# Patient Record
Sex: Male | Born: 1970 | Race: Black or African American | Hispanic: No | Marital: Married | Smoking: Current every day smoker
Health system: Southern US, Community
[De-identification: ages and names within clinical notes are randomized; demographics above are authoritative.]

## PROBLEM LIST (undated history)

## (undated) DIAGNOSIS — I10 Essential (primary) hypertension: Secondary | ICD-10-CM

## (undated) DIAGNOSIS — F1920 Other psychoactive substance dependence, uncomplicated: Secondary | ICD-10-CM

## (undated) DIAGNOSIS — F32A Depression, unspecified: Secondary | ICD-10-CM

## (undated) DIAGNOSIS — F329 Major depressive disorder, single episode, unspecified: Secondary | ICD-10-CM

## (undated) DIAGNOSIS — Z5900 Homelessness unspecified: Secondary | ICD-10-CM

## (undated) DIAGNOSIS — E079 Disorder of thyroid, unspecified: Secondary | ICD-10-CM

## (undated) DIAGNOSIS — M549 Dorsalgia, unspecified: Secondary | ICD-10-CM

## (undated) DIAGNOSIS — R45851 Suicidal ideations: Secondary | ICD-10-CM

## (undated) HISTORY — DX: Major depressive disorder, single episode, unspecified: F32.9

## (undated) HISTORY — DX: Depression, unspecified: F32.A

## (undated) HISTORY — PX: NO PAST SURGERIES: SHX2092

## (undated) HISTORY — DX: Essential (primary) hypertension: I10

---

## 2005-02-19 ENCOUNTER — Emergency Department (HOSPITAL_COMMUNITY): Admission: EM | Admit: 2005-02-19 | Discharge: 2005-02-19 | Payer: Self-pay | Admitting: Emergency Medicine

## 2005-03-18 ENCOUNTER — Ambulatory Visit: Payer: Self-pay | Admitting: Internal Medicine

## 2005-04-27 ENCOUNTER — Ambulatory Visit: Payer: Self-pay | Admitting: *Deleted

## 2005-04-27 ENCOUNTER — Ambulatory Visit: Payer: Self-pay | Admitting: Internal Medicine

## 2005-08-05 ENCOUNTER — Ambulatory Visit: Payer: Self-pay | Admitting: Family Medicine

## 2005-09-14 ENCOUNTER — Ambulatory Visit: Payer: Self-pay | Admitting: Internal Medicine

## 2005-12-06 ENCOUNTER — Ambulatory Visit: Payer: Self-pay | Admitting: Internal Medicine

## 2007-11-14 ENCOUNTER — Ambulatory Visit: Payer: Self-pay | Admitting: Internal Medicine

## 2008-02-13 ENCOUNTER — Ambulatory Visit: Payer: Self-pay | Admitting: Internal Medicine

## 2008-02-13 LAB — CONVERTED CEMR LAB: TSH: 13.719 microintl units/mL — ABNORMAL HIGH (ref 0.350–5.50)

## 2009-04-29 ENCOUNTER — Emergency Department (HOSPITAL_COMMUNITY): Admission: EM | Admit: 2009-04-29 | Discharge: 2009-04-29 | Payer: Self-pay | Admitting: Emergency Medicine

## 2009-09-11 ENCOUNTER — Ambulatory Visit: Payer: Self-pay | Admitting: Psychiatry

## 2009-09-11 ENCOUNTER — Emergency Department (HOSPITAL_COMMUNITY): Admission: EM | Admit: 2009-09-11 | Discharge: 2009-09-11 | Payer: Self-pay | Admitting: Emergency Medicine

## 2009-09-11 ENCOUNTER — Inpatient Hospital Stay (HOSPITAL_COMMUNITY): Admission: AD | Admit: 2009-09-11 | Discharge: 2009-09-14 | Payer: Self-pay | Admitting: Psychiatry

## 2010-02-24 ENCOUNTER — Ambulatory Visit: Payer: Self-pay | Admitting: Psychiatry

## 2010-02-24 ENCOUNTER — Emergency Department (HOSPITAL_COMMUNITY): Admission: EM | Admit: 2010-02-24 | Discharge: 2010-02-25 | Payer: Self-pay | Admitting: Emergency Medicine

## 2010-02-25 ENCOUNTER — Inpatient Hospital Stay (HOSPITAL_COMMUNITY): Admission: EM | Admit: 2010-02-25 | Discharge: 2010-03-01 | Payer: Self-pay | Admitting: Psychiatry

## 2010-06-25 IMAGING — CT CT HEAD W/O CM
1 series · 16 of 30 positions shown, 20 images · non-contrast
Comparison: None

CLINICAL DATA: Fell and hit head.

CT HEAD WITHOUT CONTRAST
TECHNIQUE: Contiguous axial images were obtained from the base of
the skull through the vertex without contrast.

[Series 2: head trauma 4.8 h37s · axial · 0.52mm/px · z∈[+42,+202]mm · 16 of 36 slices shown, 20 images]
[im 2/36  brain]
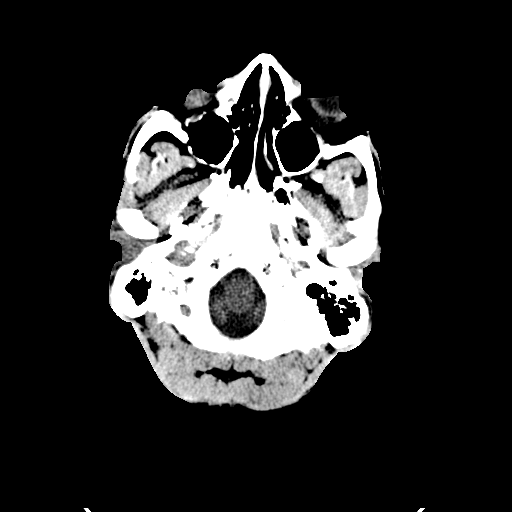
[im 2/36  bone]
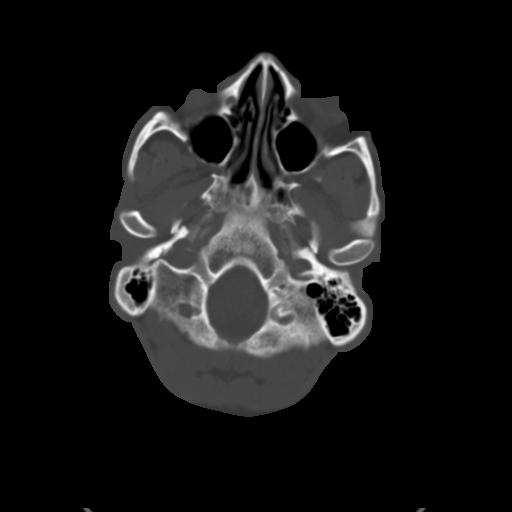
[im 4/36  brain]
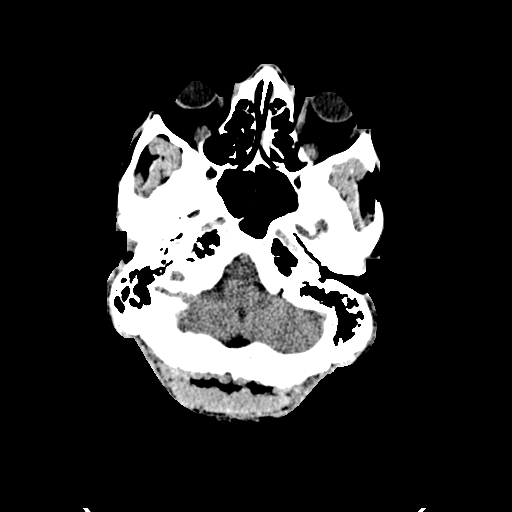
[im 7/36  brain]
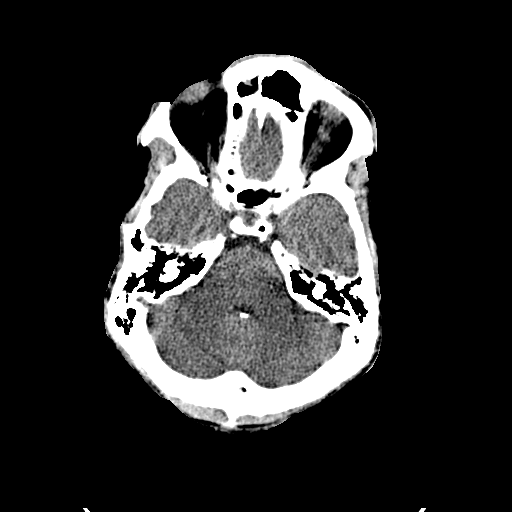
[im 9/36  brain]
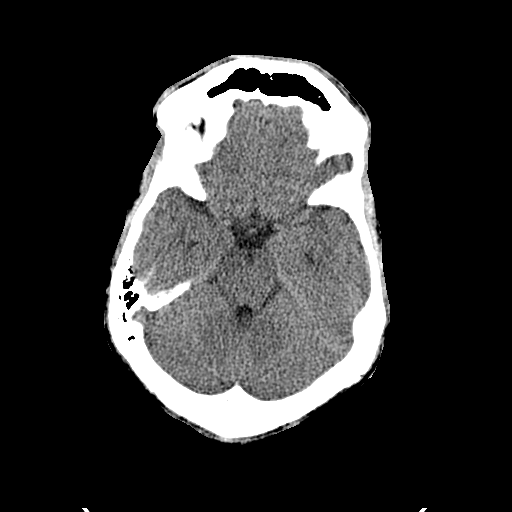
[im 10/36  brain]
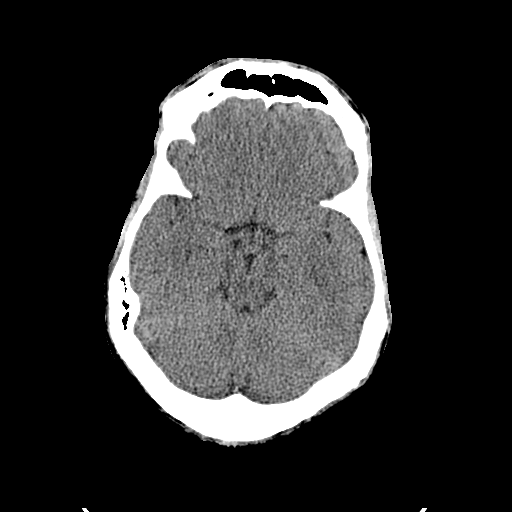
[im 10/36  bone]
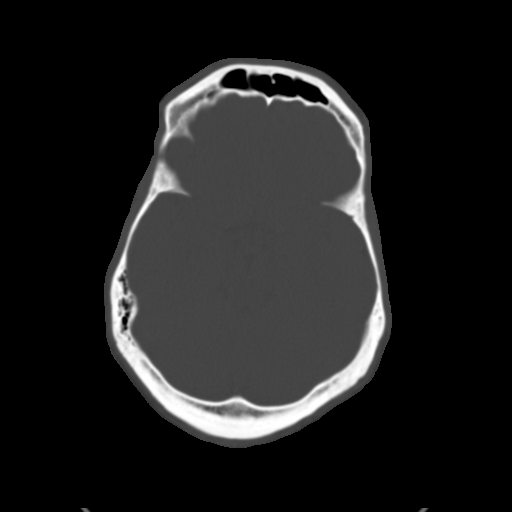
[im 13/36  brain]
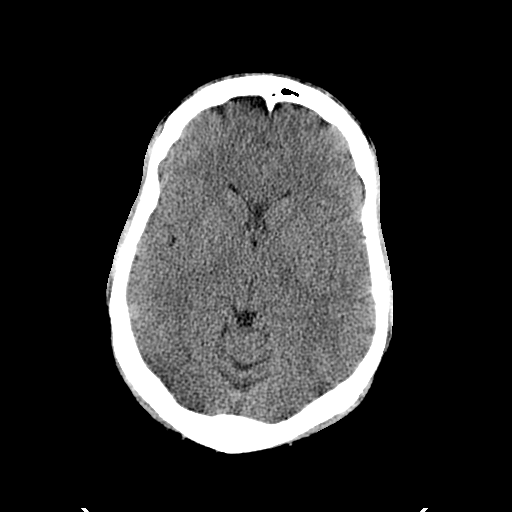
[im 15/36  brain]
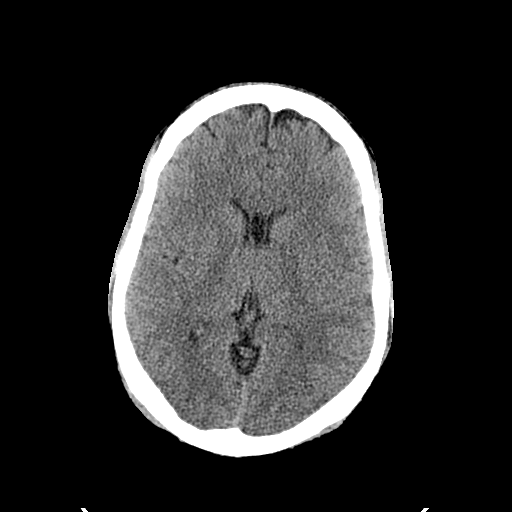
[im 17/36  brain]
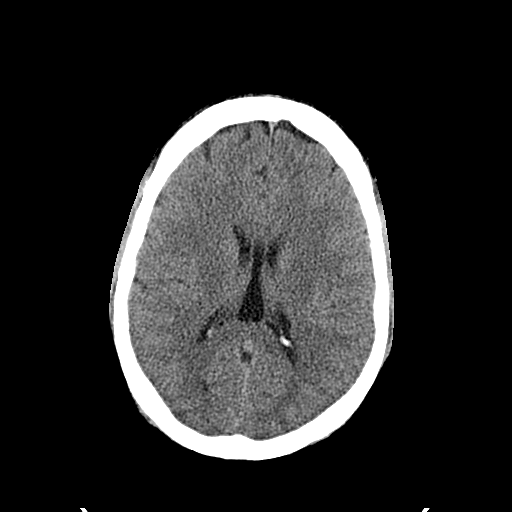
[im 19/36  brain]
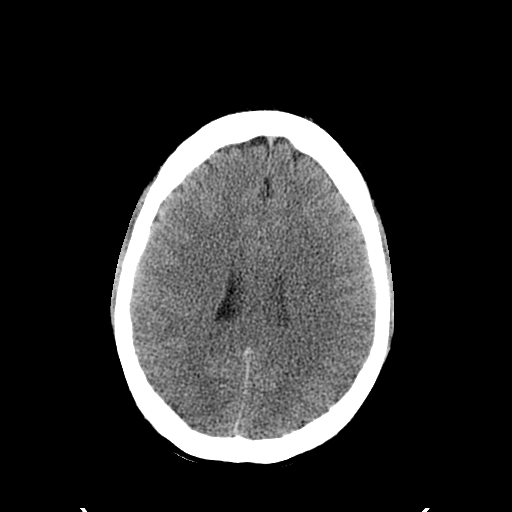
[im 19/36  bone]
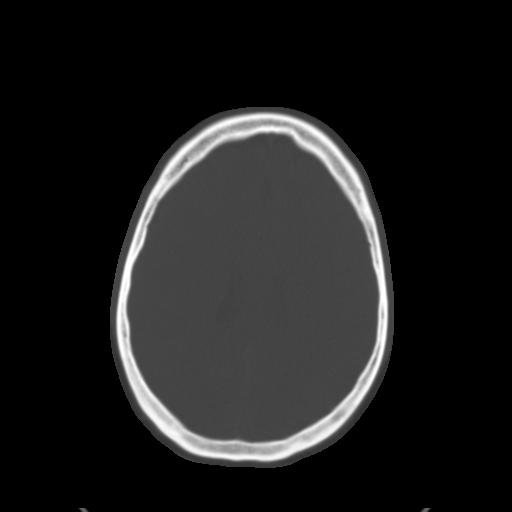
[im 21/36  brain]
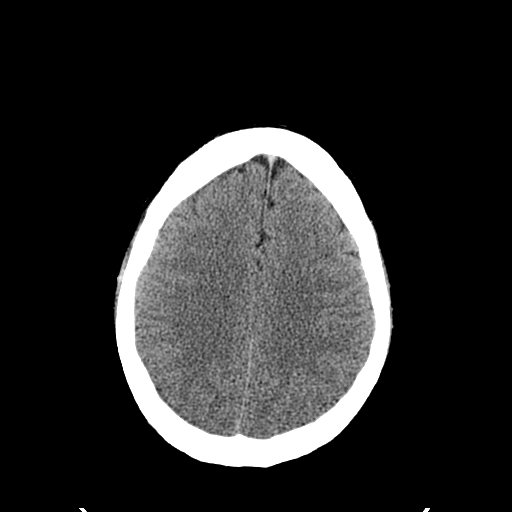
[im 23/36  brain]
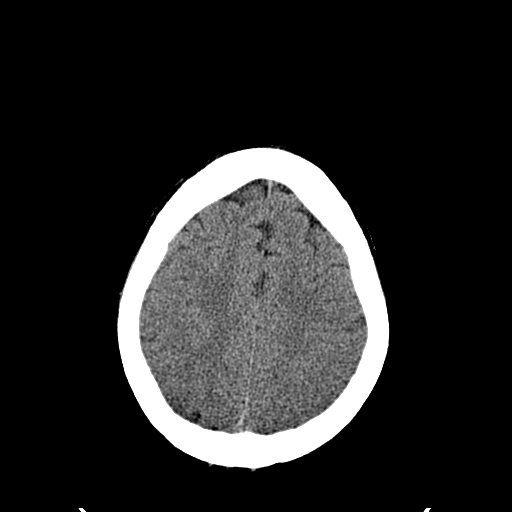
[im 26/36  brain]
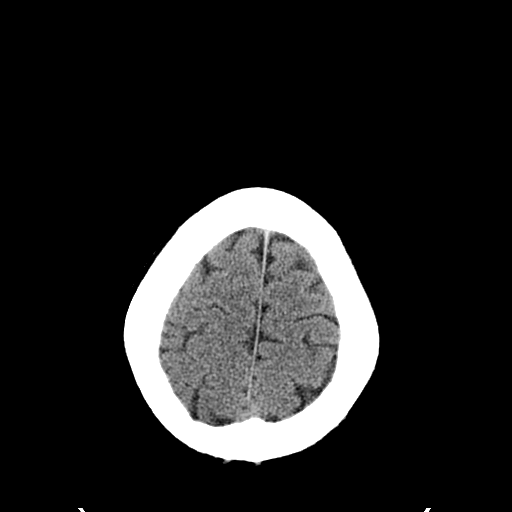
[im 27/36  brain]
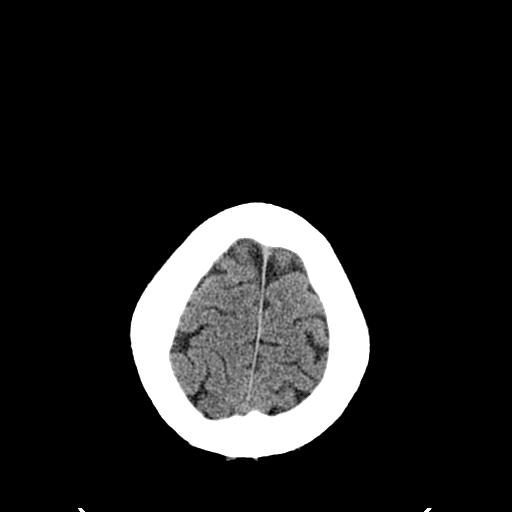
[im 27/36  bone]
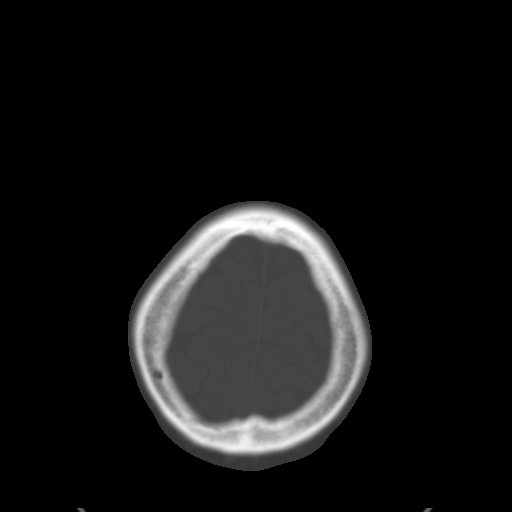
[im 29/36  brain]
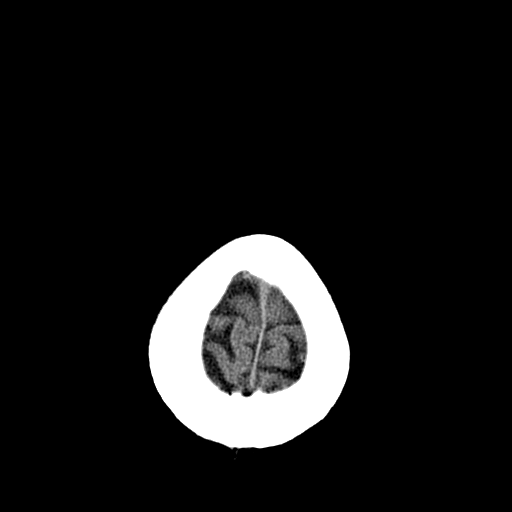
[im 32/36  brain]
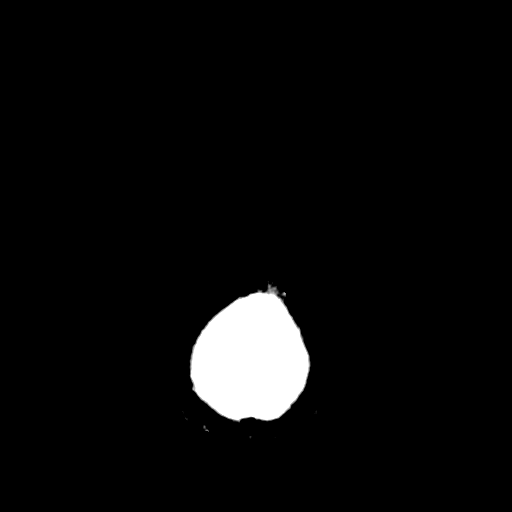
[im 34/36  brain]
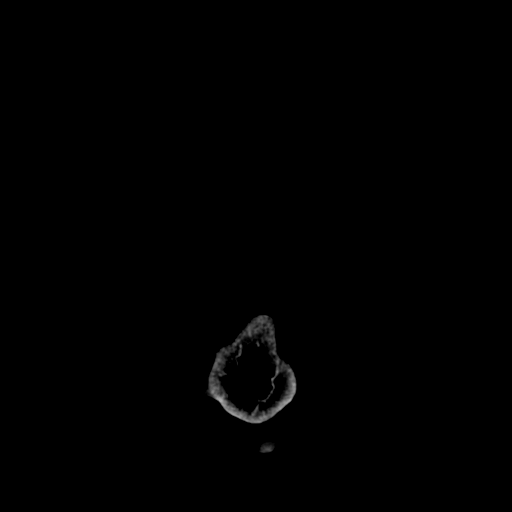

[16 of 30 positions shown; findings below may reference images not displayed]

FINDINGS: There is soft tissue swelling over the right orbit.  No
evidence of associated skull fracture.  The right orbital contents
appear normal.  The intraconal contents appears normal.

No evidence of acute intracranial hemorrhage.  No focal mass
lesion, midline shift or mass effect.  No hydrocephalus.  No
evidence of skull fracture.
IMPRESSION: 1.  No acute intracranial trauma.
2.  Small hematoma above the right orbit.

## 2010-08-12 ENCOUNTER — Encounter (INDEPENDENT_AMBULATORY_CARE_PROVIDER_SITE_OTHER): Payer: Self-pay | Admitting: Family Medicine

## 2010-08-12 LAB — CONVERTED CEMR LAB
Amphetamine Screen, Ur: NEGATIVE
Benzodiazepines.: NEGATIVE
Free T4: 0.21 ng/dL — ABNORMAL LOW (ref 0.80–1.80)
Phencyclidine (PCP): NEGATIVE
TSH: 49.117 microintl units/mL — ABNORMAL HIGH (ref 0.350–4.500)

## 2010-09-23 ENCOUNTER — Encounter (INDEPENDENT_AMBULATORY_CARE_PROVIDER_SITE_OTHER): Payer: Self-pay | Admitting: *Deleted

## 2010-09-23 LAB — CONVERTED CEMR LAB: TSH: 3.069 microintl units/mL (ref 0.350–4.500)

## 2011-01-25 LAB — URINALYSIS, ROUTINE W REFLEX MICROSCOPIC
Glucose, UA: NEGATIVE mg/dL
Leukocytes, UA: NEGATIVE
Nitrite: NEGATIVE

## 2011-01-25 LAB — BASIC METABOLIC PANEL
BUN: 14 mg/dL (ref 6–23)
CO2: 27 mEq/L (ref 19–32)
GFR calc non Af Amer: 52 mL/min — ABNORMAL LOW (ref >60)

## 2011-01-25 LAB — RAPID URINE DRUG SCREEN, HOSP PERFORMED
Amphetamines: NOT DETECTED
Barbiturates: NOT DETECTED
Opiates: NOT DETECTED

## 2011-01-25 LAB — CBC
HCT: 39.3 % (ref 39.0–52.0)
Hemoglobin: 13.3 g/dL (ref 13.0–17.0)
MCHC: 33.9 g/dL (ref 30.0–36.0)
MCV: 104.9 fL — ABNORMAL HIGH (ref 78.0–100.0)
Platelets: 214 10*3/uL (ref 150–400)
WBC: 13.9 10*3/uL — ABNORMAL HIGH (ref 4.0–10.5)

## 2011-01-25 LAB — DIFFERENTIAL
Basophils Absolute: 0 10*3/uL (ref 0.0–0.1)
Basophils Relative: 0 % (ref 0–1)
Eosinophils Absolute: 0 10*3/uL (ref 0.0–0.7)
Lymphocytes Relative: 12 % (ref 12–46)
Monocytes Absolute: 0.3 10*3/uL (ref 0.1–1.0)

## 2011-01-25 LAB — TSH: TSH: 25.34 u[IU]/mL — ABNORMAL HIGH (ref 0.350–4.500)

## 2011-01-25 LAB — URINE MICROSCOPIC-ADD ON

## 2011-01-25 LAB — VITAMIN B12: Vitamin B-12: 361 pg/mL (ref 211–911)

## 2011-01-25 LAB — VALPROIC ACID LEVEL: Valproic Acid Lvl: 81.6 ug/mL (ref 50.0–100.0)

## 2011-01-25 LAB — ETHANOL: Alcohol, Ethyl (B): <5 mg/dL (ref 0–10)

## 2011-01-25 LAB — GLUCOSE, CAPILLARY: Glucose-Capillary: 104 mg/dL — ABNORMAL HIGH (ref 70–99)

## 2011-02-09 LAB — DIFFERENTIAL
Eosinophils Absolute: 0.1 10*3/uL (ref 0.0–0.7)
Lymphocytes Relative: 24 % (ref 12–46)
Lymphs Abs: 2.3 10*3/uL (ref 0.7–4.0)
Monocytes Relative: 4 % (ref 3–12)
Neutro Abs: 6.7 10*3/uL (ref 1.7–7.7)

## 2011-02-09 LAB — CBC
HCT: 37.8 % — ABNORMAL LOW (ref 39.0–52.0)
MCHC: 34.5 g/dL (ref 30.0–36.0)
MCV: 104.5 fL — ABNORMAL HIGH (ref 78.0–100.0)
RBC: 3.62 MIL/uL — ABNORMAL LOW (ref 4.22–5.81)
RDW: 13.6 % (ref 11.5–15.5)
WBC: 9.4 10*3/uL (ref 4.0–10.5)

## 2011-02-09 LAB — RAPID URINE DRUG SCREEN, HOSP PERFORMED: Benzodiazepines: NOT DETECTED

## 2011-02-09 LAB — T3, FREE: T3, Free: 2.2 pg/mL — ABNORMAL LOW (ref 2.3–4.2)

## 2011-02-09 LAB — BASIC METABOLIC PANEL
BUN: 14 mg/dL (ref 6–23)
Calcium: 9.5 mg/dL (ref 8.4–10.5)

## 2011-02-09 LAB — TSH: TSH: 12.915 u[IU]/mL — ABNORMAL HIGH (ref 0.350–4.500)

## 2011-02-14 LAB — DIFFERENTIAL
Eosinophils Absolute: 0.2 10*3/uL (ref 0.0–0.7)
Lymphs Abs: 1.5 10*3/uL (ref 0.7–4.0)
Monocytes Relative: 5 % (ref 3–12)
Neutrophils Relative %: 73 % (ref 43–77)

## 2011-02-14 LAB — POCT CARDIAC MARKERS
CKMB, poc: 3 ng/mL (ref 1.0–8.0)
Troponin i, poc: <0.05 ng/mL (ref 0.00–0.09)

## 2011-02-14 LAB — CBC
Hemoglobin: 13.1 g/dL (ref 13.0–17.0)
MCHC: 34.5 g/dL (ref 30.0–36.0)
MCV: 103.3 fL — ABNORMAL HIGH (ref 78.0–100.0)
RBC: 3.67 MIL/uL — ABNORMAL LOW (ref 4.22–5.81)

## 2011-02-14 LAB — POCT I-STAT, CHEM 8
Creatinine, Ser: 2 mg/dL — ABNORMAL HIGH (ref 0.4–1.5)
Glucose, Bld: 76 mg/dL (ref 70–99)
Hemoglobin: 13.3 g/dL (ref 13.0–17.0)
Potassium: 4.2 mEq/L (ref 3.5–5.1)

## 2011-08-15 ENCOUNTER — Emergency Department (HOSPITAL_COMMUNITY)
Admission: EM | Admit: 2011-08-15 | Discharge: 2011-08-15 | Disposition: A | Discharge status: Home / Self Care | Payer: Self-pay | Attending: Emergency Medicine | Admitting: Emergency Medicine

## 2011-08-15 DIAGNOSIS — L851 Acquired keratosis [keratoderma] palmaris et plantaris: Secondary | ICD-10-CM | POA: Insufficient documentation

## 2011-08-15 DIAGNOSIS — N289 Disorder of kidney and ureter, unspecified: Secondary | ICD-10-CM | POA: Insufficient documentation

## 2011-08-15 DIAGNOSIS — I10 Essential (primary) hypertension: Secondary | ICD-10-CM | POA: Insufficient documentation

## 2011-08-15 DIAGNOSIS — E039 Hypothyroidism, unspecified: Secondary | ICD-10-CM | POA: Insufficient documentation

## 2011-08-15 LAB — RAPID STREP SCREEN (MED CTR MEBANE ONLY): Streptococcus, Group A Screen (Direct): NEGATIVE

## 2011-08-15 LAB — DIFFERENTIAL
Basophils Absolute: 0 10*3/uL (ref 0.0–0.1)
Lymphocytes Relative: 55 % — ABNORMAL HIGH (ref 12–46)
Neutro Abs: 1.6 10*3/uL — ABNORMAL LOW (ref 1.7–7.7)
Neutrophils Relative %: 33 % — ABNORMAL LOW (ref 43–77)

## 2011-08-15 LAB — CBC
HCT: 39.4 % (ref 39.0–52.0)
Hemoglobin: 13.4 g/dL (ref 13.0–17.0)
WBC: 5 10*3/uL (ref 4.0–10.5)

## 2011-08-15 LAB — URINALYSIS, ROUTINE W REFLEX MICROSCOPIC
Glucose, UA: NEGATIVE mg/dL
Hgb urine dipstick: NEGATIVE
Leukocytes, UA: NEGATIVE
Specific Gravity, Urine: 1.026 (ref 1.005–1.030)
Urobilinogen, UA: 1 mg/dL (ref 0.0–1.0)

## 2011-08-15 LAB — POCT I-STAT, CHEM 8
Creatinine, Ser: 1.5 mg/dL — ABNORMAL HIGH (ref 0.50–1.35)
HCT: 42 % (ref 39.0–52.0)
Hemoglobin: 14.3 g/dL (ref 13.0–17.0)
Potassium: 3.8 mEq/L (ref 3.5–5.1)
Sodium: 140 mEq/L (ref 135–145)

## 2011-08-15 LAB — RAPID URINE DRUG SCREEN, HOSP PERFORMED
Amphetamines: NOT DETECTED
Cocaine: NOT DETECTED
Opiates: NOT DETECTED
Tetrahydrocannabinol: POSITIVE — AB

## 2011-08-16 LAB — URINE CULTURE
Culture  Setup Time: 201210081053
Culture: NO GROWTH

## 2013-03-08 ENCOUNTER — Emergency Department (HOSPITAL_COMMUNITY): Payer: Self-pay

## 2013-03-08 ENCOUNTER — Encounter (HOSPITAL_COMMUNITY): Payer: Self-pay | Admitting: *Deleted

## 2013-03-08 ENCOUNTER — Other Ambulatory Visit: Payer: Self-pay

## 2013-03-08 ENCOUNTER — Emergency Department (HOSPITAL_COMMUNITY)
Admission: EM | Admit: 2013-03-08 | Discharge: 2013-03-08 | Disposition: A | Discharge status: Home / Self Care | Payer: Self-pay | Attending: Emergency Medicine | Admitting: Emergency Medicine

## 2013-03-08 DIAGNOSIS — R0602 Shortness of breath: Secondary | ICD-10-CM | POA: Insufficient documentation

## 2013-03-08 DIAGNOSIS — E079 Disorder of thyroid, unspecified: Secondary | ICD-10-CM | POA: Insufficient documentation

## 2013-03-08 DIAGNOSIS — R079 Chest pain, unspecified: Secondary | ICD-10-CM | POA: Insufficient documentation

## 2013-03-08 DIAGNOSIS — Z79899 Other long term (current) drug therapy: Secondary | ICD-10-CM | POA: Insufficient documentation

## 2013-03-08 DIAGNOSIS — F17200 Nicotine dependence, unspecified, uncomplicated: Secondary | ICD-10-CM | POA: Insufficient documentation

## 2013-03-08 HISTORY — DX: Dorsalgia, unspecified: M54.9

## 2013-03-08 HISTORY — DX: Disorder of thyroid, unspecified: E07.9

## 2013-03-08 LAB — BASIC METABOLIC PANEL
BUN: 18 mg/dL (ref 6–23)
Chloride: 104 mEq/L (ref 96–112)
GFR calc Af Amer: >90 mL/min (ref >90)
GFR calc non Af Amer: 86 mL/min — ABNORMAL LOW (ref >90)
Potassium: 4.1 mEq/L (ref 3.5–5.1)
Sodium: 140 mEq/L (ref 135–145)

## 2013-03-08 LAB — POCT I-STAT TROPONIN I
Troponin i, poc: 0 ng/mL (ref 0.00–0.08)
Troponin i, poc: 0 ng/mL (ref 0.00–0.08)

## 2013-03-08 LAB — CBC
HCT: 37.5 % — ABNORMAL LOW (ref 39.0–52.0)
MCHC: 35.2 g/dL (ref 30.0–36.0)
RDW: 11.7 % (ref 11.5–15.5)
WBC: 3.9 10*3/uL — ABNORMAL LOW (ref 4.0–10.5)

## 2013-03-08 LAB — PRO B NATRIURETIC PEPTIDE: Pro B Natriuretic peptide (BNP): 7.7 pg/mL (ref 0–125)

## 2013-03-08 MED ORDER — PANTOPRAZOLE SODIUM 40 MG PO TBEC
40.0000 mg | DELAYED_RELEASE_TABLET | Freq: Once | ORAL | Status: AC
  Administered 2013-03-08: 40 mg via ORAL
  Filled 2013-03-08: qty 1

## 2013-03-08 MED ORDER — OMEPRAZOLE 20 MG PO CPDR: 40.0000 mg | DELAYED_RELEASE_CAPSULE | Freq: Every day | ORAL | Status: DC

## 2013-03-08 NOTE — ED Notes (Signed)
Pt dc to home. Pt sts understanding to dc instructions. Pt ambulatory to exit without difficulty.  Pt denies need for w/c.  

## 2013-03-08 NOTE — ED Provider Notes (Signed)
History     CSN: [id number]  Arrival date & time 03/08/13  1254   First MD Initiated Contact with Patient 03/08/13 1454      Chief Complaint  Patient presents with  . Chest Pain    (Consider location/radiation/quality/duration/timing/severity/associated sxs/prior treatment) HPI Comments: Patient presents with chest pain. He has had one episode last night and one episode today. He describes it as a soreness in his throat it radiates into the center of his chest. He states that he's recently started eating late at night and the pain tends to wake him up when he is sleeping. He states he gets better when he sits up. He states that he feels slightly short of breath when it happens and he gets a little clammy feeling but no actual diaphoresis.he states that the episodes have been going on for about a month. He states that each episode lasts only about 15-20 seconds. He states is relieved when he sits up. He feels like he might be related to his change in eating habits. He denies any shortness of breath or chest pain on exertion. He denies any past history of heart problems. He denies a family history of early heart disease. He denies any hypertension hyperlipidemia or diabetes. He does smoke cigarettes.  Patient is a 42 y.o. male presenting with chest pain.  Chest Pain Associated symptoms: shortness of breath   Associated symptoms: no abdominal pain, no back pain, no cough, no diaphoresis, no dizziness, no fatigue, no fever, no headache, no nausea, no numbness, not vomiting and no weakness     Past Medical History  Diagnosis Date  . Thyroid disease   . Back pain     History reviewed. No pertinent past surgical history.  History reviewed. No pertinent family history.  History  Substance Use Topics  . Smoking status: Current Every Day Smoker    Types: Cigarettes  . Smokeless tobacco: Not on file  . Alcohol Use: No      Review of Systems  Constitutional: Negative for fever, chills,  diaphoresis and fatigue.  HENT: Negative for congestion, rhinorrhea and sneezing.   Eyes: Negative.   Respiratory: Positive for shortness of breath. Negative for cough and chest tightness.   Cardiovascular: Positive for chest pain. Negative for leg swelling.  Gastrointestinal: Negative for nausea, vomiting, abdominal pain, diarrhea and blood in stool.  Genitourinary: Negative for frequency, hematuria, flank pain and difficulty urinating.  Musculoskeletal: Negative for back pain and arthralgias.  Skin: Negative for rash.  Neurological: Negative for dizziness, speech difficulty, weakness, numbness and headaches.    Allergies  Review of patient's allergies indicates no known allergies.  Home Medications   Current Outpatient Rx  Name  Route  Sig  Dispense  Refill  . GABAPENTIN PO   Oral   Take 1 capsule by mouth 3 (three) times daily as needed (for back pain).          Marland Kitchen levothyroxine (SYNTHROID, LEVOTHROID) 200 MCG tablet   Oral   Take 200 mcg by mouth daily before breakfast.         . omeprazole (PRILOSEC) 20 MG capsule   Oral   Take 2 capsules (40 mg total) by mouth daily.   30 capsule   0     BP 126/78  Pulse 57  Temp(Src) 97.4 F (36.3 C) (Oral)  Resp 15  SpO2 100%  Physical Exam  Constitutional: He is oriented to person, place, and time. He appears well-developed and well-nourished.  HENT:  Head: Normocephalic and atraumatic.  Eyes: Pupils are equal, round, and reactive to light.  Neck: Normal range of motion. Neck supple.  Cardiovascular: Normal rate, regular rhythm and normal heart sounds.   Pulmonary/Chest: Effort normal and breath sounds normal. No respiratory distress. He has no wheezes. He has no rales. He exhibits no tenderness.  Abdominal: Soft. Bowel sounds are normal. There is no tenderness. There is no rebound and no guarding.  Musculoskeletal: Normal range of motion. He exhibits no edema and no tenderness.  Lymphadenopathy:    He has no cervical  adenopathy.  Neurological: He is alert and oriented to person, place, and time.  Skin: Skin is warm and dry. No rash noted.  Psychiatric: He has a normal mood and affect.    ED Course  Procedures (including critical care time)  Results for orders placed during the hospital encounter of 03/08/13  CBC      Result Value Range   WBC 3.9 (*) 4.0 - 10.5 K/uL   RBC 3.78 (*) 4.22 - 5.81 MIL/uL   Hemoglobin 13.2  13.0 - 17.0 g/dL   HCT 16.1 (*) 09.6 - 04.5 %   MCV 99.2  78.0 - 100.0 fL   MCH 34.9 (*) 26.0 - 34.0 pg   MCHC 35.2  30.0 - 36.0 g/dL   RDW 40.9  81.1 - 91.4 %   Platelets 210  150 - 400 K/uL  BASIC METABOLIC PANEL      Result Value Range   Sodium 140  135 - 145 mEq/L   Potassium 4.1  3.5 - 5.1 mEq/L   Chloride 104  96 - 112 mEq/L   CO2 25  19 - 32 mEq/L   Glucose, Bld 101 (*) 70 - 99 mg/dL   BUN 18  6 - 23 mg/dL   Creatinine, Ser 7.82  0.50 - 1.35 mg/dL   Calcium 9.8  8.4 - 95.6 mg/dL   GFR calc non Af Amer 86 (*) >90 mL/min   GFR calc Af Amer >90  >90 mL/min  PRO B NATRIURETIC PEPTIDE      Result Value Range   Pro B Natriuretic peptide (BNP) 7.7  0 - 125 pg/mL  POCT I-STAT TROPONIN I      Result Value Range   Troponin i, poc 0.00  0.00 - 0.08 ng/mL   Comment 3           POCT I-STAT TROPONIN I      Result Value Range   Troponin i, poc 0.00  0.00 - 0.08 ng/mL   Comment 3            Dg Chest 2 View  03/08/2013  *RADIOLOGY REPORT*  Clinical Data: Chest pain.  CHEST - 2 VIEW  Comparison: None.  Findings: Cardiomediastinal silhouette appears normal.  No acute pulmonary disease is noted.  Bony thorax is intact.  IMPRESSION: No acute cardiopulmonary abnormality seen.   Original Report Authenticated By: Lupita Raider.,  M.D.      Date: 03/08/2013  Rate: 75  Rhythm: normal sinus rhythm  QRS Axis: normal  Intervals: normal  ST/T Wave abnormalities: normal  Conduction Disutrbances:none  Narrative Interpretation:   Old EKG Reviewed: none available    1. Chest pain        MDM  Patient symptoms sound suggestive of gastroesophageal reflux disease. His discomfort is worse on lying down and better sitting up. His symptoms only last 15-20 seconds and he has no other suggestions of acute coronary syndrome. He's  had 2 negative troponins. His EKG did not show ischemic changes. I will go ahead and start him on an H2 blocker and I did advise him to have close followup with his primary care physician. He does have a primary care physician. I advised to followup on Monday or return here if his symptoms worsen over the weekend.        Rolan Bucco, MD 03/08/13 [phone]

## 2013-03-08 NOTE — ED Notes (Signed)
Pt reports episodes of cp over the past month, became more frequent yesterday. Having mid chest pressure, sob and diaphoresis. ekg being done at triage. No acute distress noted at this time.

## 2013-03-08 NOTE — ED Notes (Signed)
Lab at bedside

## 2014-02-10 ENCOUNTER — Ambulatory Visit: Payer: Self-pay | Admitting: Family Medicine

## 2014-02-11 ENCOUNTER — Telehealth: Payer: Self-pay

## 2014-02-11 ENCOUNTER — Ambulatory Visit (INDEPENDENT_AMBULATORY_CARE_PROVIDER_SITE_OTHER): Payer: PRIVATE HEALTH INSURANCE | Admitting: Family Medicine

## 2014-02-11 VITALS — BP 140/96 | HR 59 | Temp 97.6°F | Resp 18 | Ht 76.0 in | Wt 220.0 lb

## 2014-02-11 DIAGNOSIS — E039 Hypothyroidism, unspecified: Secondary | ICD-10-CM

## 2014-02-11 DIAGNOSIS — Z0000 Encounter for general adult medical examination without abnormal findings: Secondary | ICD-10-CM

## 2014-02-11 DIAGNOSIS — M5431 Sciatica, right side: Secondary | ICD-10-CM

## 2014-02-11 DIAGNOSIS — R21 Rash and other nonspecific skin eruption: Secondary | ICD-10-CM

## 2014-02-11 LAB — POCT CBC
Granulocyte percent: 46 %G (ref 37–80)
HCT, POC: 45.5 % (ref 43.5–53.7)
Hemoglobin: 14.4 g/dL (ref 14.1–18.1)
Lymph, poc: 1.9 (ref 0.6–3.4)
MCH, POC: 33.7 pg — AB (ref 27–31.2)
MCHC: 31.6 g/dL — AB (ref 31.8–35.4)
MCV: 106.6 fL — AB (ref 80–97)
MID (cbc): 0.4 (ref 0–0.9)
MPV: 9.5 fL (ref 0–99.8)
POC Granulocyte: 2 (ref 2–6.9)
POC LYMPH PERCENT: 45.3 %L (ref 10–50)
POC MID %: 8.7 %M (ref 0–12)
Platelet Count, POC: 223 10*3/uL (ref 142–424)
RBC: 4.27 M/uL — AB (ref 4.69–6.13)
RDW, POC: 11.9 %
WBC: 4.3 10*3/uL — AB (ref 4.6–10.2)

## 2014-02-11 LAB — COMPREHENSIVE METABOLIC PANEL
ALT: 22 U/L (ref 0–53)
AST: 30 U/L (ref 0–37)
Albumin: 5 g/dL (ref 3.5–5.2)
Alkaline Phosphatase: 74 U/L (ref 39–117)
BUN: 15 mg/dL (ref 6–23)
CO2: 28 mEq/L (ref 19–32)
Calcium: 10.2 mg/dL (ref 8.4–10.5)
Chloride: 101 mEq/L (ref 96–112)
Creat: 1.05 mg/dL (ref 0.50–1.35)
Glucose, Bld: 86 mg/dL (ref 70–99)
Potassium: 4 mEq/L (ref 3.5–5.3)
Sodium: 137 mEq/L (ref 135–145)
Total Bilirubin: 0.7 mg/dL (ref 0.2–1.2)
Total Protein: 7.5 g/dL (ref 6.0–8.3)

## 2014-02-11 LAB — POCT URINALYSIS DIPSTICK
Bilirubin, UA: NEGATIVE
Blood, UA: NEGATIVE
Glucose, UA: NEGATIVE
Ketones, UA: NEGATIVE
Leukocytes, UA: NEGATIVE
Nitrite, UA: NEGATIVE
Protein, UA: NEGATIVE
Spec Grav, UA: 1.015
Urobilinogen, UA: 0.2
pH, UA: 6

## 2014-02-11 LAB — LIPID PANEL
Cholesterol: 165 mg/dL (ref 0–200)
HDL: 76 mg/dL (ref >39)
LDL Cholesterol: 73 mg/dL (ref 0–99)
Total CHOL/HDL Ratio: 2.2 Ratio
Triglycerides: 80 mg/dL (ref <150)
VLDL: 16 mg/dL (ref 0–40)

## 2014-02-11 MED ORDER — PREDNISONE 20 MG PO TABS: ORAL_TABLET | ORAL | Status: DC

## 2014-02-11 NOTE — Patient Instructions (Addendum)
Sciatica Sciatica is pain, weakness, numbness, or tingling along the path of the sciatic nerve. The nerve starts in the lower back and runs down the back of each leg. The nerve controls the muscles in the lower leg and in the back of the knee, while also providing sensation to the back of the thigh, lower leg, and the sole of your foot. Sciatica is a symptom of another medical condition. For instance, nerve damage or certain conditions, such as a herniated disk or bone spur on the spine, pinch or put pressure on the sciatic nerve. This causes the pain, weakness, or other sensations normally associated with sciatica. Generally, sciatica only affects one side of the body. CAUSES   Herniated or slipped disc.  Degenerative disk disease.  A pain disorder involving the narrow muscle in the buttocks (piriformis syndrome).  Pelvic injury or fracture.  Pregnancy.  Tumor (rare). SYMPTOMS  Symptoms can vary from mild to very severe. The symptoms usually travel from the low back to the buttocks and down the back of the leg. Symptoms can include:  Mild tingling or dull aches in the lower back, leg, or hip.  Numbness in the back of the calf or sole of the foot.  Burning sensations in the lower back, leg, or hip.  Sharp pains in the lower back, leg, or hip.  Leg weakness.  Severe back pain inhibiting movement. These symptoms may get worse with coughing, sneezing, laughing, or prolonged sitting or standing. Also, being overweight may worsen symptoms. DIAGNOSIS  Your caregiver will perform a physical exam to look for common symptoms of sciatica. He or she may ask you to do certain movements or activities that would trigger sciatic nerve pain. Other tests may be performed to find the cause of the sciatica. These may include:  Blood tests.  X-rays.  Imaging tests, such as an MRI or CT scan. TREATMENT  Treatment is directed at the cause of the sciatic pain. Sometimes, treatment is not necessary  and the pain and discomfort goes away on its own. If treatment is needed, your caregiver may suggest:  Over-the-counter medicines to relieve pain.  Prescription medicines, such as anti-inflammatory medicine, muscle relaxants, or narcotics.  Applying heat or ice to the painful area.  Steroid injections to lessen pain, irritation, and inflammation around the nerve.  Reducing activity during periods of pain.  Exercising and stretching to strengthen your abdomen and improve flexibility of your spine. Your caregiver may suggest losing weight if the extra weight makes the back pain worse.  Physical therapy.  Surgery to eliminate what is pressing or pinching the nerve, such as a bone spur or part of a herniated disk. HOME CARE INSTRUCTIONS   Only take over-the-counter or prescription medicines for pain or discomfort as directed by your caregiver.  Apply ice to the affected area for 20 minutes, 3 4 times a day for the first 48 72 hours. Then try heat in the same way.  Exercise, stretch, or perform your usual activities if these do not aggravate your pain.  Attend physical therapy sessions as directed by your caregiver.  Keep all follow-up appointments as directed by your caregiver.  Do not wear high heels or shoes that do not provide proper support.  Check your mattress to see if it is too soft. A firm mattress may lessen your pain and discomfort. SEEK IMMEDIATE MEDICAL CARE IF:   You lose control of your bowel or bladder (incontinence).  You have increasing weakness in the lower back,   pelvis, buttocks, or legs.  You have redness or swelling of your back.  You have a burning sensation when you urinate.  You have pain that gets worse when you lie down or awakens you at night.  Your pain is worse than you have experienced in the past.  Your pain is lasting longer than 4 weeks.  You are suddenly losing weight without reason. MAKE SURE YOU:  Understand these  instructions.  Will watch your condition.  Will get help right away if you are not doing well or get worse. Document Released: 10/18/2001 Document Revised: 04/24/2012 Document Reviewed: 03/04/2012 [facility] Patient Information 2014 [place]. Health Maintenance, Males A healthy lifestyle and preventative care can promote health and wellness.  Maintain regular health, dental, and eye exams.  Eat a healthy diet. Foods like vegetables, fruits, whole grains, low-fat dairy products, and lean protein foods contain the nutrients you need and are low in calories. Decrease your intake of foods high in solid fats, added sugars, and salt. Get information about a proper diet from your health care provider, if necessary.  Regular physical exercise is one of the most important things you can do for your health. Most adults should get at least 150 minutes of moderate-intensity exercise (any activity that increases your heart rate and causes you to sweat) each week. In addition, most adults need muscle-strengthening exercises on 2 or more days a week.   Maintain a healthy weight. The body mass index (BMI) is a screening tool to identify possible weight problems. It provides an estimate of body fat based on height and weight. Your health care provider can find your BMI and can help you achieve or maintain a healthy weight. For males 20 years and older:  A BMI below 18.5 is considered underweight.  A BMI of 18.5 to 24.9 is normal.  A BMI of 25 to 29.9 is considered overweight.  A BMI of 30 and above is considered obese.  Maintain normal blood lipids and cholesterol by exercising and minimizing your intake of saturated fat. Eat a balanced diet with plenty of fruits and vegetables. Blood tests for lipids and cholesterol should begin at age 15 and be repeated every 5 years. If your lipid or cholesterol levels are high, you are over 50, or you are at high risk for heart disease, you may need your  cholesterol levels checked more frequently.Ongoing high lipid and cholesterol levels should be treated with medicines, if diet and exercise are not working.  If you smoke, find out from your health care provider how to quit. If you do not use tobacco, do not start.  Lung cancer screening is recommended for adults aged 42 80 years who are at high risk for developing lung cancer because of a history of smoking. A yearly low-dose CT scan of the lungs is recommended for people who have at least a 30-pack-year history of smoking and are a current smoker or have quit within the past 15 years. A pack year of smoking is smoking an average of 1 pack of cigarettes a day for 1 year (for example, a 30-pack-year history of smoking could mean smoking 1 pack a day for 30 years or 2 packs a day for 15 years). Yearly screening should continue until the smoker has stopped smoking for at least 15 years. Yearly screening should be stopped for people who develop a health problem that would prevent them from having lung cancer treatment.  If you choose to drink alcohol, do not have  more than 2 drinks per day. One drink is considered to be 12 oz (360 mL) of beer, 5 oz (150 mL) of wine, or 1.5 oz (45 mL) of liquor.  Avoid use of street drugs. Do not share needles with anyone. Ask for help if you need support or instructions about stopping the use of drugs.  High blood pressure causes heart disease and increases the risk of stroke. Blood pressure should be checked at least every 1 2 years. Ongoing high blood pressure should be treated with medicines if weight loss and exercise are not effective.  If you are 27 44 years old, ask your health care provider if you should take aspirin to prevent heart disease.  Diabetes screening involves taking a blood sample to check your fasting blood sugar level. This should be done once every 3 years after age 54, if you are at a normal weight and without risk factors for diabetes. Testing  should be considered at a younger age or be carried out more frequently if you are overweight and have at least 1 risk factor for diabetes.  Colorectal cancer can be detected and often prevented. Most routine colorectal cancer screening begins at the age of 68 and continues through age 72. However, your health care provider may recommend screening at an earlier age if you have risk factors for colon cancer. On a yearly basis, your health care provider may provide home test kits to check for hidden blood in the stool. A small camera at the end of a tube may be used to directly examine the colon (sigmoidoscopy or colonoscopy) to detect the earliest forms of colorectal cancer. Talk to your health care provider about this at age 55, when routine screening begins. A direct exam of the colon should be repeated every 5 10 years through age 22, unless early forms of pre-cancerous polyps or small growths are found.  People who are at an increased risk for hepatitis B should be screened for this virus. You are considered at high risk for hepatitis B if:  You were born in a country where hepatitis B occurs often. Talk with your health care provider about which countries are considered high-risk.  Your parents were born in a high-risk country and you have not received a shot to protect against hepatitis B (hepatitis B vaccine).  You have HIV or AIDS.  You use needles to inject street drugs.  You live with, or have sex with, someone who has hepatitis B.  You are a man who has sex with other men (MSM).  You get hemodialysis treatment.  You take certain medicines for conditions like cancer, organ transplantation, and autoimmune conditions.  Hepatitis C blood testing is recommended for all people born from [birth year] through 1966 and any individual with known risk factors for hepatitis C.  Healthy men should no longer receive prostate-specific antigen (PSA) blood tests as part of routine cancer screening. Talk to  your health care provider about prostate cancer screening.  Testicular cancer screening is not recommended for adolescents or adult males who have no symptoms. Screening includes self-exam, a health care provider exam, and other screening tests. Consult with your health care provider about any symptoms you have or any concerns you have about testicular cancer.  Practice safe sex. Use condoms and avoid high-risk sexual practices to reduce the spread of sexually transmitted infections (STIs).  Use sunscreen. Apply sunscreen liberally and repeatedly throughout the day. You should seek shade when your shadow is shorter than you.  Protect yourself by wearing long sleeves, pants, a wide-brimmed hat, and sunglasses year round, whenever you are outdoors.  Tell your health care provider of new moles or changes in moles, especially if there is a change in shape or color. Also tell your provider if a mole is larger than the size of a pencil eraser.  A one-time screening for abdominal aortic aneurysm (AAA) and surgical repair of large AAAs by ultrasound is recommended for men aged 43 75 years who are current or former smokers.  Stay current with your vaccines (immunizations). Document Released: 04/21/2008 Document Revised: 08/14/2013 Document Reviewed: 03/21/2011 [facility] Patient Information 2014 [place], [place].

## 2014-02-11 NOTE — Progress Notes (Signed)
Patient ID: Johnathan Baker MRN: [id number], DOB: Dec 09, 1970 43 y.o. Date of Encounter: 02/11/2014, 1:37 PM  Primary Physician: No primary provider on file.  Chief Complaint: Physical (CPE)  HPI: 43 y.o. y/o male with history noted below here for several problems   Married, one son, works at [employer].  1. H/o hyperthyroidism with thyroid surgery and subsequent thyroid replacement 2. Right sciatica since 2010 3. Several hyperpigmented 1/2 cm lesions, one on each thigh and one on medial left ankle, one of which dates back to childhood and one which developed on inside of left ankle. 4. Eyes don't feel right.  Review of Systems: Consitutional: No fever, chills, fatigue, night sweats, lymphadenopathy, or weight changes. Eyes: No visual changes, eye redness, or discharge.  Do not feel right ENT/Mouth: Ears: No otalgia, tinnitus, hearing loss, discharge. Nose: No congestion, rhinorrhea, sinus pain, or epistaxis. Throat: No sore throat, post nasal drip, or teeth pain. Cardiovascular: No CP, palpitations, diaphoresis, DOE, edema, orthopnea, PND. Respiratory: No cough, hemoptysis, SOB, or wheezing. Gastrointestinal: No anorexia, dysphagia, reflux, pain, nausea, vomiting, hematemesis, diarrhea, constipation, BRBPR, or melena. Genitourinary: No dysuria, frequency, urgency, hematuria, incontinence, nocturia, decreased urinary stream, discharge, impotence, or testicular pain/masses. Musculoskeletal: No decreased ROM, myalgias, stiffness, joint swelling, or weakness. Skin: No rash, erythema, lesion changes, pain, warmth, jaundice, or pruritis. Neurological: No headache, dizziness, syncope, seizures, tremors, memory loss, coordination problems, or paresthesias.  Thinks he may have lost strength in right thigh Psychological: No anxiety, depression, hallucinations, SI/HI. Endocrine: No fatigue, polydipsia, polyphagia, polyuria, or known diabetes. All other systems were reviewed and are  otherwise negative.  Past Medical History  Diagnosis Date  . Thyroid disease   . Back pain   . Depression   . Hypertension      History reviewed. No pertinent past surgical history.  Home Meds:  Prior to Admission medications   Medication Sig Start Date End Date Taking? Authorizing Provider  GABAPENTIN PO Take 1 capsule by mouth 3 (three) times daily as needed (for back pain).    Yes Historical Provider, MD  levothyroxine (SYNTHROID, LEVOTHROID) 200 MCG tablet Take 200 mcg by mouth daily before breakfast.   Yes Historical Provider, MD  omeprazole (PRILOSEC) 20 MG capsule Take 2 capsules (40 mg total) by mouth daily. 03/08/13   Malvin Johns, MD    Allergies: No Known Allergies  History   Social History  . Marital Status: Single    Spouse Name: N/A    Number of Children: N/A  . Years of Education: N/A   Occupational History  . Not on file.   Social History Main Topics  . Smoking status: Current Every Day Smoker    Types: Cigarettes  . Smokeless tobacco: Not on file  . Alcohol Use: No  . Drug Use: No  . Sexual Activity: Not on file   Other Topics Concern  . Not on file   Social History Narrative  . No narrative on file    History reviewed. No pertinent family history.  Physical Exam: Blood pressure 140/96, pulse 59, temperature 97.6 F (36.4 C), temperature source Oral, resp. rate 18, height 6\' 4"  (1.93 m), weight 220 lb (99.791 kg), SpO2 99.00%.  General: Well developed, well nourished, in no acute distress. HEENT: Normocephalic, atraumatic. Conjunctiva pink, sclera non-icteric. Pupils 2 mm constricting to 1 mm, round, regular, and equally reactive to light and accomodation. EOMI but there is slight proptosis. Internal auditory canal clear. TMs with good cone of light and without pathology. Nasal  mucosa pink. Nares are without discharge. No sinus tenderness. Oral mucosa pink. Dentition poor. Pharynx without exudate.   Neck: Supple. Trachea midline. No thyromegaly.  Full ROM. No lymphadenopathy. Lungs: Clear to auscultation bilaterally without wheezes, rales, or rhonchi. Breathing is of normal effort and unlabored. Cardiovascular: RRR with S1 S2. No murmurs, rubs, or gallops appreciated. Distal pulses 2+ symmetrically. No carotid or abdominal bruits Abdomen: Soft, non-tender, non-distended with normoactive bowel sounds. No hepatosplenomegaly or masses. No rebound/guarding. No CVA tenderness. Without hernias.   Genitourinary:  circumcised male. No penile lesions. Testes descended bilaterally, and smooth without tenderness or masses.  Musculoskeletal: Full range of motion and 5/5 strength throughout. Without swelling, atrophy, tenderness, crepitus, or warmth. Extremities without clubbing, cyanosis, or edema. Calves supple. Skin: Warm and moist without erythema, ecchymosis, wounds, or rash. Neuro: A+Ox3. CN II-XII grossly intact. Moves all extremities spontaneously. Full sensation throughout. Normal gait. DTR 2+ throughout upper and lower extremities. Finger to nose intact. Psych:  Responds to questions appropriately with a normal affect.   Studies: CBC, CMET, Lipid, PSA, TSH all pending. UA:   Assessment/Plan:  43 y.o. y/o  male here for CPE -Sciatica of right side without back pain - Plan: Ambulatory referral to Neurosurgery  Unspecified hypothyroidism - Plan: TSH, T4, Free  Rash - Plan: Ambulatory referral to Dermatology  Annual physical exam - Plan: Comprehensive metabolic panel, Lipid panel, POCT urinalysis dipstick, PSA  Patient phone:  [phone] Wife phone:  [phone]  Signed, Robyn Haber, MD 02/11/2014 1:37 PM

## 2014-02-12 LAB — T4, FREE: Free T4: 1.35 ng/dL (ref 0.80–1.80)

## 2014-02-12 LAB — PSA: PSA: 2.41 ng/mL (ref <=4.00)

## 2014-02-12 LAB — TSH: TSH: 1.716 u[IU]/mL (ref 0.350–4.500)

## 2014-02-20 ENCOUNTER — Telehealth: Payer: Self-pay

## 2014-02-20 DIAGNOSIS — E039 Hypothyroidism, unspecified: Secondary | ICD-10-CM

## 2014-02-20 MED ORDER — LEVOTHYROXINE SODIUM 200 MCG PO TABS: 200.0000 ug | ORAL_TABLET | Freq: Every day | ORAL | Status: DC

## 2014-02-20 NOTE — Telephone Encounter (Signed)
Patient called stated his medication Levothyroxine 200 MCG was not called into Pharmacy [facility] on [address].

## 2014-02-20 NOTE — Telephone Encounter (Signed)
Patient seen on 4/7 TSH drawn, no mention of med refill.  Please advise.

## 2014-02-20 NOTE — Telephone Encounter (Signed)
Sent in

## 2014-02-21 NOTE — Telephone Encounter (Signed)
Spoke to patient.  Advised him his medication was sent to the pharmacy.

## 2014-03-06 ENCOUNTER — Encounter: Payer: Self-pay | Admitting: Family Medicine

## 2014-05-04 IMAGING — CR DG CHEST 2V
2 series · 2 of 2 positions shown · non-contrast
Comparison: None.

CLINICAL DATA: Chest pain.

CHEST - 2 VIEW

[w chest pa]
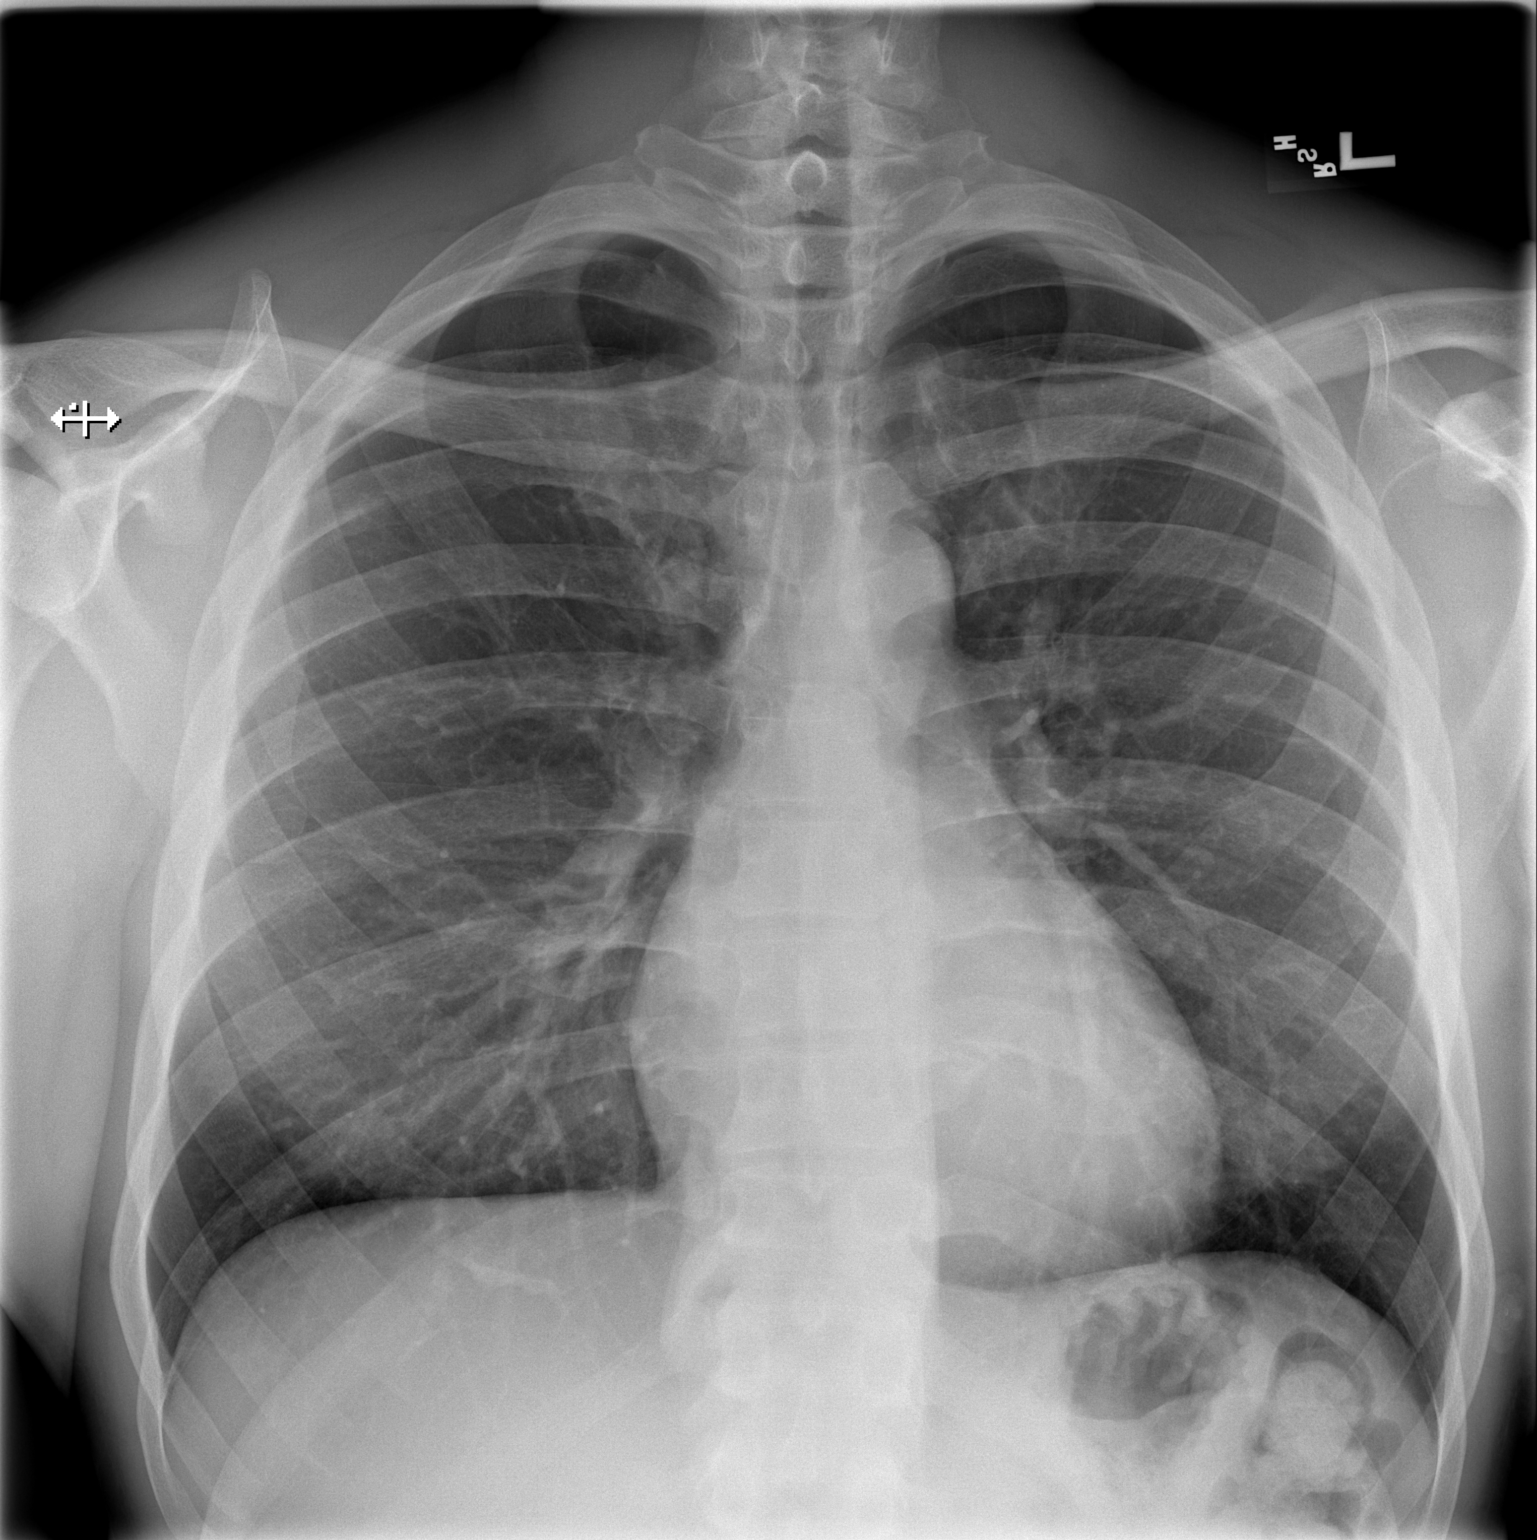

[w chest lat]
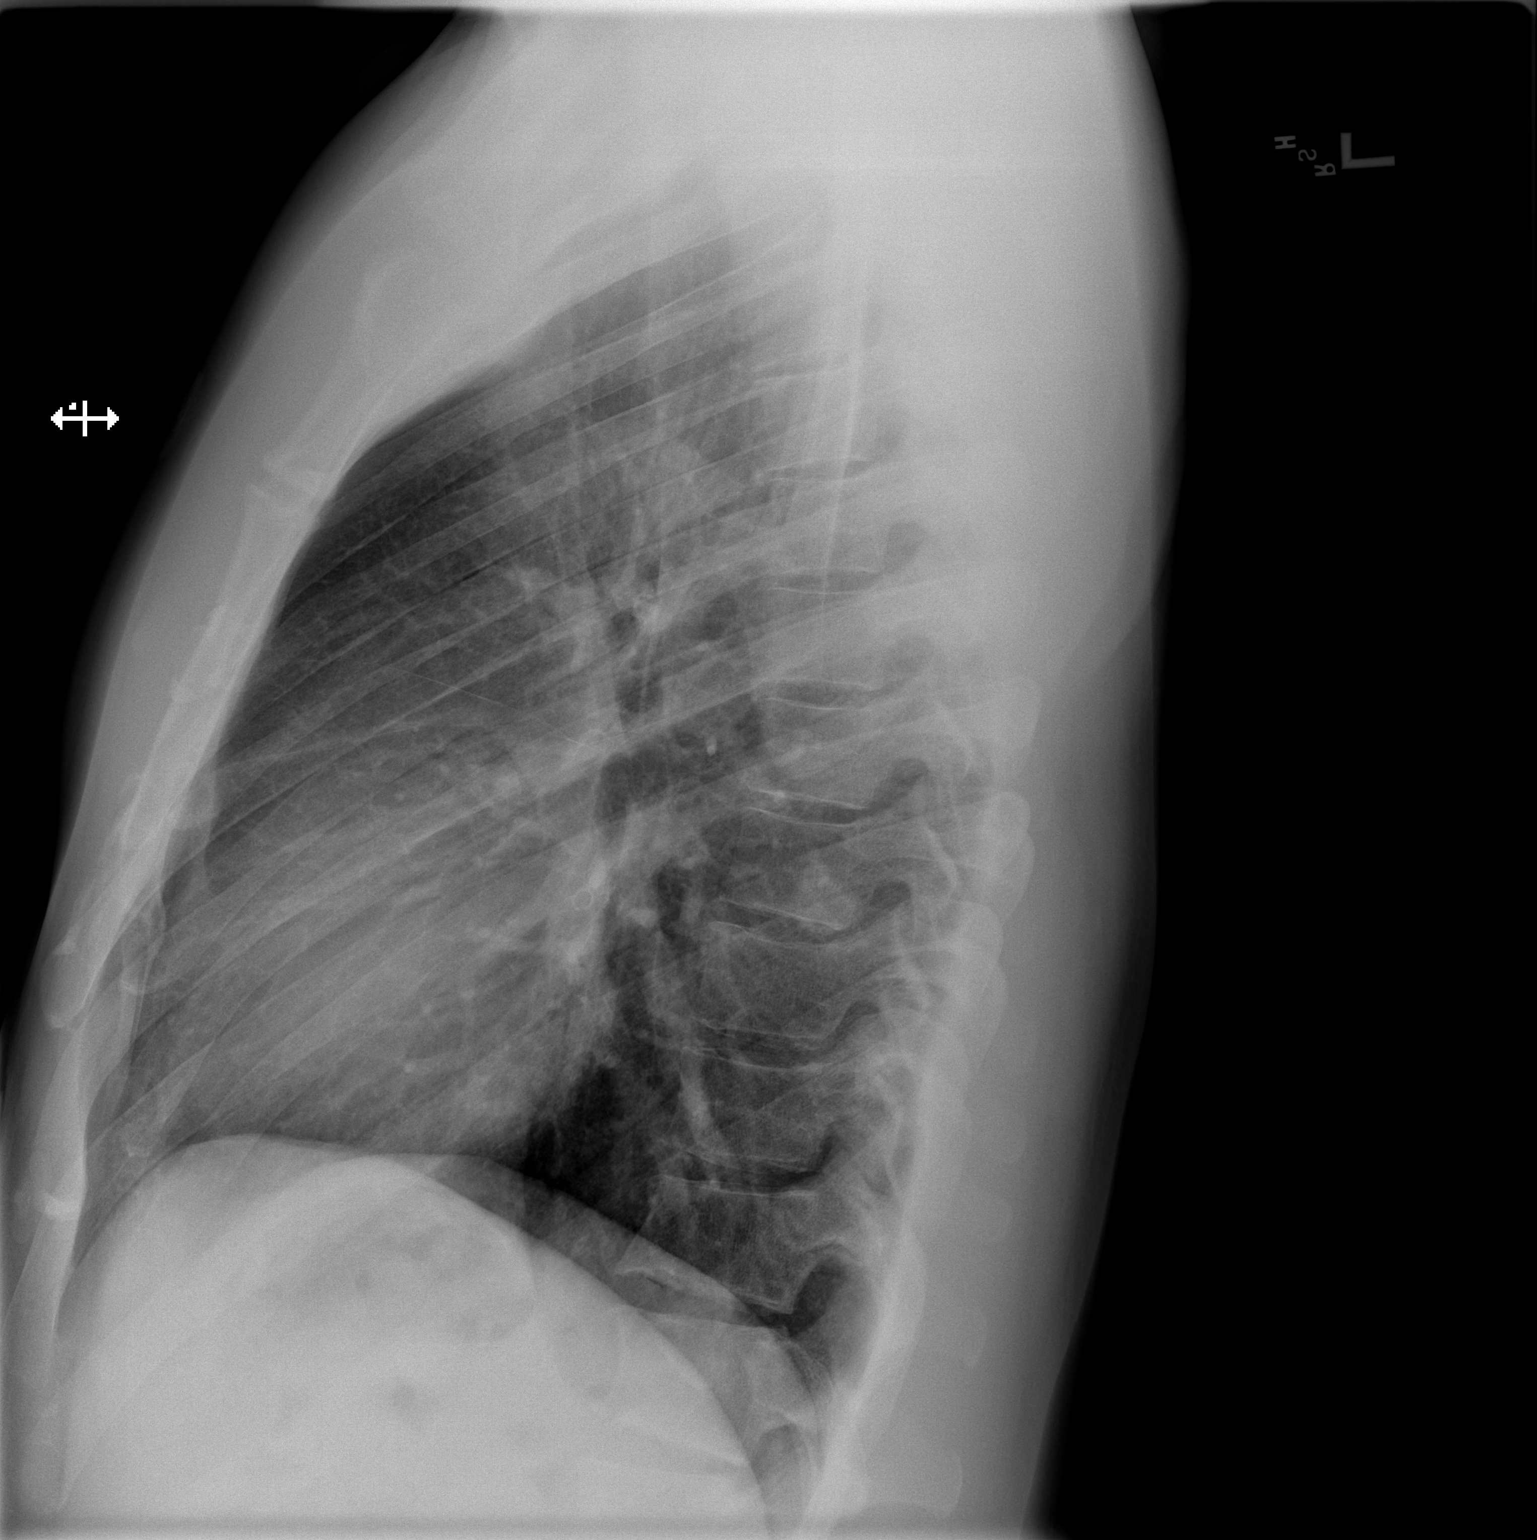

[2 of 2 positions shown; findings below may reference images not displayed]

FINDINGS: Cardiomediastinal silhouette appears normal.  No acute
pulmonary disease is noted.  Bony thorax is intact.
IMPRESSION: No acute cardiopulmonary abnormality seen.

## 2015-10-03 ENCOUNTER — Inpatient Hospital Stay (HOSPITAL_COMMUNITY)
Admission: AD | Admit: 2015-10-03 | Discharge: 2015-10-07 | DRG: 897 | Disposition: A | Discharge status: Home / Self Care | Payer: Federal, State, Local not specified - Other | Source: Intra-hospital | Attending: Psychiatry | Admitting: Psychiatry

## 2015-10-03 ENCOUNTER — Encounter (HOSPITAL_COMMUNITY): Payer: Self-pay | Admitting: Emergency Medicine

## 2015-10-03 ENCOUNTER — Encounter (HOSPITAL_COMMUNITY): Payer: Self-pay

## 2015-10-03 ENCOUNTER — Emergency Department (HOSPITAL_COMMUNITY)
Admission: EM | Admit: 2015-10-03 | Discharge: 2015-10-03 | Disposition: A | Discharge status: Other Acute Inpatient Hospital | Payer: Self-pay | Attending: Emergency Medicine | Admitting: Emergency Medicine

## 2015-10-03 DIAGNOSIS — F17210 Nicotine dependence, cigarettes, uncomplicated: Secondary | ICD-10-CM | POA: Insufficient documentation

## 2015-10-03 DIAGNOSIS — E039 Hypothyroidism, unspecified: Secondary | ICD-10-CM | POA: Diagnosis present

## 2015-10-03 DIAGNOSIS — F919 Conduct disorder, unspecified: Secondary | ICD-10-CM | POA: Insufficient documentation

## 2015-10-03 DIAGNOSIS — Z79899 Other long term (current) drug therapy: Secondary | ICD-10-CM | POA: Insufficient documentation

## 2015-10-03 DIAGNOSIS — F1994 Other psychoactive substance use, unspecified with psychoactive substance-induced mood disorder: Secondary | ICD-10-CM | POA: Diagnosis present

## 2015-10-03 DIAGNOSIS — F1424 Cocaine dependence with cocaine-induced mood disorder: Principal | ICD-10-CM | POA: Diagnosis present

## 2015-10-03 DIAGNOSIS — I10 Essential (primary) hypertension: Secondary | ICD-10-CM | POA: Insufficient documentation

## 2015-10-03 DIAGNOSIS — F1020 Alcohol dependence, uncomplicated: Secondary | ICD-10-CM | POA: Diagnosis present

## 2015-10-03 DIAGNOSIS — E02 Subclinical iodine-deficiency hypothyroidism: Secondary | ICD-10-CM

## 2015-10-03 DIAGNOSIS — F10230 Alcohol dependence with withdrawal, uncomplicated: Secondary | ICD-10-CM | POA: Insufficient documentation

## 2015-10-03 DIAGNOSIS — F339 Major depressive disorder, recurrent, unspecified: Secondary | ICD-10-CM | POA: Insufficient documentation

## 2015-10-03 DIAGNOSIS — R45851 Suicidal ideations: Secondary | ICD-10-CM | POA: Diagnosis present

## 2015-10-03 DIAGNOSIS — R451 Restlessness and agitation: Secondary | ICD-10-CM | POA: Insufficient documentation

## 2015-10-03 DIAGNOSIS — F1410 Cocaine abuse, uncomplicated: Secondary | ICD-10-CM | POA: Insufficient documentation

## 2015-10-03 DIAGNOSIS — F1210 Cannabis abuse, uncomplicated: Secondary | ICD-10-CM | POA: Insufficient documentation

## 2015-10-03 DIAGNOSIS — E079 Disorder of thyroid, unspecified: Secondary | ICD-10-CM | POA: Insufficient documentation

## 2015-10-03 DIAGNOSIS — G4700 Insomnia, unspecified: Secondary | ICD-10-CM | POA: Diagnosis present

## 2015-10-03 DIAGNOSIS — F4310 Post-traumatic stress disorder, unspecified: Secondary | ICD-10-CM | POA: Insufficient documentation

## 2015-10-03 LAB — CBC
HCT: 41.6 % (ref 39.0–52.0)
HEMOGLOBIN: 14.1 g/dL (ref 13.0–17.0)
MCH: 34.8 pg — AB (ref 26.0–34.0)
MCHC: 33.9 g/dL (ref 30.0–36.0)
MCV: 102.7 fL — ABNORMAL HIGH (ref 78.0–100.0)
PLATELETS: 260 10*3/uL (ref 150–400)
RBC: 4.05 MIL/uL — ABNORMAL LOW (ref 4.22–5.81)
RDW: 12.3 % (ref 11.5–15.5)
WBC: 9.1 10*3/uL (ref 4.0–10.5)

## 2015-10-03 LAB — RAPID URINE DRUG SCREEN, HOSP PERFORMED
Amphetamines: NOT DETECTED
Barbiturates: NOT DETECTED
Benzodiazepines: NOT DETECTED
COCAINE: POSITIVE — AB
OPIATES: NOT DETECTED
Tetrahydrocannabinol: POSITIVE — AB

## 2015-10-03 LAB — ACETAMINOPHEN LEVEL

## 2015-10-03 LAB — COMPREHENSIVE METABOLIC PANEL
ALK PHOS: 60 U/L (ref 38–126)
ALT: 35 U/L (ref 17–63)
ANION GAP: 10 (ref 5–15)
AST: 59 U/L — ABNORMAL HIGH (ref 15–41)
Albumin: 4.1 g/dL (ref 3.5–5.0)
BUN: 27 mg/dL — ABNORMAL HIGH (ref 6–20)
CALCIUM: 9.6 mg/dL (ref 8.9–10.3)
CO2: 25 mmol/L (ref 22–32)
CREATININE: 1.86 mg/dL — AB (ref 0.61–1.24)
Chloride: 104 mmol/L (ref 101–111)
GFR, EST AFRICAN AMERICAN: 49 mL/min — AB (ref >60)
GFR, EST NON AFRICAN AMERICAN: 42 mL/min — AB (ref >60)
Glucose, Bld: 113 mg/dL — ABNORMAL HIGH (ref 65–99)
Potassium: 3.8 mmol/L (ref 3.5–5.1)
SODIUM: 139 mmol/L (ref 135–145)
Total Bilirubin: 0.9 mg/dL (ref 0.3–1.2)
Total Protein: 6.6 g/dL (ref 6.5–8.1)

## 2015-10-03 LAB — ETHANOL

## 2015-10-03 LAB — SALICYLATE LEVEL

## 2015-10-03 LAB — TSH: TSH: 32.42 u[IU]/mL — AB (ref 0.350–4.500)

## 2015-10-03 LAB — T4, FREE: Free T4: <0.25 ng/dL — ABNORMAL LOW (ref 0.61–1.12)

## 2015-10-03 MED ORDER — LOPERAMIDE HCL 2 MG PO CAPS: 2.0000 mg | ORAL_CAPSULE | ORAL | Status: AC | PRN

## 2015-10-03 MED ORDER — LEVOTHYROXINE SODIUM 200 MCG PO TABS
200.0000 ug | ORAL_TABLET | Freq: Every day | ORAL | Status: DC
  Administered 2015-10-04 – 2015-10-07 (×4): 200 ug via ORAL
  Filled 2015-10-03 (×4): qty 1
  Filled 2015-10-03: qty 14
  Filled 2015-10-03: qty 1
  Filled 2015-10-03: qty 14
  Filled 2015-10-03: qty 2

## 2015-10-03 MED ORDER — LORAZEPAM 1 MG PO TABS
1.0000 mg | ORAL_TABLET | Freq: Three times a day (TID) | ORAL | Status: AC
  Administered 2015-10-05 (×3): 1 mg via ORAL
  Filled 2015-10-03 (×3): qty 1

## 2015-10-03 MED ORDER — TRAZODONE HCL 50 MG PO TABS
50.0000 mg | ORAL_TABLET | Freq: Every evening | ORAL | Status: DC | PRN
  Administered 2015-10-03 – 2015-10-06 (×3): 50 mg via ORAL
  Filled 2015-10-03 (×2): qty 1
  Filled 2015-10-03: qty 14
  Filled 2015-10-03: qty 1

## 2015-10-03 MED ORDER — HYDROXYZINE HCL 25 MG PO TABS: 25.0000 mg | ORAL_TABLET | Freq: Four times a day (QID) | ORAL | Status: DC | PRN

## 2015-10-03 MED ORDER — LORAZEPAM 1 MG PO TABS
1.0000 mg | ORAL_TABLET | Freq: Four times a day (QID) | ORAL | Status: AC | PRN
  Filled 2015-10-03: qty 1

## 2015-10-03 MED ORDER — LEVOTHYROXINE SODIUM 200 MCG PO TABS
200.0000 ug | ORAL_TABLET | Freq: Every day | ORAL | Status: DC
  Administered 2015-10-03: 200 ug via ORAL
  Filled 2015-10-03 (×2): qty 1

## 2015-10-03 MED ORDER — ONDANSETRON 4 MG PO TBDP: 4.0000 mg | ORAL_TABLET | Freq: Four times a day (QID) | ORAL | Status: AC | PRN

## 2015-10-03 MED ORDER — ONDANSETRON HCL 4 MG PO TABS: 4.0000 mg | ORAL_TABLET | Freq: Three times a day (TID) | ORAL | Status: DC | PRN

## 2015-10-03 MED ORDER — NICOTINE 21 MG/24HR TD PT24: 21.0000 mg | MEDICATED_PATCH | Freq: Every day | TRANSDERMAL | Status: DC

## 2015-10-03 MED ORDER — MAGNESIUM HYDROXIDE 400 MG/5ML PO SUSP: 30.0000 mL | Freq: Every day | ORAL | Status: DC | PRN

## 2015-10-03 MED ORDER — SODIUM CHLORIDE 0.9 % IV BOLUS (SEPSIS): 1000.0000 mL | Freq: Once | INTRAVENOUS | Status: DC

## 2015-10-03 MED ORDER — LORAZEPAM 1 MG PO TABS
1.0000 mg | ORAL_TABLET | Freq: Two times a day (BID) | ORAL | Status: AC
  Administered 2015-10-06 (×2): 1 mg via ORAL
  Filled 2015-10-03 (×2): qty 1

## 2015-10-03 MED ORDER — LORAZEPAM 1 MG PO TABS
1.0000 mg | ORAL_TABLET | Freq: Four times a day (QID) | ORAL | Status: AC
  Administered 2015-10-03 – 2015-10-04 (×6): 1 mg via ORAL
  Filled 2015-10-03 (×5): qty 1

## 2015-10-03 MED ORDER — HYDROXYZINE HCL 25 MG PO TABS
25.0000 mg | ORAL_TABLET | Freq: Four times a day (QID) | ORAL | Status: DC | PRN
  Administered 2015-10-03 – 2015-10-07 (×2): 25 mg via ORAL
  Filled 2015-10-03 (×2): qty 1
  Filled 2015-10-03: qty 10

## 2015-10-03 MED ORDER — LORAZEPAM 1 MG PO TABS
1.0000 mg | ORAL_TABLET | Freq: Every day | ORAL | Status: AC
  Administered 2015-10-07: 1 mg via ORAL
  Filled 2015-10-03: qty 1

## 2015-10-03 MED ORDER — ACETAMINOPHEN 325 MG PO TABS
650.0000 mg | ORAL_TABLET | Freq: Four times a day (QID) | ORAL | Status: DC | PRN
Start: 2015-10-03 — End: 2015-10-07
  Administered 2015-10-04 – 2015-10-07 (×3): 650 mg via ORAL
  Filled 2015-10-03 (×3): qty 2

## 2015-10-03 MED ORDER — ALUM & MAG HYDROXIDE-SIMETH 200-200-20 MG/5ML PO SUSP: 30.0000 mL | ORAL | Status: DC | PRN

## 2015-10-03 MED ORDER — ACETAMINOPHEN 325 MG PO TABS: 650.0000 mg | ORAL_TABLET | ORAL | Status: DC | PRN

## 2015-10-03 NOTE — ED Notes (Signed)
Bed: [facility] Expected date:  Expected time:  Means of arrival:  Comments: Triage 6
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
## 2015-10-03 NOTE — ED Notes (Signed)
Lab has SST tubes for running TSH/T4. Requested lab labels be sent to lab for processing.

## 2015-10-03 NOTE — ED Notes (Addendum)
Patient state he is here for bad thought. Thoughts of suicide without a plan and homicide against "someone close to me (him)". Patient states he has had these thoughts for years. Patient states he is out of his thyroid medication, states he thinks he had these thoughts when he runs out of this medication. Patient take levothyroxine and synthroid for thyroid, has been off these for about 1 month. Patient does not have PCP.

## 2015-10-03 NOTE — Progress Notes (Signed)
This is a 44 yrs old male who came to [facility] from [facility]. Pt was cooperative and compliant during admission. Pt stated that he came here because he wants to ' get may morals and values back and feel like a human being a gain.' Pt denied pain, but alert  And oriented x4. Pt was introduced to the unit and shown his room. Pt has been calm, went to lunch, no complains observed or reported. Pt's safety ensured with 15 minute and environmental checks. Pt currently denies SI/HI and A/V hallucinations. Pt verbally agrees to seek staff if SI/HI or A/VH occurs and to consult with staff before acting on these thoughts.
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
## 2015-10-03 NOTE — Progress Notes (Signed)
D.  Pt pleasant on approach, in bed.  Pt remained in bed for shift, had not been sleeping and was hopeful to get some tonight.  Pt denies SI/HI/hallucinations at this time.  Minimal interaction.  A.  Support and encouragement offered  R.  Pt remains safe on the unit, will continue to monitor.

## 2015-10-03 NOTE — ED Notes (Signed)
Pt. To [facility] from ED ambulatory without difficulty, to room 38 . Report from [facility]. Pt. Is alert and oriented, warm and dry in no distress. Pt. Denies SI, HI, and AVH. Pt. Calm and cooperative. Pt. Made aware of security cameras and Q15 minute rounds. Pt. Encouraged to let Nursing staff know of any concerns or needs.

## 2015-10-03 NOTE — ED Notes (Signed)
Pt given water, tolerated well

## 2015-10-03 NOTE — ED Notes (Signed)
TTS at bedside. 

## 2015-10-03 NOTE — Progress Notes (Signed)
Pt did not attend AA group.  Pt was informed when group was starting but stayed in bed instead.

## 2015-10-03 NOTE — ED Notes (Signed)
Pt given food try for breakfast.

## 2015-10-03 NOTE — BHH Suicide Risk Assessment (Signed)
[facility] Admission Suicide Risk Assessment   Nursing information obtained from:    Demographic factors:    Current Mental Status:    Loss Factors:    Historical Factors:    Risk Reduction Factors:    Total Time spent with patient: 45 minutes Principal Problem: Substance induced mood disorder ([place]) Diagnosis:   Patient Active Problem List   Diagnosis Date Noted  . Substance induced mood disorder ([place]) [F19.94] 10/03/2015  . Cocaine dependence with cocaine-induced mood disorder ([place]) [F14.24] 10/03/2015  . Alcohol dependence ([place]) [F10.20] 10/03/2015     Continued Clinical Symptoms:  Alcohol Use Disorder Identification Test Final Score (AUDIT): 8 The "Alcohol Use Disorders Identification Test", Guidelines for Use in Primary Care, Second Edition.  World Pharmacologist [facility]). Score between 0-7:  no or low risk or alcohol related problems. Score between 8-15:  moderate risk of alcohol related problems. Score between 16-19:  high risk of alcohol related problems. Score 20 or above:  warrants further diagnostic evaluation for alcohol dependence and treatment.   CLINICAL FACTORS:   Depression:   Comorbid alcohol abuse/dependence Alcohol/Substance Abuse/Dependencies  Psychiatric Specialty Exam: Physical Exam  ROS  Blood pressure 105/65, pulse 77, temperature 98.2 F (36.8 C), temperature source Oral, resp. rate 16, height 6\' 5"  (1.956 m), weight 97.07 kg (214 lb), SpO2 100 %.Body mass index is 25.37 kg/(m^2).   COGNITIVE FEATURES THAT CONTRIBUTE TO RISK:  Closed-mindedness, Polarized thinking and Thought constriction (tunnel vision)    SUICIDE RISK:   Moderate:  Frequent suicidal ideation with limited intensity, and duration, some specificity in terms of plans, no associated intent, good self-control, limited dysphoria/symptomatology, some risk factors present, and identifiable protective factors, including available and accessible social support.  PLAN OF CARE: See Admission H  and P  Medical Decision Making:  Review of Psycho-Social Stressors (1), Review or order clinical lab tests (1), Review of Medication Regimen & Side Effects (2) and Review of New Medication or Change in Dosage (2)  I certify that inpatient services furnished can reasonably be expected to improve the patient's condition.   Ellouise Mcwhirter A 10/03/2015, 6:52 PM

## 2015-10-03 NOTE — Tx Team (Signed)
Initial Interdisciplinary Treatment Plan   PATIENT STRESSORS: Financial difficulties Marital or family conflict Occupational concerns Substance abuse   PATIENT STRENGTHS: Ability for insight Armed forces logistics/support/administrative officer Motivation for treatment/growth   PROBLEM LIST: Problem List/Patient Goals Date to be addressed Date deferred Reason deferred Estimated date of resolution  "Get my morals and values back in order." 10/03/15     "Get my mind off the drugs." 10/03/15     Substance abuse 10/03/15     Anxiety 10/03/15                                    DISCHARGE CRITERIA:  Ability to meet basic life and health needs Adequate post-discharge living arrangements Improved stabilization in mood, thinking, and/or behavior  PRELIMINARY DISCHARGE PLAN: Attend aftercare/continuing care group Attend PHP/IOP Attend 12-step recovery group  PATIENT/FAMIILY INVOLVEMENT: This treatment plan has been presented to and reviewed with the patient, Osie Abonce, and/or family member.  The patient and family have been given the opportunity to ask questions and make suggestions.  Wolfgang Phoenix 10/03/2015, 12:57 PM

## 2015-10-03 NOTE — ED Notes (Signed)
Bed: WA08 Expected date:  Expected time:  Means of arrival:  Comments: 

## 2015-10-03 NOTE — ED Notes (Signed)
PA at bedside.

## 2015-10-03 NOTE — ED Provider Notes (Signed)
CSN: [id number]     Arrival date & time 10/03/15  0133 History   First MD Initiated Contact with Patient 10/03/15 0155     Chief Complaint  Patient presents with  . Suicidal    "no plan"  . Homicidal    "someone close to me"     (Consider location/radiation/quality/duration/timing/severity/associated sxs/prior Treatment) HPI Comments: 44 year old male with a history of thyroid disease, back pain, depression, and hypertension presents to the emergency department for further psychiatric evaluation. Patient states that he feels as though he is being provoked. He reports becoming easily curable and agitated. He states that this is causing him to think that thoughts. He has also had thoughts of suicide recently. He denies any suicidal ideations currently. Patient has been off his psychiatric medications for the past 2 months following a syncopal event. He reports that he believes his syncope was due to his medications. Patient has also been having difficulty getting his thyroid medication over the past month. He reports increased work and life stressors. He has been using alcohol and cocaine regularly, most recently yesterday. Patient also endorses marijuana use.  The history is provided by the patient. No language interpreter was used.    Past Medical History  Diagnosis Date  . Thyroid disease   . Back pain   . Depression   . Hypertension    History reviewed. No pertinent past surgical history. History reviewed. No pertinent family history. Social History  Substance Use Topics  . Smoking status: Current Every Day Smoker -- 0.50 packs/day    Types: Cigarettes  . Smokeless tobacco: None  . Alcohol Use: No     Comment: occ    Review of Systems  Psychiatric/Behavioral: Positive for suicidal ideas, behavioral problems and agitation.  All other systems reviewed and are negative.   Allergies  Review of patient's allergies indicates no known allergies.  Home Medications   Prior to  Admission medications   Medication Sig Start Date End Date Taking? Authorizing Provider  levothyroxine (SYNTHROID, LEVOTHROID) 200 MCG tablet Take 1 tablet (200 mcg total) by mouth daily before breakfast. 02/20/14  Yes Ryan M Dunn, PA-C   BP 117/74 mmHg  Pulse 86  Temp(Src) 98.6 F (37 C) (Oral)  Resp 18  Ht 6\' 5"  (1.956 m)  Wt 97.07 kg  BMI 25.37 kg/m2  SpO2 97%   Physical Exam  Constitutional: He is oriented to person, place, and time. He appears well-developed and well-nourished. No distress.  Nontoxic/nonseptic appearing. Pleasant.  HENT:  Head: Normocephalic and atraumatic.  Eyes: Conjunctivae and EOM are normal. No scleral icterus.  Neck: Normal range of motion.  Pulmonary/Chest: Effort normal. No respiratory distress.  Musculoskeletal: Normal range of motion.  Neurological: He is alert and oriented to person, place, and time. He exhibits normal muscle tone. Coordination normal.  Skin: Skin is warm and dry. No rash noted. He is not diaphoretic. No erythema. No pallor.  Psychiatric: He has a normal mood and affect. His behavior is normal. He expresses no homicidal and no suicidal ideation.  Nursing note and vitals reviewed.   ED Course  Procedures (including critical care time) Labs Review Labs Reviewed  COMPREHENSIVE METABOLIC PANEL - Abnormal; Notable for the following:    Glucose, Bld 113 (*)    BUN 27 (*)    Creatinine, Ser 1.86 (*)    AST 59 (*)    GFR calc non Af Amer 42 (*)    GFR calc Af Amer 49 (*)    All  other components within normal limits  ACETAMINOPHEN LEVEL - Abnormal; Notable for the following:    Acetaminophen (Tylenol), Serum <10 (*)    All other components within normal limits  CBC - Abnormal; Notable for the following:    RBC 4.05 (*)    MCV 102.7 (*)    MCH 34.8 (*)    All other components within normal limits  URINE RAPID DRUG SCREEN, HOSP PERFORMED - Abnormal; Notable for the following:    Cocaine POSITIVE (*)    Tetrahydrocannabinol  POSITIVE (*)    All other components within normal limits  TSH - Abnormal; Notable for the following:    TSH 32.420 (*)    All other components within normal limits  ETHANOL  SALICYLATE LEVEL  T4, FREE    Imaging Review No results found.   I have personally reviewed and evaluated these images and lab results as part of my medical decision-making.   EKG Interpretation None      MDM   Final diagnoses:  Recurrent major depressive disorder, remission status unspecified (HCC)  PTSD (post-traumatic stress disorder)    44 year old male presents to the emergency department for psychiatric evaluation. He is pending psychiatry evaluation in the morning. Labs reviewed which show findings consistent with hypothyroidism. Patient has been off of his thyroid medication for 1 month. Will restart this in the emergency department. He also has an elevated kidney function. This was seen, also, 4 years ago and resolved. Suspect that this is due to his substance use. Patient will require recheck of his kidney function by a primary care provider on an outpatient basis. Will hydrate with 1L IVF. Patient medically cleared.   Filed Vitals:   10/03/15 0140  BP: 117/74  Pulse: 86  Temp: 98.6 F (37 C)  TempSrc: Oral  Resp: 18  Height: 6\' 5"  (1.956 m)  Weight: 97.07 kg  SpO2: 97%     Antonietta Breach, PA-C 10/03/15 [id number]   [phone] - Patient accepted at [facility]. Accepting MD, Dr. Sabra Heck. EMTALA completed for transfer.  Antonietta Breach, PA-C 10/03/15 [place], MD 10/04/15 [phone]

## 2015-10-04 NOTE — Progress Notes (Signed)
D.  Pt pleasant on approach, no complaints voiced.  No s/s of withdrawal noted or observed.  Pt has remained in room in bed for duration of shift.  Denies SI/HI/hallucinations at this time.  Minimal interaction.  A.  Support and encouragement offered  R.  Pt remains safe on unit, will continue to monitor.

## 2015-10-04 NOTE — BHH Suicide Risk Assessment (Signed)
[facility] Adult Inpatient Family/Significant Other Suicide Prevention Education  Suicide Prevention Education:   Patient Refusal for Family/Significant Other Suicide Prevention Education: The patient has refused to provide written consent for family/significant other to be provided Family/Significant Other Suicide Prevention Education during admission and/or prior to discharge.  Physician notified.  CSW provided suicide prevention information with patient.    The suicide prevention education provided includes the following:  Suicide risk factors  Suicide prevention and interventions  National Suicide Hotline telephone number  [facility] assessment telephone number  [facility] Emergency Assistance [place] and/or Residential Mobile Crisis Unit telephone number   Regan Lemming, [place] 10/04/2015 2:17 PM
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
## 2015-10-04 NOTE — Progress Notes (Signed)
Patient did not attend the evening speaker AA meeting. Pt was notified that group was beginning but remained in bed.   

## 2015-10-04 NOTE — Progress Notes (Signed)
[facility] MD Progress Note  10/04/2015 10:55 AM Johnathan Baker  MRN:  [id number]   Subjective:  Johnathan Baker is awake, alert and oriented X4 , found resting in bedroom group.  Denies suicidal or homicidal ideation. Denies auditory or visual hallucination and does not appear to be responding to internal stimuli. Patient reports interacting well with staff and others. Patient reports he is medication compliant without mediation side effects. Report learning new coping skills. States he has started reading his bible and working on group packets for stress management/ interacting with others effectively.  States his depression 8/10. Patient states "I wasn't feeling  Myself on yesterday and l better today". Reports good appetite other wise and resting well. Support, encouragement and reassurance was provided.   Principal Problem: Substance induced mood disorder ([place]) Diagnosis:   Patient Active Problem List   Diagnosis Date Noted  . Substance induced mood disorder ([place]) [F19.94] 10/03/2015  . Cocaine dependence with cocaine-induced mood disorder ([place]) [F14.24] 10/03/2015  . Alcohol dependence ([place]) [F10.20] 10/03/2015  . Hypothyroidism [E03.9] 10/03/2015   Total Time spent with patient: 45 minutes  Past Psychiatric History: PTSD, Polysubstance abuse  Past Medical History:  Past Medical History  Diagnosis Date  . Thyroid disease   . Back pain   . Depression   . Hypertension    History reviewed. No pertinent past surgical history. Family History: History reviewed. No pertinent family history. Family Psychiatric  History: Unknown Social History:  History  Alcohol Use No    Comment: occ     History  Drug Use  . 0.50 per week  . Special: Cocaine    Social History   Social History  . Marital Status: Single    Spouse Name: N/A  . Number of Children: N/A  . Years of Education: N/A   Social History Main Topics  . Smoking status: Current Every Day Smoker -- 0.50 packs/day    Types:  Cigarettes  . Smokeless tobacco: None  . Alcohol Use: No     Comment: occ  . Drug Use: 0.50 per week    Special: Cocaine  . Sexual Activity: Not Asked   Other Topics Concern  . None   Social History Narrative   Additional Social History:    Pain Medications: See PTA medication list Prescriptions: Gabapenten was one of the meds.  Unsure of what one of them. Over the Counter: None History of alcohol / drug use?: Yes Longest period of sobriety (when/how long): unsure Negative Consequences of Use: Personal relationships Withdrawal Symptoms: Irritability Name of Substance 1: Crack 1 - Age of First Use: 44 years of age 107 - Amount (size/oz): Varies 1 - Frequency: 2-3 times in a month 1 - Duration: On-going 1 - Last Use / Amount: 11/24                  Sleep: Good  Appetite:  Fair  Current Medications: Current Facility-Administered Medications  Medication Dose Route Frequency Provider Last Rate Last Dose  . acetaminophen (TYLENOL) tablet 650 mg  650 mg Oral Q6H PRN Niel Hummer, NP      . alum & mag hydroxide-simeth (MAALOX/MYLANTA) 200-200-20 MG/5ML suspension 30 mL  30 mL Oral Q4H PRN Niel Hummer, NP      . hydrOXYzine (ATARAX/VISTARIL) tablet 25 mg  25 mg Oral Q6H PRN Niel Hummer, NP   25 mg at 10/03/15 2122  . levothyroxine (SYNTHROID, LEVOTHROID) tablet 200 mcg  200 mcg Oral QAC breakfast Nicholaus Bloom,  MD   200 mcg at 10/04/15 0612  . loperamide (IMODIUM) capsule 2-4 mg  2-4 mg Oral PRN Nicholaus Bloom, MD      . LORazepam (ATIVAN) tablet 1 mg  1 mg Oral Q6H PRN Nicholaus Bloom, MD      . LORazepam (ATIVAN) tablet 1 mg  1 mg Oral QID Nicholaus Bloom, MD   1 mg at 10/04/15 4540   Followed by  . [START ON 10/05/2015] LORazepam (ATIVAN) tablet 1 mg  1 mg Oral TID Nicholaus Bloom, MD       Followed by  . [START ON 10/06/2015] LORazepam (ATIVAN) tablet 1 mg  1 mg Oral BID Nicholaus Bloom, MD       Followed by  . [START ON 10/07/2015] LORazepam (ATIVAN) tablet 1 mg  1 mg  Oral Daily Nicholaus Bloom, MD      . magnesium hydroxide (MILK OF MAGNESIA) suspension 30 mL  30 mL Oral Daily PRN Niel Hummer, NP      . ondansetron (ZOFRAN-ODT) disintegrating tablet 4 mg  4 mg Oral Q6H PRN Nicholaus Bloom, MD      . traZODone (DESYREL) tablet 50 mg  50 mg Oral QHS PRN Niel Hummer, NP   50 mg at 10/03/15 2122    Lab Results:  Results for orders placed or performed during the hospital encounter of 10/03/15 (from the past 48 hour(s))  Comprehensive metabolic panel     Status: Abnormal   Collection Time: 10/03/15  2:18 AM  Result Value Ref Range   Sodium 139 135 - 145 mmol/L   Potassium 3.8 3.5 - 5.1 mmol/L   Chloride 104 101 - 111 mmol/L   CO2 25 22 - 32 mmol/L   Glucose, Bld 113 (H) 65 - 99 mg/dL   BUN 27 (H) 6 - 20 mg/dL   Creatinine, Ser 1.86 (H) 0.61 - 1.24 mg/dL   Calcium 9.6 8.9 - 10.3 mg/dL   Total Protein 6.6 6.5 - 8.1 g/dL   Albumin 4.1 3.5 - 5.0 g/dL   AST 59 (H) 15 - 41 U/L   ALT 35 17 - 63 U/L   Alkaline Phosphatase 60 38 - 126 U/L   Total Bilirubin 0.9 0.3 - 1.2 mg/dL   GFR calc non Af Amer 42 (L) >60 mL/min   GFR calc Af Amer 49 (L) >60 mL/min    Comment: (NOTE) The eGFR has been calculated using the CKD EPI equation. This calculation has not been validated in all clinical situations. eGFR's persistently <60 mL/min signify possible Chronic Kidney Disease.    Anion gap 10 5 - 15  Ethanol (ETOH)     Status: None   Collection Time: 10/03/15  2:18 AM  Result Value Ref Range   Alcohol, Ethyl (B) <5 <5 mg/dL    Comment:        LOWEST DETECTABLE LIMIT FOR SERUM ALCOHOL IS 5 mg/dL FOR MEDICAL PURPOSES ONLY   Salicylate level     Status: None   Collection Time: 10/03/15  2:18 AM  Result Value Ref Range   Salicylate Lvl <9.8 2.8 - 30.0 mg/dL  Acetaminophen level     Status: Abnormal   Collection Time: 10/03/15  2:18 AM  Result Value Ref Range   Acetaminophen (Tylenol), Serum <10 (L) 10 - 30 ug/mL    Comment:        THERAPEUTIC CONCENTRATIONS  VARY SIGNIFICANTLY. A RANGE OF 10-30 ug/mL MAY BE AN EFFECTIVE CONCENTRATION  FOR MANY PATIENTS. HOWEVER, SOME ARE BEST TREATED AT CONCENTRATIONS OUTSIDE THIS RANGE. ACETAMINOPHEN CONCENTRATIONS >150 ug/mL AT 4 HOURS AFTER INGESTION AND >50 ug/mL AT 12 HOURS AFTER INGESTION ARE OFTEN ASSOCIATED WITH TOXIC REACTIONS.   CBC     Status: Abnormal   Collection Time: 10/03/15  2:18 AM  Result Value Ref Range   WBC 9.1 4.0 - 10.5 K/uL   RBC 4.05 (L) 4.22 - 5.81 MIL/uL   Hemoglobin 14.1 13.0 - 17.0 g/dL   HCT 41.6 39.0 - 52.0 %   MCV 102.7 (H) 78.0 - 100.0 fL   MCH 34.8 (H) 26.0 - 34.0 pg   MCHC 33.9 30.0 - 36.0 g/dL   RDW 12.3 11.5 - 15.5 %   Platelets 260 150 - 400 K/uL  TSH     Status: Abnormal   Collection Time: 10/03/15  2:18 AM  Result Value Ref Range   TSH 32.420 (H) 0.350 - 4.500 uIU/mL  T4, free     Status: Abnormal   Collection Time: 10/03/15  2:18 AM  Result Value Ref Range   Free T4 <0.25 (L) 0.61 - 1.12 ng/dL    Comment: Performed at [facility]  Urine rapid drug screen (hosp performed) (Not at [facility])     Status: Abnormal   Collection Time: 10/03/15  3:20 AM  Result Value Ref Range   Opiates NONE DETECTED NONE DETECTED   Cocaine POSITIVE (A) NONE DETECTED   Benzodiazepines NONE DETECTED NONE DETECTED   Amphetamines NONE DETECTED NONE DETECTED   Tetrahydrocannabinol POSITIVE (A) NONE DETECTED   Barbiturates NONE DETECTED NONE DETECTED    Comment:        DRUG SCREEN FOR MEDICAL PURPOSES ONLY.  IF CONFIRMATION IS NEEDED FOR ANY PURPOSE, NOTIFY LAB WITHIN 5 DAYS.        LOWEST DETECTABLE LIMITS FOR URINE DRUG SCREEN Drug Class       Cutoff (ng/mL) Amphetamine      1000 Barbiturate      200 Benzodiazepine   161 Tricyclics       096 Opiates          300 Cocaine          300 THC              50     Physical Findings: AIMS: Facial and Oral Movements Muscles of Facial Expression: None, normal Lips and Perioral Area: None, normal Jaw: None,  normal Tongue: None, normal,Extremity Movements Upper (arms, wrists, hands, fingers): None, normal Lower (legs, knees, ankles, toes): None, normal, Trunk Movements Neck, shoulders, hips: None, normal, Overall Severity Severity of abnormal movements (highest score from questions above): None, normal Incapacitation due to abnormal movements: None, normal Patient's awareness of abnormal movements (rate only patient's report): No Awareness, Dental Status Current problems with teeth and/or dentures?: Yes Does patient usually wear dentures?: No  CIWA:  CIWA-Ar Total: 0 COWS:  COWS Total Score: 0  Musculoskeletal: Strength & Muscle Tone: within normal limits Gait & Station: normal Patient leans: N/A  Psychiatric Specialty Exam: Review of Systems  Eyes: Negative.   Respiratory: Negative.   Cardiovascular: Negative.   Gastrointestinal: Negative.   Genitourinary: Negative.   Musculoskeletal: Negative.   Skin: Negative.   Neurological: Negative.   Endo/Heme/Allergies: Negative.   Psychiatric/Behavioral: Positive for depression and substance abuse. The patient is nervous/anxious.   All other systems reviewed and are negative.   Blood pressure 135/91, pulse 66, temperature 98 F (36.7 C), temperature source Oral, resp. rate 18,  height 6' 5"  (1.956 m), weight 97.07 kg (214 lb), SpO2 100 %.Body mass index is 25.37 kg/(m^2).  General Appearance: Casual  Eye Contact::  Fair  Speech:  Clear and Coherent and Normal Rate  Volume:  Normal  Mood:  Depressed  Affect:  Depressed and Flat  Thought Process:  Linear and Logical  Orientation:  Full (Time, Place, and Person)  Thought Content:  Hallucinations: None  Suicidal Thoughts:  No  Homicidal Thoughts:  No  Memory:  Immediate;   Fair Recent;   Fair Remote;   Fair  Judgement:  Fair  Insight:  Fair  Psychomotor Activity:  Restlessness  Concentration:  Fair  Recall:  [employer] of Knowledge:Fair  Language: Good  Akathisia:  No  Handed:   Right  AIMS (if indicated):     Assets:  Desire for Improvement Financial Resources/Insurance  ADL's:  Intact  Cognition: WNL  Sleep:  Number of Hours: 6.5    Treatment Plan Summary:  Daily contact with patient to assess and evaluate symptoms and progress in treatment and Medication management  Supportive approach/coping skills Alcohol dependence; Ativan detox protocol/work a relapse prevention plan Cocaine dependence; monitor mood fluctuations from coming off the cocaine Mood instability; reassess for the use of psychotropics Hypothyroidism; resume his synthroid Explore need for a residential treatment program  I agree with current treatment plan on 10/04/2015, Patient seen face-to-face for psychiatric evaluation follow-up, chart reviewed and case discussed with the MD [place].Reviewed the information documented and agree with the treatment plan.  Derrill Center FNP [employer] 10/04/2015, 10:55 AM I agree with assessment and plan Wood[place] Chen. Sabra Heck, M.D.

## 2015-10-04 NOTE — Plan of Care (Signed)
Problem: Alteration in mood & ability to function due to Goal: LTG-Pt reports reduction in suicidal thoughts (Patient reports reduction in suicidal thoughts and is able to verbalize a safety plan for whenever patient is feeling suicidal)  Outcome: Progressing Pt denied SI/HI.

## 2015-10-04 NOTE — Progress Notes (Signed)
Nutrition Brief Note  Patient identified on the Malnutrition Screening Tool (MST) Report  Patient with very little weight loss since 2015.   Wt Readings from Last 15 Encounters:  10/03/15 214 lb (97.07 kg)  10/03/15 214 lb (97.07 kg)  02/11/14 220 lb (99.791 kg)    Body mass index is 25.37 kg/(m^2). Patient meets criteria for overweight based on current BMI.   Diet Order: Diet Heart Room service appropriate?: Yes; Fluid consistency:: Thin Pt is also offered choice of unit snacks mid-morning and mid-afternoon.  Pt is eating as desired.   Labs and medications reviewed.   No nutrition interventions warranted at this time. If nutrition issues arise, please consult RD.   Clayton Bibles, MS, RD, LDN Pager: [phone] After Hours Pager: [phone]

## 2015-10-04 NOTE — BHH Group Notes (Signed)
[place] Group Notes:  (Clinical Social Work)  10/04/2015  10:00-11:00AM  Summary of Progress/Problems:   The main focus of today's process group was to   1)  discuss the importance of adding supports  2)  define health supports versus unhealthy supports  3)  identify the patient's current unhealthy supports and plan how to handle them  4)  Identify the patient's current healthy supports and plan what to add.  An emphasis was placed on using counselor, doctor, therapy groups, 12-step groups, and problem-specific support groups to expand supports.    The patient expressed full comprehension of the concepts presented, and agreed that there is a need to add more supports.  The patient stated he does not have supports other than himself right now, and stated he is his biggest support.  He stated he wants to look into long-term treatment from here.  He talked about his success at sobriety for several years until this past July when he relapsed due to (1) the person he was hanging around whom he has since cut out of his life, and (2) moving back into a neighborhood where he knew how/where to get drugs.    Type of Therapy:  Process Group with Motivational Interviewing  Participation Level:  Active  Participation Quality:  Attentive, Sharing and Supportive  Affect:  Appropriate  Cognitive:  Alert, Appropriate and Oriented  Insight:  Developing/Improving  Engagement in Therapy:  Engaged  Modes of Intervention:   Education, Support and Processing, Activity  Selmer Dominion, LCSW 10/04/2015

## 2015-10-04 NOTE — Progress Notes (Signed)
DAR. Pt present with jovial mood and bright affect. Pt has been very pleasant and cooperative with his care. Pt took his medication as scheduled. No complain has been observed or reported. Pt reported having had a good night, good appetite and normal mood. Pt's safety ensured with 15 minute and environmental checks. Pt currently denies SI/HI and A/V hallucinations. Pt verbally agrees to seek staff if SI/HI or A/VH occurs and to consult with staff before acting on these thoughts. Will continue POC.

## 2015-10-05 LAB — URINALYSIS, ROUTINE W REFLEX MICROSCOPIC
Bilirubin Urine: NEGATIVE
GLUCOSE, UA: NEGATIVE mg/dL
Hgb urine dipstick: NEGATIVE
KETONES UR: NEGATIVE mg/dL
LEUKOCYTES UA: NEGATIVE
NITRITE: NEGATIVE
PROTEIN: NEGATIVE mg/dL
Specific Gravity, Urine: 1.015 (ref 1.005–1.030)
pH: 6 (ref 5.0–8.0)

## 2015-10-05 NOTE — Plan of Care (Signed)
Problem: Alteration in mood & ability to function due to Goal: STG-Patient will report withdrawal symptoms Outcome: Progressing Johnathan Baker reports mild withdrawal symptoms at this time. Ativan protocol is continued.

## 2015-10-05 NOTE — Clinical Social Work Note (Signed)
[facility] and [facility] Referrals faxed this morning per pt request.  Maxie Better, MSW, LCSW Clinical Social Worker 10/05/2015 8:34 AM

## 2015-10-05 NOTE — Progress Notes (Signed)
Pt attended spiritual care group on grief and loss facilitated by chaplain Mylene Bow and counseling intern Kathryn Beam. Group opened with brief discussion and psycho-social ed around grief and loss in relationships and in relation to self - identifying life patterns, circumstances, changes that cause losses. Established group norm of speaking from own life experience. Group goal of establishing open and affirming space for members to share loss and experience with grief, normalize grief experience and provide psycho social education and grief support.    

## 2015-10-05 NOTE — BHH Group Notes (Signed)
[facility] LCSW Aftercare Discharge Planning Group Note   10/05/2015 10:35 AM  Participation Quality:  Invited. Did not attend.   [place], Cianna Kasparian LCSW

## 2015-10-05 NOTE — Progress Notes (Signed)
Pt did not attend evening AA group.

## 2015-10-05 NOTE — Tx Team (Signed)
Interdisciplinary Treatment Plan Update (Adult)  Date:  10/05/2015  Time Reviewed:  10:36 AM   Progress in Treatment: Attending groups: No. Continuing to assess.  Participating in groups:  No. Taking medication as prescribed:  Yes. Tolerating medication:  Yes. Family/Significant othe contact made:  SPE completed with pt, as he refused to consent to family contact.  Patient understands diagnosis:  Yes, as evidenced by seeking treatment for ETOH detox, crack cocaine abuse, and depression/medication stabilization.  Discussing patient identified problems/goals with staff:  Yes. Medical problems stabilized or resolved:  Yes. Denies suicidal/homicidal ideation: Yes. Issues/concerns per patient self-inventory:  Other:  New problem(s) identified:  Pt did not attend morning d/c planning group this morning.   Discharge Plan or Barriers: Pt requesting [facility] referrals. CSW sent referrals this morning. [facility] for outpatient mental health services. Due to recent breakup, pt reports that he is homeless at this time.   Reason for Continuation of Hospitalization: Depression Medication stabilization Withdrawal symptoms  Comments:  44 Y/o male who admits he relapsed on alcohol and crack. He drinks 3-4 beers a week. Goes in [employer]. States he got involved in a relationship. 5 years, married 4 years. States she has been influential in his relapses. He has decided that in order to be able to stay clean he has to end the relatinship  Estimated length of stay:  3-5 days   New goal(s): to develop effective aftercare plan.   Additional Comments:  Patient and CSW reviewed pt's identified goals and treatment plan. Patient verbalized understanding and agreed to treatment plan. CSW reviewed [facility] "Discharge Process and Patient Involvement" Form. Pt verbalized understanding of information provided and signed form.    Review of initial/current patient goals per problem list:  1.  Goal(s): Patient will participate in aftercare plan  Met: No.   Target date: at discharge  As evidenced by: Patient will participate within aftercare plan AEB aftercare provider and housing plan at discharge being identified.  11/28: Referral sent to [facility] today. [facility] for outpatient services. Pt currently homeless. CSW continuing to assess.   2. Goal (s): Patient will exhibit decreased depressive symptoms and suicidal ideations.  Met: No.    Target date: at discharge  As evidenced by: Patient will utilize self rating of depression at 3 or below and demonstrate decreased signs of depression or be deemed stable for discharge by MD.  11/28: Pt rates depression as high. Denies SI/HI/AVH.   3. Goal(s): Patient will demonstrate decreased signs of withdrawal due to substance abuse  Met:Yes   Target date:at discharge   As evidenced by: Patient will produce a CIWA/COWS score of 0, have stable vitals signs, and no symptoms of withdrawal.  11/28: Pt reports no signs of withdrawal with CIWA/COWS of 0 and stable vitals.   Attendees: Patient:   10/05/2015 10:36 AM   Family:   10/05/2015 10:36 AM   Physician:  Dr. Carlton Adam, MD 10/05/2015 10:36 AM   Nursing:   Trinna Post RN; Marian RN 10/05/2015 10:36 AM   Clinical Social Worker: Maxie Better, LCSW 10/05/2015 10:36 AM   Clinical Social Worker: Erasmo Downer Drinkard LCSWA; Peri Maris LCSWA 10/05/2015 10:36 AM   Other:   10/05/2015 10:36 AM   Other:  10/05/2015 10:36 AM   Other:   10/05/2015 10:36 AM   Other:  10/05/2015 10:36 AM   Other:  10/05/2015 10:36 AM   Other:  10/05/2015 10:36 AM    10/05/2015 10:36 AM    10/05/2015 10:36 AM  10/05/2015 10:36 AM    10/05/2015 10:36 AM    Scribe for Treatment Team:    Smart, LCSW 10/05/2015 10:36 AM      

## 2015-10-05 NOTE — Progress Notes (Signed)
Patient ID: Johnathan Baker, male   DOB: 1971-06-13, 44 y.o.   MRN: [id number]  DAR: Pt. Denies HI and A/V Hallucinations. He reports passive SI however is able to contract for safety. Patient does report some pain in lower back and received PRN Tylenol which provided some relief. Patient's temperature is elevated, NP Joaquin Courts and MD [place] were notified. He reports sleep is fair, appetite is good, energy level is low, and concentration is poor. He rates depression 8/10, hopelessness 8/10, and anxiety 2/10. Support and encouragement provided to the patient. Scheduled medications administered to patient per physician's orders. Patient is receptive and cooperative. He is seen in the milieu at times but remains to himself mostly. Q15 minute checks are maintained for safety.

## 2015-10-05 NOTE — BHH Group Notes (Signed)
[place] LCSW Group Therapy  10/05/2015 11:23 AM  Type of Therapy:  Group Therapy  Participation Level:  Active  Participation Quality:  Attentive  Affect:  Appropriate  Cognitive:  Alert and Oriented  Insight:  Engaged  Engagement in Therapy:  Improving  Modes of Intervention:  Confrontation, Discussion, Education, Exploration, Problem-solving, Rapport Building, Socialization and Support  Summary of Progress/Problems: Today's Topic: Overcoming Obstacles. Patients identified one short term goal and potential obstacles in reaching this goal. Patients processed barriers involved in overcoming these obstacles. Patients identified steps necessary for overcoming these obstacles and explored motivation (internal and external) for facing these difficulties head on. Johnathan Baker was attentive and engaged during today's processing group. He shared that his biggest obstacle is "getting into recovery" and stated that he is ready to "endure 28 days of treatment." Johnathan Baker was receptive to learning about [facility] from another pt for after his inpatient treatment is completed. He continues to show progress in the group setting.   Smart, Kayti Poss LCSW 10/05/2015, 11:23 AM

## 2015-10-05 NOTE — Plan of Care (Signed)
Problem: Alteration in mood & ability to function due to Goal: STG-Patient will comply with prescribed medication regimen (Patient will comply with prescribed medication regimen)  Outcome: Progressing Pt is medication compliant     

## 2015-10-05 NOTE — Progress Notes (Addendum)
[facility] MD Progress Note  10/05/2015 4:47 PM Johnathan Baker  MRN:  [id number] Subjective:  Johnathan Baker has some back pain but states this is chronic in nature. He had a temperature early on. His vitals BP pulse are WNL. He is committed to pursuing residential treatment. States he really wants to stay clean Principal Problem: Substance induced mood disorder ([place]) Diagnosis:   Patient Active Problem List   Diagnosis Date Noted  . Substance induced mood disorder ([place]) [F19.94] 10/03/2015  . Cocaine dependence with cocaine-induced mood disorder ([place]) [F14.24] 10/03/2015  . Alcohol dependence ([place]) [F10.20] 10/03/2015  . Hypothyroidism [E03.9] 10/03/2015   Total Time spent with patient: 20 minutes  Past Psychiatric History: see admission H and P  Past Medical History:  Past Medical History  Diagnosis Date  . Thyroid disease   . Back pain   . Depression   . Hypertension    History reviewed. No pertinent past surgical history. Family History: History reviewed. No pertinent family history. Family Psychiatric  History: see admission H and P Social History:  History  Alcohol Use No    Comment: occ     History  Drug Use  . 0.50 per week  . Special: Cocaine    Social History   Social History  . Marital Status: Single    Spouse Name: N/A  . Number of Children: N/A  . Years of Education: N/A   Social History Main Topics  . Smoking status: Current Every Day Smoker -- 0.50 packs/day    Types: Cigarettes  . Smokeless tobacco: None  . Alcohol Use: No     Comment: occ  . Drug Use: 0.50 per week    Special: Cocaine  . Sexual Activity: Not Asked   Other Topics Concern  . None   Social History Narrative   Additional Social History:    Pain Medications: See PTA medication list Prescriptions: Gabapenten was one of the meds.  Unsure of what one of them. Over the Counter: None History of alcohol / drug use?: Yes Longest period of sobriety (when/how long): unsure Negative Consequences of  Use: Personal relationships Withdrawal Symptoms: Irritability Name of Substance 1: Crack 1 - Age of First Use: 44 years of age 46 - Amount (size/oz): Varies 1 - Frequency: 2-3 times in a month 1 - Duration: On-going 1 - Last Use / Amount: 11/24                  Sleep: Fair  Appetite:  Fair  Current Medications: Current Facility-Administered Medications  Medication Dose Route Frequency Provider Last Rate Last Dose  . acetaminophen (TYLENOL) tablet 650 mg  650 mg Oral Q6H PRN Niel Hummer, NP   650 mg at 10/05/15 0826  . alum & mag hydroxide-simeth (MAALOX/MYLANTA) 200-200-20 MG/5ML suspension 30 mL  30 mL Oral Q4H PRN Niel Hummer, NP      . hydrOXYzine (ATARAX/VISTARIL) tablet 25 mg  25 mg Oral Q6H PRN Niel Hummer, NP   25 mg at 10/03/15 2122  . levothyroxine (SYNTHROID, LEVOTHROID) tablet 200 mcg  200 mcg Oral QAC breakfast Nicholaus Bloom, MD   200 mcg at 10/05/15 0604  . loperamide (IMODIUM) capsule 2-4 mg  2-4 mg Oral PRN Nicholaus Bloom, MD      . LORazepam (ATIVAN) tablet 1 mg  1 mg Oral Q6H PRN Nicholaus Bloom, MD      . LORazepam (ATIVAN) tablet 1 mg  1 mg Oral TID Nicholaus Bloom, MD  1 mg at 10/05/15 1219   Followed by  . [START ON 10/06/2015] LORazepam (ATIVAN) tablet 1 mg  1 mg Oral BID Nicholaus Bloom, MD       Followed by  . [START ON 10/07/2015] LORazepam (ATIVAN) tablet 1 mg  1 mg Oral Daily Nicholaus Bloom, MD      . magnesium hydroxide (MILK OF MAGNESIA) suspension 30 mL  30 mL Oral Daily PRN Niel Hummer, NP      . ondansetron (ZOFRAN-ODT) disintegrating tablet 4 mg  4 mg Oral Q6H PRN Nicholaus Bloom, MD      . traZODone (DESYREL) tablet 50 mg  50 mg Oral QHS PRN Niel Hummer, NP   50 mg at 10/04/15 2124    Lab Results: No results found for this or any previous visit (from the past 36 hour(s)).  Physical Findings: AIMS: Facial and Oral Movements Muscles of Facial Expression: None, normal Lips and Perioral Area: None, normal Jaw: None, normal Tongue: None,  normal,Extremity Movements Upper (arms, wrists, hands, fingers): None, normal Lower (legs, knees, ankles, toes): None, normal, Trunk Movements Neck, shoulders, hips: None, normal, Overall Severity Severity of abnormal movements (highest score from questions above): None, normal Incapacitation due to abnormal movements: None, normal Patient's awareness of abnormal movements (rate only patient's report): No Awareness, Dental Status Current problems with teeth and/or dentures?: Yes Does patient usually wear dentures?: No  CIWA:  CIWA-Ar Total: 1 COWS:  COWS Total Score: 0  Musculoskeletal: Strength & Muscle Tone: within normal limits Gait & Station: normal Patient leans: normal  Psychiatric Specialty Exam: Review of Systems  Constitutional: Positive for malaise/fatigue.  HENT: Negative.   Eyes: Negative.   Respiratory: Negative.   Cardiovascular: Negative.   Gastrointestinal: Negative.   Genitourinary: Negative.   Musculoskeletal: Positive for back pain.  Skin: Negative.   Neurological: Positive for weakness.  Endo/Heme/Allergies: Negative.   Psychiatric/Behavioral: Positive for substance abuse. The patient is nervous/anxious.     Blood pressure 127/70, pulse 79, temperature 99.3 F (37.4 C), temperature source Oral, resp. rate 16, height 6\' 5"  (1.956 m), weight 97.07 kg (214 lb), SpO2 100 %.Body mass index is 25.37 kg/(m^2).  General Appearance: Fairly Groomed  Engineer, water::  Fair  Speech:  Clear and Coherent  Volume:  Normal  Mood:  Anxious  Affect:  Restricted  Thought Process:  Coherent and Goal Directed  Orientation:  Full (Time, Place, and Person)  Thought Content:  symptoms events worries concerns  Suicidal Thoughts:  No  Homicidal Thoughts:  No  Memory:  Immediate;   Fair Recent;   Fair Remote;   Fair  Judgement:  Fair  Insight:  Present  Psychomotor Activity:  Normal  Concentration:  Fair  Recall:  [employer] [place]  Language: Fair  Akathisia:   No  Handed:  Right  AIMS (if indicated):     Assets:  Desire for Improvement  ADL's:  Intact  Cognition: WNL  Sleep:  Number of Hours: 5.25   Treatment Plan Summary: Daily contact with patient to assess and evaluate symptoms and progress in treatment and Medication management Supportive approach/coping skills Alcohol dependence; continue the Ativan Detox protocol/work a relapse prevention plan Cocaine abuse; continue to work a relapse prevention plan Hypothyroidism; he was started on Synthroid 200 mcg daily Insomnia; continue the Trazodone 50 mg HS Will continue to monitor his VS. A U/A was ordered Work with CBT/mindfulness Leauna Sharber A 10/05/2015, 4:47 PM

## 2015-10-06 NOTE — Progress Notes (Signed)
Recreation Therapy Notes  Animal-Assisted Activity (AAA) Program Checklist/Progress Notes Patient Eligibility Criteria Checklist & Daily Group note for Rec Tx Intervention  Date: 11.29.2016 Time: 2:45pm Location: [address] Film/[address]    AAA/T Program Assumption of Risk Form signed by Patient/ or Parent Legal Guardian yes  Patient is free of allergies or sever asthma yes  Patient reports no fear of animals yes  Patient reports no history of cruelty to animals yes  Patient understands his/her participation is voluntary yes  Patient washes hands before animal contact yes  Patient washes hands after animal contact yes  Behavioral Response: Appropriate   Education: Hand Washing, Appropriate Animal Interaction   Education Outcome: Acknowledges education.   Clinical Observations/Feedback: Patient engaged appropriately with therapy dog and peers in session.    Laureen Ochs Renuka Farfan, LRT/CTRS  Laurent Cargile L 10/06/2015 3:13 PM

## 2015-10-06 NOTE — Progress Notes (Signed)
Patient ID: Johnathan Baker, male   DOB: October 09, 1971, 44 y.o.   MRN: [id number]  DAR: Pt. Denies SI/HI and A/V Hallucinations. He does report sweats as a result from withdrawal but no other withdrawals noted or expressed. He reports sleep is fair, appetite is good, energy level is normal, and concentration is good. He rates depression, anxiety, and hopelessness 5/10. Patient does not report any pain or discomfort at this time. Support and encouragement provided to the patient. Scheduled medications administered to patient per physician's orders. Patient is minimal but cooperative. He reports feeling conflicted about getting his life together and ending the relationship with his "wife." He states he is not sure if, "I'm doing the right thing." Writer encouraged patient to think about his sobriety. Before patient was able to speak about why he felt that way, his wife called so the conversation ended. Q15 minute checks are maintained for safety.

## 2015-10-06 NOTE — Progress Notes (Signed)
Adult Psychoeducational Group Note  Date:  10/06/2015 Time:  8:20 PM  Group Topic/Focus:  Wrap-Up Group:   The focus of this group is to help patients review their daily goal of treatment and discuss progress on daily workbooks.  Participation Level:  Did Not Attend  Pt appeared to be asleep during wrap-up group meetings.    Lincoln Brigham 10/06/2015, 9:21 PM

## 2015-10-06 NOTE — Progress Notes (Signed)
[facility] MD Progress Note  10/06/2015 2:23 PM Johnathan Baker  MRN:  [id number] Subjective:  Johnathan Baker endorses that he slept better last night. He states that he is committed to go to rehab and to work on long term abstinence. He is concerned about hot having a place to live. States that he works and hopes to still be able to keep the job. He is not sure what is going to happen with his relationship. Right now he states he can just worry about himself. Principal Problem: Substance induced mood disorder ([place]) Diagnosis:   Patient Active Problem List   Diagnosis Date Noted  . Substance induced mood disorder ([place]) [F19.94] 10/03/2015  . Cocaine dependence with cocaine-induced mood disorder ([place]) [F14.24] 10/03/2015  . Alcohol dependence ([place]) [F10.20] 10/03/2015  . Hypothyroidism [E03.9] 10/03/2015   Total Time spent with patient: 20 minutes  Past Psychiatric History: see admission H and P  Past Medical History:  Past Medical History  Diagnosis Date  . Thyroid disease   . Back pain   . Depression   . Hypertension    History reviewed. No pertinent past surgical history. Family History: History reviewed. No pertinent family history. Family Psychiatric  History: see admission H and P Social History:  History  Alcohol Use No    Comment: occ     History  Drug Use  . 0.50 per week  . Special: Cocaine    Social History   Social History  . Marital Status: Single    Spouse Name: N/A  . Number of Children: N/A  . Years of Education: N/A   Social History Main Topics  . Smoking status: Current Every Day Smoker -- 0.50 packs/day    Types: Cigarettes  . Smokeless tobacco: None  . Alcohol Use: No     Comment: occ  . Drug Use: 0.50 per week    Special: Cocaine  . Sexual Activity: Not Asked   Other Topics Concern  . None   Social History Narrative   Additional Social History:    Pain Medications: See PTA medication list Prescriptions: Gabapenten was one of the meds.  Unsure of what  one of them. Over the Counter: None History of alcohol / drug use?: Yes Longest period of sobriety (when/how long): unsure Negative Consequences of Use: Personal relationships Withdrawal Symptoms: Irritability Name of Substance 1: Crack 1 - Age of First Use: 44 years of age 28 - Amount (size/oz): Varies 1 - Frequency: 2-3 times in a month 1 - Duration: On-going 1 - Last Use / Amount: 11/24                  Sleep: Fair  Appetite:  Fair  Current Medications: Current Facility-Administered Medications  Medication Dose Route Frequency Provider Last Rate Last Dose  . acetaminophen (TYLENOL) tablet 650 mg  650 mg Oral Q6H PRN Niel Hummer, NP   650 mg at 10/05/15 0826  . alum & mag hydroxide-simeth (MAALOX/MYLANTA) 200-200-20 MG/5ML suspension 30 mL  30 mL Oral Q4H PRN Niel Hummer, NP      . hydrOXYzine (ATARAX/VISTARIL) tablet 25 mg  25 mg Oral Q6H PRN Niel Hummer, NP   25 mg at 10/03/15 2122  . levothyroxine (SYNTHROID, LEVOTHROID) tablet 200 mcg  200 mcg Oral QAC breakfast Nicholaus Bloom, MD   200 mcg at 10/06/15 [phone]  . loperamide (IMODIUM) capsule 2-4 mg  2-4 mg Oral PRN Nicholaus Bloom, MD      . LORazepam (ATIVAN) tablet  1 mg  1 mg Oral Q6H PRN Nicholaus Bloom, MD      . LORazepam (ATIVAN) tablet 1 mg  1 mg Oral BID Nicholaus Bloom, MD   1 mg at 10/06/15 0815   Followed by  . [START ON 10/07/2015] LORazepam (ATIVAN) tablet 1 mg  1 mg Oral Daily Nicholaus Bloom, MD      . magnesium hydroxide (MILK OF MAGNESIA) suspension 30 mL  30 mL Oral Daily PRN Niel Hummer, NP      . ondansetron (ZOFRAN-ODT) disintegrating tablet 4 mg  4 mg Oral Q6H PRN Nicholaus Bloom, MD      . traZODone (DESYREL) tablet 50 mg  50 mg Oral QHS PRN Niel Hummer, NP   50 mg at 10/04/15 2124    Lab Results:  Results for orders placed or performed during the hospital encounter of 10/03/15 (from the past 48 hour(s))  Urinalysis, Routine w reflex microscopic (not at [facility])     Status: None   Collection Time:  10/05/15  7:05 PM  Result Value Ref Range   Color, Urine YELLOW YELLOW   APPearance CLEAR CLEAR   Specific Gravity, Urine 1.015 1.005 - 1.030   pH 6.0 5.0 - 8.0   Glucose, UA NEGATIVE NEGATIVE mg/dL   Hgb urine dipstick NEGATIVE NEGATIVE   Bilirubin Urine NEGATIVE NEGATIVE   Ketones, ur NEGATIVE NEGATIVE mg/dL   Protein, ur NEGATIVE NEGATIVE mg/dL   Nitrite NEGATIVE NEGATIVE   Leukocytes, UA NEGATIVE NEGATIVE    Comment: MICROSCOPIC NOT DONE ON URINES WITH NEGATIVE PROTEIN, BLOOD, LEUKOCYTES, NITRITE, OR GLUCOSE <1000 mg/dL. Performed at [facility]     Physical Findings: AIMS: Facial and Oral Movements Muscles of Facial Expression: None, normal Lips and Perioral Area: None, normal Jaw: None, normal Tongue: None, normal,Extremity Movements Upper (arms, wrists, hands, fingers): None, normal Lower (legs, knees, ankles, toes): None, normal, Trunk Movements Neck, shoulders, hips: None, normal, Overall Severity Severity of abnormal movements (highest score from questions above): None, normal Incapacitation due to abnormal movements: None, normal Patient's awareness of abnormal movements (rate only patient's report): No Awareness, Dental Status Current problems with teeth and/or dentures?: Yes Does patient usually wear dentures?: No  CIWA:  CIWA-Ar Total: 7 COWS:  COWS Total Score: 0  Musculoskeletal: Strength & Muscle Tone: within normal limits Gait & Station: normal Patient leans: normal  Psychiatric Specialty Exam: Review of Systems  Constitutional: Negative.   HENT: Negative.   Eyes: Negative.   Respiratory: Negative.   Cardiovascular: Negative.   Gastrointestinal: Negative.   Genitourinary: Negative.   Musculoskeletal: Negative.   Skin: Negative.   Neurological: Negative.   Endo/Heme/Allergies: Negative.   Psychiatric/Behavioral: Positive for substance abuse.    Blood pressure 127/81, pulse 81, temperature 98.5 F (36.9 C), temperature source  Oral, resp. rate 12, height 6\' 5"  (1.956 m), weight 97.07 kg (214 lb), SpO2 100 %.Body mass index is 25.37 kg/(m^2).  General Appearance: Fairly Groomed  Engineer, water::  Fair  Speech:  Clear and Coherent  Volume:  Normal  Mood:  worried  Affect:  anxious worried  Thought Process:  Coherent and Goal Directed  Orientation:  Full (Time, Place, and Person)  Thought Content:  symptoms events worries concerns  Suicidal Thoughts:  No  Homicidal Thoughts:  No  Memory:  Immediate;   Fair Recent;   Fair Remote;   Fair  Judgement:  Fair  Insight:  Present  Psychomotor Activity:  Normal  Concentration:  Fair  Recall:  Smiley Houseman of Knowledge:Fair  Language: Fair  Akathisia:  No  Handed:  Right  AIMS (if indicated):     Assets:  Desire for Improvement  ADL's:  Intact  Cognition: WNL  Sleep:  Number of Hours: 6.25   Treatment Plan Summary: Daily contact with patient to assess and evaluate symptoms and progress in treatment and Medication management Supportive approach/coping skills Alcohol dependence; complete the detox protocol/work a relapse prevention plan Insomnia: continue the Trazodone 50 mg Hypothyroidism; continue the synthroid 200 mcg Facilitate admission to [facility] in AM Caliegh Middlekauff A 10/06/2015, 2:23 PM

## 2015-10-06 NOTE — BHH Group Notes (Signed)
[place] LCSW Group Therapy  10/06/2015 1:04 PM  Type of Therapy:  Group Therapy  Participation Level:  Active  Participation Quality:  Attentive  Affect:  Appropriate  Cognitive:  Alert and Oriented  Insight:  Improving  Engagement in Therapy:  Improving  Modes of Intervention:  Discussion, Education, Exploration, Problem-solving, Rapport Building, Socialization and Support  Summary of Progress/Problems: [facility] Speaker came to talk about his personal journey with substance abuse and addiction. The pt processed ways by which to relate to the speaker. [place] speaker provided handouts and educational information pertaining to groups and services offered by the [facility].   Smart, Pharell Rolfson LCSW 10/06/2015, 1:04 PM

## 2015-10-06 NOTE — BHH Group Notes (Signed)
[place] Group Notes:  (Nursing/MHT/Case Management/Adjunct)  Date:  10/06/2015  Time:  10:39 AM  Type of Therapy:  Psychoeducational Skills  Participation Level:  Did Not Attend  Participation Quality:  N/A  Affect:  N/A  Cognitive:  N/A  Insight:  None  Engagement in Group:  None  Modes of Intervention:  Discussion and Education  Summary of Progress/Problems: Patient was invited to group but chose not to attend.   Gaylan Gerold E 10/06/2015, 10:39 AM

## 2015-10-06 NOTE — Plan of Care (Signed)
Problem: Diagnosis: Increased Risk For Suicide Attempt Goal: STG-Patient Will Comply With Medication Regime Outcome: Progressing Johnathan Baker is compliant with medication agreement.

## 2015-10-07 DIAGNOSIS — E039 Hypothyroidism, unspecified: Secondary | ICD-10-CM

## 2015-10-07 DIAGNOSIS — F10230 Alcohol dependence with withdrawal, uncomplicated: Secondary | ICD-10-CM | POA: Insufficient documentation

## 2015-10-07 MED ORDER — NICOTINE POLACRILEX 2 MG MT GUM
2.0000 mg | CHEWING_GUM | OROMUCOSAL | Status: DC | PRN
  Administered 2015-10-07: 2 mg via ORAL

## 2015-10-07 MED ORDER — LEVOTHYROXINE SODIUM 200 MCG PO TABS: 200.0000 ug | ORAL_TABLET | Freq: Every day | ORAL | Status: DC

## 2015-10-07 MED ORDER — HYDROXYZINE HCL 25 MG PO TABS: 25.0000 mg | ORAL_TABLET | Freq: Four times a day (QID) | ORAL | Status: DC | PRN

## 2015-10-07 MED ORDER — NICOTINE POLACRILEX 2 MG MT GUM: 2.0000 mg | CHEWING_GUM | OROMUCOSAL | Status: DC | PRN

## 2015-10-07 MED ORDER — TRAZODONE HCL 50 MG PO TABS: 50.0000 mg | ORAL_TABLET | Freq: Every evening | ORAL | Status: DC | PRN

## 2015-10-07 NOTE — Discharge Summary (Signed)
Physician Discharge Summary Note  Patient:  Johnathan Baker is an 44 y.o., male MRN:  [id number] DOB:  06-28-1971 Patient phone:  [phone] (home)  Patient address:   [place] [address],  Total Time spent with patient: Greater than 30 minutes  Date of Admission:  10/03/2015  Date of Discharge: 10-07-15  Reason for Admission: Suicidal ideations/drug & alcohol detox  Principal Problem: Substance induced mood disorder [name]) Discharge Diagnoses: Patient Active Problem List   Diagnosis Date Noted  . Alcohol dependence with uncomplicated withdrawal ([place]) [F10.230]   . Thyroid activity decreased [E03.9]   . Substance induced mood disorder ([place]) [F19.94] 10/03/2015  . Cocaine dependence with cocaine-induced mood disorder ([place]) [F14.24] 10/03/2015  . Alcohol dependence ([place]) [F10.20] 10/03/2015  . Hypothyroidism [E03.9] 10/03/2015   Past Psychiatric History: Polysubstance dependence  Past Medical History:  Past Medical History  Diagnosis Date  . Thyroid disease   . Back pain   . Depression   . Hypertension    History reviewed. No pertinent past surgical history.  Family History: History reviewed. No pertinent family history.  Family Psychiatric  History: See H&P  Social History:  History  Alcohol Use No    Comment: occ     History  Drug Use  . 0.50 per week  . Special: Cocaine    Social History   Social History  . Marital Status: Single    Spouse Name: N/A  . Number of Children: N/A  . Years of Education: N/A   Social History Main Topics  . Smoking status: Current Every Day Smoker -- 0.50 packs/day    Types: Cigarettes  . Smokeless tobacco: None  . Alcohol Use: No     Comment: occ  . Drug Use: 0.50 per week    Special: Cocaine  . Sexual Activity: Not Asked   Other Topics Concern  . None   Social History Narrative   Hospital Course: Y/o male who admits he relapsed on alcohol and crack. He drinks 3-4 beers a week. Goes in Crack binges. States  he got involved in a relationship. 5 years, married 4 years. States she has been influential in his relapses. He has decided that in order to be able to stay clean he has to end the relationship.  Johnathan Baker was admitted to the adult unit with a UDS results positive for cocaine & THC. He did admit having been drinking a lot. He required detoxification treatments. His liver enzyme (AST) was elevated per recent lab results. As a result not a candidate for Librium detoxification treatment protocols. Johnathan Baker received Ativan detoxification treatment protocols for alcohol/deug detox. He was also enrolled & participated in the group counseling sessions being offered & held on this unit. He learned coping skills that should help him cope better & manage his substance abuse issues after discharge.   Besides the detoxification treatment protocols, Johnathan Baker also was medicated & discharged on; Hydroxyzine 25 mg for anxiety & Trazodone 50 mg for insomnia. He was resumed on all his pertinent home medications for his other pre-existing medical issues that he presented. He tolerated his treatment regimen without any significant adverse effects or reactions reported.  Johnathan Baker has completed detoxification treatments & his mood is stable. This is evidenced by his reports of improved mood & absence of substance withdrawal symptoms. He is being referred to the [facility] treatment center for further substance abuse treatments. However, Johnathan Baker declines this offer. He has chosen to go home instead to follow-up care on an outpatient basis.  Upon  his hospital discharge, he adamantly denies any SIHI, AVH, delusional thoughts, paranoia & or substance withdrawal symptoms. He is currently being discharged to follow-up care as noted below. He is provided with all the pertinent information to make it to to this appointment without problems. He was provided with a 7 days worth supply samples of his [facility] discharge medications. He left [name] with all personal  belongings in no apparent distress. Transportation per city bus/family. Johnathan Baker assisted with bus pass.  Physical Findings:  AIMS: Facial and Oral Movements Muscles of Facial Expression: None, normal Lips and Perioral Area: None, normal Jaw: None, normal Tongue: None, normal,Extremity Movements Upper (arms, wrists, hands, fingers): None, normal Lower (legs, knees, ankles, toes): None, normal, Trunk Movements Neck, shoulders, hips: None, normal, Overall Severity Severity of abnormal movements (highest score from questions above): None, normal Incapacitation due to abnormal movements: None, normal Patient's awareness of abnormal movements (rate only patient's report): No Awareness, Dental Status Current problems with teeth and/or dentures?: Yes Does patient usually wear dentures?: No  CIWA:  CIWA-Ar Total: 0 COWS:  COWS Total Score: 0  Musculoskeletal: Strength & Muscle Tone: within normal limits Gait & Station: normal Patient leans: N/A  Psychiatric Specialty Exam: Review of Systems  Constitutional: Negative.   HENT: Negative.   Eyes: Negative.   Respiratory: Negative.   Cardiovascular: Negative.   Gastrointestinal: Negative.   Genitourinary: Negative.   Musculoskeletal: Negative.   Skin: Negative.   Neurological: Negative.   Psychiatric/Behavioral: Positive for depression (Stable) and substance abuse (Alcohol/cocaine dependence). Negative for suicidal ideas, hallucinations and memory loss. The patient has insomnia (Stable). The patient is not nervous/anxious.     Blood pressure 143/85, pulse 98, temperature 98.4 F (36.9 C), temperature source Oral, resp. rate 16, height 6\' 5"  (1.956 m), weight 97.07 kg (214 lb), SpO2 100 %.Body mass index is 25.37 kg/(m^2).  See Md's SRA   Have you used any form of tobacco in the last 30 days? (Cigarettes, Smokeless Tobacco, Cigars, and/or Pipes): Yes  Has this patient used any form of tobacco in the last 30 days? (Cigarettes, Smokeless  Tobacco, Cigars, and/or Pipes) Yes, Yes, A prescription for an FDA-approved tobacco cessation medication was offered at discharge and the patient refused  Metabolic Disorder Labs:  No results found for: HGBA1C, MPG No results found for: PROLACTIN Lab Results  Component Value Date   CHOL 165 02/11/2014   TRIG 80 02/11/2014   HDL 76 02/11/2014   CHOLHDL 2.2 02/11/2014   VLDL 16 02/11/2014   LDLCALC 73 02/11/2014    See Psychiatric Specialty Exam and Suicide Risk Assessment completed by Attending Physician prior to discharge.  Discharge destination:  Home  Is patient on multiple antipsychotic therapies at discharge:  No   Has Patient had three or more failed trials of antipsychotic monotherapy by history:  No  Recommended Plan for Multiple Antipsychotic Therapies: NA    Medication List    TAKE these medications      Indication   hydrOXYzine 25 MG tablet  Commonly known as:  ATARAX/VISTARIL  Take 1 tablet (25 mg total) by mouth every 6 (six) hours as needed for anxiety.   Indication:  Tension, Anxiety     levothyroxine 200 MCG tablet  Commonly known as:  SYNTHROID, LEVOTHROID  Take 1 tablet (200 mcg total) by mouth daily before breakfast. For low functioning thyroid gland   Indication:  Underactive Thyroid     nicotine polacrilex 2 MG gum  Commonly known as:  NICORETTE  Take  1 each (2 mg total) by mouth as needed for smoking cessation.   Indication:  Nicotine Addiction     traZODone 50 MG tablet  Commonly known as:  DESYREL  Take 1 tablet (50 mg total) by mouth at bedtime as needed for sleep.   Indication:  Trouble Sleeping       Follow-up Information    Follow up with [facility].   Why:  Walk in between 8am-9am Monday through Friday for hospital follow-up/medication management/assessment for mental health services.    Contact information:   [address], [place] [address] Phone: [phone] Fax: [phone]     Follow-up recommendations:  Activity:   As tolerated Diet: As recommended by your primary care doctor. Keep all scheduled follow-up appointments as recommended.  Comments: Take all your medications as prescribed by your mental healthcare provider. Report any adverse effects and or reactions from your medicines to your outpatient provider promptly. Patient is instructed and cautioned to not engage in alcohol and or illegal drug use while on prescription medicines. In the event of worsening symptoms, patient is instructed to call the crisis hotline, 911 and or go to the nearest ED for appropriate evaluation and treatment of symptoms. Follow-up with your primary care provider for your other medical issues, concerns and or health care needs.   Signed: Encarnacion Slates, PMHNP, FNP-BC 10/07/2015, 2:01 PM  I personally assessed the patient and formulated the plan Geralyn Flash A. Sabra Heck, M.D.

## 2015-10-07 NOTE — BHH Suicide Risk Assessment (Signed)
[facility] Discharge Suicide Risk Assessment   Demographic Factors:  Male  Total Time spent with patient: 30 minutes  Musculoskeletal: Strength & Muscle Tone: within normal limits Gait & Station: normal Patient leans: normal  Psychiatric Specialty Exam: Physical Exam  Review of Systems  Constitutional: Negative.   HENT: Negative.   Eyes: Negative.   Respiratory: Negative.   Cardiovascular: Negative.   Gastrointestinal: Negative.   Genitourinary: Negative.   Musculoskeletal: Positive for back pain.  Skin: Negative.   Neurological: Negative.   Endo/Heme/Allergies: Negative.   Psychiatric/Behavioral: Positive for substance abuse.    Blood pressure 143/85, pulse 98, temperature 98.4 F (36.9 C), temperature source Oral, resp. rate 16, height 6\' 5"  (1.956 m), weight 97.07 kg (214 lb), SpO2 100 %.Body mass index is 25.37 kg/(m^2).  General Appearance: Fairly Groomed  Engineer, water::  Fair  Speech:  Clear and [id number]  Volume:  Normal  Mood:  Euthymic  Affect:  Appropriate  Thought Process:  Coherent and Goal Directed  Orientation:  Full (Time, Place, and Person)  Thought Content:  plans as he moves on, relapse prevention plan  Suicidal Thoughts:  No  Homicidal Thoughts:  No  Memory:  Immediate;   Fair Recent;   Fair Remote;   Fair  Judgement:  Fair  Insight:  Present  Psychomotor Activity:  Normal  Concentration:  Fair  Recall:  [employer] [place]  Language: Fair  Akathisia:  No  Handed:  Right  AIMS (if indicated):     Assets:  Desire for Improvement Housing Social Support  Sleep:  Number of Hours: 3.75  Cognition: WNL  ADL's:  Intact   Have you used any form of tobacco in the last 30 days? (Cigarettes, Smokeless Tobacco, Cigars, and/or Pipes): Yes  Has this patient used any form of tobacco in the last 30 days? (Cigarettes, Smokeless Tobacco, Cigars, and/or Pipes) Yes, A prescription for an FDA-approved tobacco cessation medication was offered at  discharge and the patient refused  Mental Status Per Nursing Assessment::   On Admission:     Current Mental Status by Physician: In full contact with reality. There are no active S/S of withdrawal. There are no active SI plans or intent. He states he was going to [facility] but he changed his mind. He is going to stay with family. They are supportive and thinks it is going to work out. He plans to keep his job   Loss Factors: NA  Historical Factors: NA  Risk Reduction Factors:   Sense of responsibility to family, Employed, Living with another person, especially a relative and Positive social support  Continued Clinical Symptoms:  Alcohol/Substance Abuse/Dependencies  Cognitive Features That Contribute To Risk:  Closed-mindedness, Polarized thinking and Thought constriction (tunnel vision)    Suicide Risk:  Minimal: No identifiable suicidal ideation.  Patients presenting with no risk factors but with morbid ruminations; may be classified as minimal risk based on the severity of the depressive symptoms  Principal Problem: Substance induced mood disorder [facility]) Discharge Diagnoses:  Patient Active Problem List   Diagnosis Date Noted  . Substance induced mood disorder ([place]) [F19.94] 10/03/2015  . Cocaine dependence with cocaine-induced mood disorder ([place]) [F14.24] 10/03/2015  . Alcohol dependence ([place]) [F10.20] 10/03/2015  . Hypothyroidism [E03.9] 10/03/2015    Follow-up Information    Follow up with [facility].   Why:  Walk in between 8am-9am Monday through Friday for hospital follow-up/medication management/assessment for mental health services.    Contact information:   [address]  [place],  [place] [address] Phone: [phone] Fax: [phone]      Plan Of Care/Follow-up recommendations:  Activity:  as tolerated Diet:  regular Follow up [facility] Is patient on multiple antipsychotic therapies at discharge:  No   Has Patient had three or more failed trials of antipsychotic  monotherapy by history:  No  Recommended Plan for Multiple Antipsychotic Therapies: NA    Johnathan Baker A 10/07/2015, 12:38 PM

## 2015-10-07 NOTE — Tx Team (Signed)
Interdisciplinary Treatment Plan Update (Adult)  Date:  10/07/2015  Time Reviewed:  10:16 AM   Progress in Treatment: Attending groups: Yes Participating in groups:  Yes Taking medication as prescribed:  Yes. Tolerating medication:  Yes. Family/Significant othe contact made:  SPE completed with pt, as he refused to consent to family contact.  Patient understands diagnosis:  Yes, as evidenced by seeking treatment for ETOH detox, crack cocaine abuse, and depression/medication stabilization.  Discussing patient identified problems/goals with staff:  Yes. Medical problems stabilized or resolved:  Yes. Denies suicidal/homicidal ideation: Yes. Issues/concerns per patient self-inventory:  Other:  Discharge Plan or Barriers: Pt accepted to [facility] for today-pt declined bed in group this morning despite encouragement from CSW and RN. Pt stated that he is worried about losing job if he goes to treatment and plans to live with his parents at d/c. [facility] for outpatient care. Mental health association info and IRC information provided.   Reason for Continuation of Hospitalization: None  Comments:  44 Y/o male who admits he relapsed on alcohol and crack. He drinks 3-4 beers a week. Goes in Crack binges. States he got involved in a relationship. 5 years, married 4 years. States she has been influential in his relapses. He has decided that in order to be able to stay clean he has to end the relatinship  Estimated length of stay:  D/c today   Additional Comments:  Patient and CSW reviewed pt's identified goals and treatment plan. Patient verbalized understanding and agreed to treatment plan. CSW reviewed [facility] "Discharge Process and Patient Involvement" Form. Pt verbalized understanding of information provided and signed form.    Review of initial/current patient goals per problem list:  1. Goal(s): Patient will participate in aftercare plan  Met: Yes  Target date: at discharge  As evidenced  by: Patient will participate within aftercare plan AEB aftercare provider and housing plan at discharge being identified.  11/28: Referral [facility]. [facility] for outpatient services. Pt currently homeless. CSW continuing to assess.   11/20: Pt accepted to [facility] but declined bed. [facility] for o/p and Mental health association for peer support. Pt plans to stay with a friend at d/c.   2. Goal (s): Patient will exhibit decreased depressive symptoms and suicidal ideations.  Met: yes   Target date: at discharge  As evidenced by: Patient will utilize self rating of depression at 3 or below and demonstrate decreased signs of depression or be deemed stable for discharge by MD.  11/28: Pt rates depression as high. Denies SI/HI/AVH.   11/30: Pt rates depression as 0/10 and presents with pleasant mood/slightly anxious affect. Denies SI/HI/AVH.   3. Goal(s): Patient will demonstrate decreased signs of withdrawal due to substance abuse  Met:Yes   Target date:at discharge   As evidenced by: Patient will produce a CIWA/COWS score of 0, have stable vitals signs, and no symptoms of withdrawal.  11/28: Pt reports no signs of withdrawal with CIWA/COWS of 0 and stable vitals.   Attendees: Patient:   10/07/2015 10:16 AM   Family:   10/07/2015 10:16 AM   Physician:  Dr. Carlton Adam, MD 10/07/2015 10:16 AM   Nursing:   Trinna Post RN; Lissa Merlin RN 10/07/2015 10:16 AM   Clinical Social Worker: Maxie Better, LCSW 10/07/2015 10:16 AM   Clinical Social Worker: Erasmo Downer Drinkard LCSWA; Peri Maris LCSWA 10/07/2015 10:16 AM   Other:  Agustina Caroli NP 10/07/2015 10:16 AM   Other:  10/07/2015 10:16 AM   Other:  10/07/2015 10:16 AM   Other:  10/07/2015 10:16 AM   Other:  10/07/2015 10:16 AM   Other:  10/07/2015 10:16 AM    10/07/2015 10:16 AM    10/07/2015 10:16 AM    10/07/2015 10:16 AM    10/07/2015 10:16 AM    Scribe for Treatment Team:   Maxie Better, LCSW 10/07/2015  10:16 AM

## 2015-10-07 NOTE — Progress Notes (Signed)
Patient ID: Johnathan Baker, male   DOB: [birth year], 44 y.o.   MRN: [id number]  D: Patient was in bed until late tonight when he asked for sleep medication. Reports depressed mood and trouble sleeping. Patient appears anxious about upcoming discharge possibly tomorrow. Did ask if he could get some sample meds of thyroid medications. I asked if he was going to another rehab place and stated no. I did not question him further about that and told him to ask them about the medication tomorrow because they have gotten strict about sample medication. No active SI A: Staff will monitor on q 15 minute checks, follow treatment plan, and give meds as ordered. R: Cooperative on the unit.

## 2015-10-07 NOTE — Progress Notes (Signed)
Patient ID: Johnathan Baker, male   DOB: August 02, 1971, 44 y.o.   MRN: [id number]  Pt. Denies SI/HI and A/V hallucinations. Belongings returned to patient at time of discharge. Discharge instructions and medications were reviewed with patient. Patient verbalized understanding of both medications and discharge instructions. Patient was given bus pass, information for [employer] clinic, and [facility]. No distress noted upon discharge. Q15 minute safety checks maintained until discharge.

## 2015-10-07 NOTE — Progress Notes (Signed)
  [facility] Adult Case Management Discharge Plan :  Will you be returning to the same living situation after discharge:  No.Pt plans to live with his parents at d/c.  At discharge, do you have transportation home?: Yes,  bus pass-pt will meet his parents at d/c.  Do you have the ability to pay for your medications: Yes,  mental health  Release of information consent forms completed and submitted to medical records by CSW.  Patient to Follow up at: Follow-up Information    Follow up with [facility].   Why:  Walk in between 8am-9am Monday through Friday for hospital follow-up/medication management/assessment for mental health services.    Contact information:   [address] [place], [place] [address] Phone: [phone] Fax: [phone]      Next level of care provider has access to [facility] Link:no  Patient denies SI/HI: Yes,  during group/self report.    Safety Planning and Suicide Prevention discussed: Yes,  SPE completed with pt, as he refused to allow family contact. SPI pamphlet provided to pt with Mobile Crisis information and he was encouraged to ask questions, talk about any concerns, and share information with support network.   Have you used any form of tobacco in the last 30 days? (Cigarettes, Smokeless Tobacco, Cigars, and/or Pipes): Yes  Has patient been referred to the Quitline?: Patient refused referral  Smart, Nira Conn LCSW 10/07/2015, 10:13 AM

## 2015-11-07 ENCOUNTER — Emergency Department (HOSPITAL_COMMUNITY)
Admission: EM | Admit: 2015-11-07 | Discharge: 2015-11-08 | Disposition: A | Discharge status: Other Acute Inpatient Hospital | Payer: Self-pay | Attending: Emergency Medicine | Admitting: Emergency Medicine

## 2015-11-07 ENCOUNTER — Encounter (HOSPITAL_COMMUNITY): Payer: Self-pay | Admitting: Emergency Medicine

## 2015-11-07 DIAGNOSIS — I10 Essential (primary) hypertension: Secondary | ICD-10-CM | POA: Insufficient documentation

## 2015-11-07 DIAGNOSIS — F17210 Nicotine dependence, cigarettes, uncomplicated: Secondary | ICD-10-CM | POA: Insufficient documentation

## 2015-11-07 DIAGNOSIS — Z789 Other specified health status: Secondary | ICD-10-CM

## 2015-11-07 DIAGNOSIS — R45851 Suicidal ideations: Secondary | ICD-10-CM

## 2015-11-07 DIAGNOSIS — G8929 Other chronic pain: Secondary | ICD-10-CM | POA: Insufficient documentation

## 2015-11-07 DIAGNOSIS — Z7289 Other problems related to lifestyle: Secondary | ICD-10-CM

## 2015-11-07 DIAGNOSIS — Z79899 Other long term (current) drug therapy: Secondary | ICD-10-CM | POA: Insufficient documentation

## 2015-11-07 DIAGNOSIS — F1490 Cocaine use, unspecified, uncomplicated: Secondary | ICD-10-CM

## 2015-11-07 DIAGNOSIS — F1210 Cannabis abuse, uncomplicated: Secondary | ICD-10-CM | POA: Insufficient documentation

## 2015-11-07 DIAGNOSIS — F1410 Cocaine abuse, uncomplicated: Secondary | ICD-10-CM | POA: Insufficient documentation

## 2015-11-07 DIAGNOSIS — F1099 Alcohol use, unspecified with unspecified alcohol-induced disorder: Secondary | ICD-10-CM | POA: Insufficient documentation

## 2015-11-07 DIAGNOSIS — E079 Disorder of thyroid, unspecified: Secondary | ICD-10-CM | POA: Insufficient documentation

## 2015-11-07 LAB — RAPID URINE DRUG SCREEN, HOSP PERFORMED
AMPHETAMINES: NOT DETECTED
BENZODIAZEPINES: NOT DETECTED
Barbiturates: NOT DETECTED
COCAINE: POSITIVE — AB
OPIATES: NOT DETECTED
Tetrahydrocannabinol: POSITIVE — AB

## 2015-11-07 LAB — COMPREHENSIVE METABOLIC PANEL
ALK PHOS: 63 U/L (ref 38–126)
ALT: 21 U/L (ref 17–63)
ANION GAP: 9 (ref 5–15)
AST: 32 U/L (ref 15–41)
Albumin: 4 g/dL (ref 3.5–5.0)
BUN: 10 mg/dL (ref 6–20)
CALCIUM: 9.6 mg/dL (ref 8.9–10.3)
CHLORIDE: 105 mmol/L (ref 101–111)
CO2: 26 mmol/L (ref 22–32)
Creatinine, Ser: 1.3 mg/dL — ABNORMAL HIGH (ref 0.61–1.24)
GFR calc Af Amer: >60 mL/min (ref >60)
GFR calc non Af Amer: >60 mL/min (ref >60)
GLUCOSE: 77 mg/dL (ref 65–99)
Potassium: 4.5 mmol/L (ref 3.5–5.1)
SODIUM: 140 mmol/L (ref 135–145)
Total Bilirubin: 0.6 mg/dL (ref 0.3–1.2)
Total Protein: 6.6 g/dL (ref 6.5–8.1)

## 2015-11-07 LAB — CBC WITH DIFFERENTIAL/PLATELET
Basophils Absolute: 0 10*3/uL (ref 0.0–0.1)
Basophils Relative: 0 %
EOS ABS: 0.1 10*3/uL (ref 0.0–0.7)
Eosinophils Relative: 2 %
HEMATOCRIT: 36.6 % — AB (ref 39.0–52.0)
HEMOGLOBIN: 12 g/dL — AB (ref 13.0–17.0)
LYMPHS ABS: 3 10*3/uL (ref 0.7–4.0)
LYMPHS PCT: 49 %
MCH: 34.4 pg — AB (ref 26.0–34.0)
MCHC: 32.8 g/dL (ref 30.0–36.0)
MCV: 104.9 fL — AB (ref 78.0–100.0)
MONOS PCT: 5 %
Monocytes Absolute: 0.3 10*3/uL (ref 0.1–1.0)
NEUTROS PCT: 44 %
Neutro Abs: 2.6 10*3/uL (ref 1.7–7.7)
PLATELETS: 242 10*3/uL (ref 150–400)
RBC: 3.49 MIL/uL — AB (ref 4.22–5.81)
RDW: 12.4 % (ref 11.5–15.5)
WBC: 5.9 10*3/uL (ref 4.0–10.5)

## 2015-11-07 LAB — ACETAMINOPHEN LEVEL

## 2015-11-07 LAB — SALICYLATE LEVEL: Salicylate Lvl: <4 mg/dL (ref 2.8–30.0)

## 2015-11-07 LAB — ETHANOL: Alcohol, Ethyl (B): <5 mg/dL (ref <5)

## 2015-11-07 MED ORDER — LORAZEPAM 1 MG PO TABS: 1.0000 mg | ORAL_TABLET | Freq: Three times a day (TID) | ORAL | Status: DC | PRN

## 2015-11-07 MED ORDER — LORAZEPAM 1 MG PO TABS: 0.0000 mg | ORAL_TABLET | Freq: Two times a day (BID) | ORAL | Status: DC

## 2015-11-07 MED ORDER — ZOLPIDEM TARTRATE 5 MG PO TABS: 5.0000 mg | ORAL_TABLET | Freq: Every evening | ORAL | Status: DC | PRN

## 2015-11-07 MED ORDER — HYDROXYZINE HCL 25 MG PO TABS: 25.0000 mg | ORAL_TABLET | Freq: Four times a day (QID) | ORAL | Status: DC | PRN

## 2015-11-07 MED ORDER — ONDANSETRON HCL 4 MG PO TABS: 4.0000 mg | ORAL_TABLET | Freq: Three times a day (TID) | ORAL | Status: DC | PRN

## 2015-11-07 MED ORDER — ALUM & MAG HYDROXIDE-SIMETH 200-200-20 MG/5ML PO SUSP: 30.0000 mL | ORAL | Status: DC | PRN

## 2015-11-07 MED ORDER — ACETAMINOPHEN 325 MG PO TABS
650.0000 mg | ORAL_TABLET | ORAL | Status: DC | PRN
  Administered 2015-11-07: 650 mg via ORAL
  Filled 2015-11-07: qty 2

## 2015-11-07 MED ORDER — IBUPROFEN 400 MG PO TABS
600.0000 mg | ORAL_TABLET | Freq: Three times a day (TID) | ORAL | Status: DC | PRN
  Administered 2015-11-08: 600 mg via ORAL
  Filled 2015-11-07: qty 1

## 2015-11-07 MED ORDER — LEVOTHYROXINE SODIUM 200 MCG PO TABS
200.0000 ug | ORAL_TABLET | Freq: Every day | ORAL | Status: DC
Start: 2015-11-08 — End: 2015-11-08
  Administered 2015-11-08: 200 ug via ORAL
  Filled 2015-11-07 (×2): qty 1

## 2015-11-07 MED ORDER — TRAZODONE HCL 50 MG PO TABS
50.0000 mg | ORAL_TABLET | Freq: Every evening | ORAL | Status: DC | PRN
  Administered 2015-11-07: 50 mg via ORAL
  Filled 2015-11-07: qty 1

## 2015-11-07 MED ORDER — LORAZEPAM 1 MG PO TABS: 0.0000 mg | ORAL_TABLET | Freq: Four times a day (QID) | ORAL | Status: DC

## 2015-11-07 MED ORDER — NICOTINE 21 MG/24HR TD PT24
21.0000 mg | MEDICATED_PATCH | Freq: Every day | TRANSDERMAL | Status: DC
  Administered 2015-11-07 – 2015-11-08 (×2): 21 mg via TRANSDERMAL
  Filled 2015-11-07 (×2): qty 1

## 2015-11-07 NOTE — ED Notes (Signed)
States takes his meds regularly but did not take them today.

## 2015-11-07 NOTE — ED Notes (Addendum)
Per Waunita Schooner, [facility] - pt meets inpt criteria - advised pt stated he was SI w/plan to OD on pills from a friend he has hidden if d/c'd. Also advised pt has current legal charges. Pt is homeless. Staffing Office aware of need for sitter.

## 2015-11-07 NOTE — ED Notes (Signed)
Pt given snack. 

## 2015-11-07 NOTE — ED Notes (Signed)
STATES DOES NOT DRINK ETOH OR USE DRUGS ON DAILY BASIS - STATES BINGES W/SPOUSE. DENIES HX D/T'S FROM ETOH WITHDRAWAL.

## 2015-11-07 NOTE — BH Assessment (Addendum)
Tele Assessment Note   Johnathan Baker is an 44 y.o. male presenting this date at [facility] with SI stating, "I have had enough and ready to end it." Patient reported using over 3 grams of cocaine/crack on 11/06/15 stating at that time he felt he was going to die from excessive use. Patient reports he was at [facility] inpatient being admitted on 10/03/15-10/07/15 and "was let go to soon." Patient was suppose to follow up with outpatient services at [facility] but failed to do so. Since then patient has become homeless, has acquired legal charges and has been charged with possession of a stolen vehicle. Patient reports continued use and states his cocaine use has increased since he was last released and has also reported using more alcohol reporting daily use of 3 to 4 40 oz. beers with last use being 11/06/15. Patient states he is currently suicidal and has a plan to overdose on pills he found if released. Patient reports continued depression rating his depression at a 10 at the time of this assessment. Patient also states he has not been taking any medications since his last admission and would like to resume his medication regimen upon admission. Case was staffed with Rosana Hoes NP who stated patient met criteria for an inpatient admission as appropriate bed placement is being investigated. Disposition was given to [place] at [facility].     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 Thyroid disease   . Back pain   . Depression   . Hypertension     History reviewed. No pertinent past surgical history.  Family History: No family history on file.  Social History:  reports that he has been smoking Cigarettes.  He has been smoking  about 0.50 packs per day. He does not have any smokeless tobacco history on file. He reports that he drinks alcohol. He reports that he uses illicit drugs (Cocaine and Marijuana).  Additional Social History:  Alcohol / Drug Use Pain Medications: See MAR Prescriptions: See MAR Over the Counter: See MAR History of alcohol / drug use?: Yes Longest period of sobriety (when/how long): 2 years Withdrawal Symptoms: Agitation, Sweats, Weakness Substance #1 Name of Substance 1: Alcohol  1 - Age of First Use: 14 1 - Amount (size/oz): pt. states he uses alcohol up to 3 or 4 8 oz. beers daily  1 - Frequency: daily or pt. states at least 4 or 5 times a week 1 - Duration: last two years 1 - Last Use / Amount: 11/06/15 Substance #2 Name of Substance 2: Cocaine Crack 2 - Age of First Use: 29 2 - Amount (size/oz): 3 grams 2 - Frequency: 3 to 4 times a week, daily if possible  2 - Duration: last two years 2 - Last Use / Amount: 11/06/15  CIWA: CIWA-Ar BP: 135/96 mmHg Pulse Rate: 74 COWS:    PATIENT STRENGTHS: (  choose at least two) Average or above average intelligence Motivation for treatment/growth  Allergies: No Known Allergies  Home Medications:  (Not in a hospital admission)  OB/GYN Status:  No LMP for male patient.  General Assessment Data Location of Assessment: [facility] ED TTS Assessment: In system Is this a Tele or Face-to-Face Assessment?: Tele Assessment Is this an Initial Assessment or a Re-assessment for this encounter?: Initial Assessment Marital status: Married [place] name: na Is patient pregnant?: No Pregnancy Status: No Living Arrangements: Other (Comment) (Homeless) Can pt return to current living arrangement?: Yes Admission Status: Voluntary Is patient capable of signing voluntary admission?: Yes Referral Source: Self/Family/Friend Insurance type: none  Medical Screening Exam ([place]) Medical Exam completed: Yes  Crisis Care Plan Living Arrangements:  Other (Comment) (Homeless) Legal Guardian: Other: (na) Name of Psychiatrist: Warden/ranger Name of Therapist: none  Education Status Is patient currently in school?: No Current Grade: na Highest grade of school patient has completed: 12th Name of school: na Contact person: na  Risk to self with the past 6 months Suicidal Ideation: Yes-Currently Present Has patient been a risk to self within the past 6 months prior to admission? : Yes Suicidal Intent: Yes-Currently Present Has patient had any suicidal intent within the past 6 months prior to admission? : Yes Is patient at risk for suicide?: Yes Suicidal Plan?: Yes-Currently Present Has patient had any suicidal plan within the past 6 months prior to admission? : Yes Specify Current Suicidal Plan: patient states he has drugs to overdose Access to Means: Yes Specify Access to Suicidal Means: Patient states he has drugs to overdose with What has been your use of drugs/alcohol within the last 12 months?: Daily or 3 to 5 times weekly Previous Attempts/Gestures: Yes How many times?: 1 Other Self Harm Risks: none Triggers for Past Attempts: None known Intentional Self Injurious Behavior: None Family Suicide History: No Recent stressful life event(s): Other (Comment) (loss of housing) Persecutory voices/beliefs?: No Depression: Yes Depression Symptoms: Despondent, Fatigue, Guilt Substance abuse history and/or treatment for substance abuse?: Yes Suicide prevention information given to non-admitted patients: Not applicable  Risk to Others within the past 6 months Homicidal Ideation: No Does patient have any lifetime risk of violence toward others beyond the six months prior to admission? : Yes (comment) (Patient has tried to harm himself before) Thoughts of Harm to Others: No Comment - Thoughts of Harm to Others: na Current Homicidal Intent: No Current Homicidal Plan: No Access to Homicidal Means: No Identified Victim: na History of harm  to others?: Yes Assessment of Violence: None Noted Violent Behavior Description: Got into fights over drugs Does patient have access to weapons?: No Criminal Charges Pending?: Yes Describe Pending Criminal Charges: Possesion of stolen vehicle Does patient have a court date: Yes Court Date: 11/20/15 Is patient on probation?: No  Psychosis Hallucinations: None noted Delusions: None noted  Mental Status Report Appearance/Hygiene: Unremarkable Eye Contact: Fair Motor Activity: Unremarkable Speech: Unremarkable Level of Consciousness: Alert Mood: Depressed Affect: Depressed Anxiety Level: Minimal Thought Processes: Coherent, Relevant Judgement: Unimpaired Orientation: Person, Place, Time Obsessive Compulsive Thoughts/Behaviors: None  Cognitive Functioning Concentration: Normal Memory: Recent Intact, Remote Intact IQ: Average Insight: Fair Impulse Control: Poor Appetite: Fair Weight Loss: 0 Weight Gain: 0 Sleep: No Change Total Hours of Sleep: 8 Vegetative Symptoms: None  ADLScreening [facility] Assessment Services) Patient's cognitive ability adequate to safely complete daily activities?: Yes Patient able to express need for assistance with ADLs?: Yes Independently performs ADLs?: Yes (appropriate for developmental age)  Prior Inpatient Therapy Prior Inpatient Therapy: Yes Prior Therapy Dates: 2016 Prior Therapy Facilty/Provider(s): [facility] Reason for Treatment: SA use and SI  Prior Outpatient Therapy Prior Outpatient Therapy: Yes Prior Therapy Dates: 2016 Prior Therapy Facilty/Provider(s): [facility] Reason for Treatment: medication management Does patient have an ACCT team?: No Does patient have Intensive In-House Services?  : No Does patient have [facility] services? : Yes Does patient have P4CC services?: No  ADL Screening (condition at time of admission) Patient's cognitive ability adequate to safely complete daily activities?: Yes Is the patient deaf or have  difficulty hearing?: No Does the patient have difficulty seeing, even when wearing glasses/contacts?: No Does the patient have difficulty concentrating, remembering, or making decisions?: No Patient able to express need for assistance with ADLs?: Yes Independently performs ADLs?: Yes (appropriate for developmental age) Does the patient have difficulty walking or climbing stairs?: No Weakness of Legs: None Weakness of Arms/Hands: None  Home Assistive Devices/Equipment Home Assistive Devices/Equipment: None  Therapy Consults (therapy consults require a physician order) PT Evaluation Needed: No OT Evalulation Needed: No SLP Evaluation Needed: No Abuse/Neglect Assessment (Assessment to be complete while patient is alone) Physical Abuse: Denies Verbal Abuse: Denies Sexual Abuse: Denies Exploitation of patient/patient's resources: Denies Self-Neglect: Denies Values / Beliefs Cultural Requests During Hospitalization: None Spiritual Requests During Hospitalization: None Consults Spiritual Care Consult Needed: No Social Work Consult Needed: No Regulatory affairs officer (For Healthcare) Does patient have an advance directive?: No Would patient like information on creating an advanced directive?: No - patient declined information    Additional Information 1:1 In Past 12 Months?: Yes CIRT Risk: No Elopement Risk: No Does patient have medical clearance?: Yes     Disposition: Case was staffed with Rosana Hoes NP who stated patient met criteria for an inpatient admission as appropriate bed placement is being investigated. Disposition was given to [place] at [facility].   Disposition Initial Assessment Completed for this Encounter: Yes Disposition of Patient: Inpatient treatment program Type of inpatient treatment program: Adult Other disposition(s): Other (Comment) (will seek appropriate placement)  Mamie Nick 11/07/2015 4:53 PM

## 2015-11-07 NOTE — ED Provider Notes (Signed)
CSN: [id number]     Arrival date & time 11/07/15  1250 History   First MD Initiated Contact with Patient 11/07/15 1543     Chief Complaint  Patient presents with  . Depression  . Alcohol Problem  . Addiction Problem     (Consider location/radiation/quality/duration/timing/severity/associated sxs/prior Treatment) HPI Comments: Johnathan Baker is a 44 y.o. male with a PMHx of hypothyroidism, HTN, depression, and chronic back pain, who presents to the ED with complaints of suicidal ideations stating that he attempted to commit suicide last night by overdosing using 2 g of crack cocaine and drinking two 40 ounce beers. Patient states he feels depressed and feels that he was discharged to early from behavioral health when he was sent home last month. He has not yet followed up with [facility] and wellness which she was told to do so after his discharge. He no longer takes any of his psychiatric medications because he has run out. He denies any HI/AVH. Positive smoker. No medical complaints at this time. +prior psych admissions, +hx of mental illness, +prior suicide attempts  Patient is a 44 y.o. male presenting with alcohol problem and mental health disorder. The history is provided by the patient and medical records. No language interpreter was used.  Alcohol Problem Pertinent negatives include no abdominal pain, arthralgias, chest pain, chills, fever, myalgias, nausea, numbness, vomiting or weakness.  Mental Health Problem Presenting symptoms: depression, self mutilation, suicidal thoughts and suicide attempt   Presenting symptoms: no hallucinations and no homicidal ideas   Onset quality:  Unable to specify Timing:  Constant Progression:  Unchanged Chronicity:  Recurrent Context: alcohol use and drug abuse   Treatment compliance:  Untreated Relieved by:  None tried Worsened by:  Alcohol and drugs Ineffective treatments:  None tried Associated symptoms: no abdominal pain and no chest pain    Risk factors: hx of mental illness, hx of suicide attempts and recent psychiatric admission     Past Medical History  Diagnosis Date  . Thyroid disease   . Back pain   . Depression   . Hypertension    History reviewed. No pertinent past surgical history. No family history on file. Social History  Substance Use Topics  . Smoking status: Current Every Day Smoker -- 0.50 packs/day    Types: Cigarettes  . Smokeless tobacco: None  . Alcohol Use: Yes     Comment: occasionally    Review of Systems  Constitutional: Negative for fever and chills.  Respiratory: Negative for shortness of breath.   Cardiovascular: Negative for chest pain.  Gastrointestinal: Negative for nausea, vomiting, abdominal pain, diarrhea and constipation.  Genitourinary: Negative for dysuria and hematuria.  Musculoskeletal: Negative for myalgias and arthralgias.  Skin: Negative for color change.  Allergic/Immunologic: Negative for immunocompromised state.  Neurological: Negative for weakness and numbness.  Psychiatric/Behavioral: Positive for depression, suicidal ideas and self-injury. Negative for homicidal ideas, hallucinations and confusion.   10 Systems reviewed and are negative for acute change except as noted in the HPI.    Allergies  Review of patient's allergies indicates no known allergies.  Home Medications   Prior to Admission medications   Medication Sig Start Date End Date Taking? Authorizing Provider  hydrOXYzine (ATARAX/VISTARIL) 25 MG tablet Take 1 tablet (25 mg total) by mouth every 6 (six) hours as needed for anxiety. 10/07/15   Encarnacion Slates, NP  levothyroxine (SYNTHROID, LEVOTHROID) 200 MCG tablet Take 1 tablet (200 mcg total) by mouth daily before breakfast. For low functioning thyroid gland  10/07/15   Encarnacion Slates, NP  nicotine polacrilex (NICORETTE) 2 MG gum Take 1 each (2 mg total) by mouth as needed for smoking cessation. 10/07/15   Encarnacion Slates, NP  traZODone (DESYREL) 50 MG  tablet Take 1 tablet (50 mg total) by mouth at bedtime as needed for sleep. 10/07/15   Encarnacion Slates, NP   BP 135/96 mmHg  Pulse 74  Temp(Src) 98.2 F (36.8 C) (Oral)  Resp 18  Ht 6\' 5"  (1.956 m)  Wt 97.07 kg  BMI 25.37 kg/m2  SpO2 97% Physical Exam  Constitutional: He is oriented to person, place, and time. Vital signs are normal. He appears well-developed and well-nourished.  Non-toxic appearance. No distress.  Afebrile, nontoxic, NAD  HENT:  Head: Normocephalic and atraumatic.  Mouth/Throat: Oropharynx is clear and moist and mucous membranes are normal.  Eyes: Conjunctivae and EOM are normal. Right eye exhibits no discharge. Left eye exhibits no discharge.  Neck: Normal range of motion. Neck supple.  Cardiovascular: Normal rate, regular rhythm, normal heart sounds and intact distal pulses.  Exam reveals no gallop and no friction rub.   No murmur heard. Pulmonary/Chest: Effort normal and breath sounds normal. No respiratory distress. He has no decreased breath sounds. He has no wheezes. He has no rhonchi. He has no rales.  Abdominal: Soft. Normal appearance and bowel sounds are normal. He exhibits no distension. There is no tenderness. There is no rigidity, no rebound, no guarding, no CVA tenderness, no tenderness at McBurney's point and negative Murphy's sign.  Musculoskeletal: Normal range of motion.  Neurological: He is alert and oriented to person, place, and time. He has normal strength. No sensory deficit.  Skin: Skin is warm, dry and intact. No rash noted.  Psychiatric: He is not actively hallucinating. He exhibits a depressed mood. He expresses suicidal ideation. He expresses no homicidal ideation. He expresses suicidal plans. He expresses no homicidal plans.  Depressed affect endorsing SI with attempt last night to OD. No HI/AVH  Nursing note and vitals reviewed.   ED Course  Procedures (including critical care time) Labs Review Labs Reviewed  COMPREHENSIVE METABOLIC  PANEL - Abnormal; Notable for the following:    Creatinine, Ser 1.30 (*)    All other components within normal limits  CBC WITH DIFFERENTIAL/PLATELET - Abnormal; Notable for the following:    RBC 3.49 (*)    Hemoglobin 12.0 (*)    HCT 36.6 (*)    MCV 104.9 (*)    MCH 34.4 (*)    All other components within normal limits  URINE RAPID DRUG SCREEN, HOSP PERFORMED - Abnormal; Notable for the following:    Cocaine POSITIVE (*)    Tetrahydrocannabinol POSITIVE (*)    All other components within normal limits  ACETAMINOPHEN LEVEL - Abnormal; Notable for the following:    Acetaminophen (Tylenol), Serum <10 (*)    All other components within normal limits  ETHANOL  SALICYLATE LEVEL    Imaging Review No results found. I have personally reviewed and evaluated these images and lab results as part of my medical decision-making.   EKG Interpretation None      MDM   Final diagnoses:  Suicidal ideations  Cocaine use  Alcohol use ([place])    44 y.o. male here with SI with an attempt to OD last night using crack and alcohol. No HI/AVH. Will get clearance labs and TTS consultation, will reassess after labs. Home orders and psych hold orders placed.  4:57 PM UDS with +cocaine/THC.  CMP with stable elevation of Cr at 1.3. EtOH neg. CBC with chronic anemia. Salicylate and tylenol neg. Pt medically cleared. Please see [name] notes for further documentation of care.  BP 135/96 mmHg  Pulse 74  Temp(Src) 98.2 F (36.8 C) (Oral)  Resp 18  Ht 6\' 5"  (1.956 m)  Wt 97.07 kg  BMI 25.37 kg/m2  SpO2 97%  Meds ordered this encounter  Medications  . alum & mag hydroxide-simeth (MAALOX/MYLANTA) 200-200-20 MG/5ML suspension 30 mL    Sig:   . ondansetron (ZOFRAN) tablet 4 mg    Sig:   . nicotine (NICODERM CQ - dosed in mg/24 hours) patch 21 mg    Sig:   . DISCONTD: zolpidem (AMBIEN) tablet 5 mg    Sig:   . ibuprofen (ADVIL,MOTRIN) tablet 600 mg    Sig:   . acetaminophen (TYLENOL) tablet 650 mg     Sig:   . LORazepam (ATIVAN) tablet 1 mg    Sig:   . hydrOXYzine (ATARAX/VISTARIL) tablet 25 mg    Sig:   . levothyroxine (SYNTHROID, LEVOTHROID) tablet 200 mcg    Sig:   . traZODone (DESYREL) tablet 50 mg    Sig:      Johnathan Marsico Camprubi-Soms, PA-C 11/07/15 1659  Varney Biles, MD 11/08/15 1514

## 2015-11-07 NOTE — ED Notes (Signed)
Voiced understanding of [facility] policies.

## 2015-11-07 NOTE — ED Notes (Signed)
Patient states was in behavioral health in November and "I think I got let go too early".  Patient states still depressed, but denies SI and HI.   Patient states he has never quit using alcohol and drugs.   Patient states has been using crack, marijuana, alcohol.   Patient states last time used was yesterday.

## 2015-11-07 NOTE — ED Notes (Signed)
TTS being performed.  

## 2015-11-07 NOTE — ED Notes (Signed)
Patient states he needs detox.  Patient states he was supposed to follow up with [facility], but didn't and now needs to get cleared to go.

## 2015-11-08 ENCOUNTER — Encounter (HOSPITAL_COMMUNITY): Payer: Self-pay | Admitting: *Deleted

## 2015-11-08 ENCOUNTER — Inpatient Hospital Stay (HOSPITAL_COMMUNITY)
Admission: AD | Admit: 2015-11-08 | Discharge: 2015-11-12 | DRG: 897 | Disposition: A | Discharge status: Home / Self Care | Payer: BLUE CROSS/BLUE SHIELD | Source: Intra-hospital | Attending: Psychiatry | Admitting: Psychiatry

## 2015-11-08 DIAGNOSIS — F1994 Other psychoactive substance use, unspecified with psychoactive substance-induced mood disorder: Secondary | ICD-10-CM | POA: Diagnosis present

## 2015-11-08 DIAGNOSIS — F339 Major depressive disorder, recurrent, unspecified: Secondary | ICD-10-CM | POA: Diagnosis present

## 2015-11-08 DIAGNOSIS — G4700 Insomnia, unspecified: Secondary | ICD-10-CM | POA: Diagnosis present

## 2015-11-08 DIAGNOSIS — F1424 Cocaine dependence with cocaine-induced mood disorder: Secondary | ICD-10-CM | POA: Diagnosis present

## 2015-11-08 DIAGNOSIS — F17210 Nicotine dependence, cigarettes, uncomplicated: Secondary | ICD-10-CM | POA: Diagnosis present

## 2015-11-08 DIAGNOSIS — R45851 Suicidal ideations: Secondary | ICD-10-CM | POA: Diagnosis present

## 2015-11-08 DIAGNOSIS — F10239 Alcohol dependence with withdrawal, unspecified: Secondary | ICD-10-CM | POA: Diagnosis present

## 2015-11-08 DIAGNOSIS — E039 Hypothyroidism, unspecified: Secondary | ICD-10-CM | POA: Diagnosis present

## 2015-11-08 DIAGNOSIS — M25519 Pain in unspecified shoulder: Secondary | ICD-10-CM | POA: Diagnosis present

## 2015-11-08 DIAGNOSIS — I10 Essential (primary) hypertension: Secondary | ICD-10-CM | POA: Diagnosis present

## 2015-11-08 MED ORDER — ACETAMINOPHEN 325 MG PO TABS
650.0000 mg | ORAL_TABLET | Freq: Four times a day (QID) | ORAL | Status: DC | PRN
  Administered 2015-11-08 – 2015-11-09 (×2): 650 mg via ORAL
  Filled 2015-11-08: qty 2

## 2015-11-08 MED ORDER — NICOTINE POLACRILEX 2 MG MT GUM: 2.0000 mg | CHEWING_GUM | OROMUCOSAL | Status: DC | PRN

## 2015-11-08 MED ORDER — TRAZODONE HCL 50 MG PO TABS
50.0000 mg | ORAL_TABLET | Freq: Every evening | ORAL | Status: DC | PRN
  Administered 2015-11-08: 50 mg via ORAL
  Filled 2015-11-08: qty 1

## 2015-11-08 MED ORDER — HYDROXYZINE HCL 25 MG PO TABS
25.0000 mg | ORAL_TABLET | Freq: Four times a day (QID) | ORAL | Status: DC | PRN
  Administered 2015-11-08 – 2015-11-10 (×6): 25 mg via ORAL
  Filled 2015-11-08: qty 1
  Filled 2015-11-08: qty 12
  Filled 2015-11-08 (×5): qty 1

## 2015-11-08 MED ORDER — LEVOTHYROXINE SODIUM 100 MCG PO TABS
200.0000 ug | ORAL_TABLET | Freq: Every day | ORAL | Status: DC
  Administered 2015-11-09 – 2015-11-10 (×2): 200 ug via ORAL
  Filled 2015-11-08 (×2): qty 2
  Filled 2015-11-08: qty 1
  Filled 2015-11-08 (×2): qty 2

## 2015-11-08 MED ORDER — MAGNESIUM HYDROXIDE 400 MG/5ML PO SUSP: 30.0000 mL | Freq: Every day | ORAL | Status: DC | PRN

## 2015-11-08 MED ORDER — ALUM & MAG HYDROXIDE-SIMETH 200-200-20 MG/5ML PO SUSP: 30.0000 mL | ORAL | Status: DC | PRN

## 2015-11-08 NOTE — Progress Notes (Signed)
Patient ID: Johnathan Baker, male   DOB: 1971/02/27, 45 y.o.   MRN: [id number]   45 year old black male admitted after he presented to [facility] requesting detox from alcohol and crack/cociane. Pt reported that he was recently here at [facility], and suppose to go to treatment but did not follow up. Pt reported that he went home to his wife to try to give his marriage another chance, pt reported that he realized that his marriage was not going to work. Pt reported that he started back using drugs and realized that his life was out of control. Pt reported that he is now homeless and has been living anywhere. At time of discharge pt reported being negative SI/HI, no AH/VH noted.

## 2015-11-08 NOTE — ED Notes (Signed)
Pt in agreement w/tx plan - accepted to [facility] - 306-2 - Dr Sabra Heck. Pt signed consent forms - copy faxed to [facility] and copy sent to Medical Records.

## 2015-11-08 NOTE — ED Notes (Signed)
Attempted to call report Maudie Mercury, RN, to call back when available for report.

## 2015-11-08 NOTE — ED Notes (Signed)
Pt given Ibuprofen and ice pack for c/o left upper back pain as requested.

## 2015-11-08 NOTE — Progress Notes (Signed)
Patient accepted to [facility], bed 306-2. Bed will be ready at 12:30 p.m. Clayborne Dana RN

## 2015-11-08 NOTE — ED Notes (Signed)
Per Frankey Poot, [facility] - pt accepted - may arrive around 1230.

## 2015-11-08 NOTE — ED Notes (Signed)
Dr Ashok Cordia in w/pt.

## 2015-11-08 NOTE — ED Notes (Signed)
Patient was given a snack and drink, A regular diet ordered for lunch. 

## 2015-11-08 NOTE — Tx Team (Signed)
Initial Interdisciplinary Treatment Plan   PATIENT STRESSORS: Marital or family conflict Medication change or noncompliance   PATIENT STRENGTHS: Ability for insight General fund of knowledge   PROBLEM LIST: Problem List/Patient Goals Date to be addressed Date deferred Reason deferred Estimated date of resolution  "I need to finish what I started" 11/08/15           Substance abuse 11/08/15                                          DISCHARGE CRITERIA:  Improved stabilization in mood, thinking, and/or behavior Need for constant or close observation no longer present  PRELIMINARY DISCHARGE PLAN: Placement in alternative living arrangements  PATIENT/FAMIILY INVOLVEMENT: This treatment plan has been presented to and reviewed with the patient, Kimani Titone, and/or family member.  The patient and family have been given the opportunity to ask questions and make suggestions.  Benancio Deeds Shanta 11/08/2015, 2:13 PM

## 2015-11-08 NOTE — ED Provider Notes (Signed)
[facility] team indicates pt accepted to [facility], bed ready, Dr Sabra Heck.   Filed Vitals:   11/08/15 1131 11/08/15 1258  BP: 121/83 137/87  Pulse: 59 60  Temp:  98.1 F (36.7 C)  Resp:  18   Pt alert, content, no c/o, agreeable w plan.  Pt currently appears stable for transfer.     Lajean Saver, MD 11/08/15 1300

## 2015-11-08 NOTE — ED Notes (Signed)
Pt aware will be transported to [name] soon.

## 2015-11-08 NOTE — Discharge Instructions (Signed)
Transfer to [facility] 

## 2015-11-09 ENCOUNTER — Encounter (HOSPITAL_COMMUNITY): Payer: Self-pay | Admitting: Psychiatry

## 2015-11-09 DIAGNOSIS — F1994 Other psychoactive substance use, unspecified with psychoactive substance-induced mood disorder: Secondary | ICD-10-CM

## 2015-11-09 DIAGNOSIS — F1424 Cocaine dependence with cocaine-induced mood disorder: Principal | ICD-10-CM

## 2015-11-09 MED ORDER — GABAPENTIN 300 MG PO CAPS
300.0000 mg | ORAL_CAPSULE | Freq: Three times a day (TID) | ORAL | Status: DC
  Administered 2015-11-09 – 2015-11-12 (×10): 300 mg via ORAL
  Filled 2015-11-09 (×13): qty 1

## 2015-11-09 MED ORDER — TRAZODONE HCL 100 MG PO TABS
100.0000 mg | ORAL_TABLET | Freq: Every evening | ORAL | Status: DC | PRN
  Administered 2015-11-09 – 2015-11-11 (×3): 100 mg via ORAL
  Filled 2015-11-09: qty 1
  Filled 2015-11-09: qty 7
  Filled 2015-11-09 (×2): qty 1

## 2015-11-09 MED ORDER — CYCLOBENZAPRINE HCL 10 MG PO TABS
10.0000 mg | ORAL_TABLET | Freq: Three times a day (TID) | ORAL | Status: DC
  Administered 2015-11-09 – 2015-11-12 (×10): 10 mg via ORAL
  Filled 2015-11-09 (×13): qty 1

## 2015-11-09 NOTE — Tx Team (Addendum)
Interdisciplinary Treatment Plan Update (Adult) Date: 11/09/2015    Time Reviewed: 9:30 AM  Progress in Treatment: Attending groups: Yes Participating in groups: Minimally Taking medication as prescribed: Yes Tolerating medication: Yes Family/Significant other contact made: No, patient declines collateral contact Patient understands diagnosis: Yes Discussing patient identified problems/goals with staff: Yes Medical problems stabilized or resolved: Yes Denies suicidal/homicidal ideation: Yes Issues/concerns per patient self-inventory: Yes Other:  New problem(s) identified: N/A  Discharge Plan or Barriers: Patient plans to discharge to the [facility] to follow up with outpatient services.   Reason for Continuation of Hospitalization:  Depression Anxiety Medication Stabilization   Comments: N/A  Estimated length of stay: Discharge anticipated for 11/12/15   Patient is a 45 year old male admitted for SI with plan to overdose on pills. He is currently homeless in [place]. He is detoxing off of alcohol and cocaine. Last admission to [facility] in November 2016. Patient will benefit from crisis stabilization, medication evaluation, group therapy, and psycho education in addition to case management for discharge planning. Patient and CSW reviewed pt's identified goals and treatment plan. Pt verbalized understanding and agreed to treatment plan.     Review of initial/current patient goals per problem list:  1. Goal(s): Patient will participate in aftercare plan   Met: Yes   Target date: 3-5 days post admission date   As evidenced by: Patient will participate within aftercare plan AEB aftercare provider and housing plan at discharge being identified.  1/2: Goal not met: CSW assessing for appropriate referrals for pt and will have follow up secured prior to d/c.  11/12/15: Goal met. Patient plans to discharge to the [facility] [place] to follow up with outpatient  services.    2. Goal (s): Patient will exhibit decreased depressive symptoms and suicidal ideations.   Met: Adequate for discharge per MD   Target date: 3-5 days post admission date   As evidenced by: Patient will utilize self rating of depression at 3 or below and demonstrate decreased signs of depression or be deemed stable for discharge by MD.  1/2: Goal not met: Pt presents with flat affect and depressed mood.  Pt admitted with depression rating of 10.  Pt to show decreased sign of depression and a rating of 3 or less before d/c.    1/5: Adequate for discharge. Patient reports baseline levels of depression and denies SI.   3. Goal(s): Patient will demonstrate decreased signs and symptoms of anxiety.   Met: Adequate for discharge per MD   Target date: 3-5 days post admission date   As evidenced by: Patient will utilize self rating of anxiety at 3 or below and demonstrated decreased signs of anxiety, or be deemed stable for discharge by MD   1/2: Goal not met: Pt presents with anxious mood and affect.  Pt admitted with anxiety rating of 10.  Pt to show decreased sign of anxiety and a rating of 3 or less before d/c.  1/5: Adequate for discharge per MD. Patient reports baseline levels of anxiety, reports feeling safe for discharge.     4. Goal(s): Patient will demonstrate decreased signs of withdrawal due to substance abuse   Met: Yes   Target date: 3-5 days post admission date   As evidenced by: Patient will produce a CIWA/COWS score of 0, have stable vitals signs, and no symptoms of withdrawal    1/2: Goal met. No withdrawal symptoms reported at this time per medical chart.      Attendees: Patient:  Family:    Physician: Dr. Shea Evans, Dr. Sabra Heck  11/09/2015 9:30 AM  Nursing: Jerene Bears, [facility], [place], RN 11/09/2015 9:30 AM  Clinical Social Worker: Tilden Fossa,  [place] 11/09/2015 9:30 AM  Other: Peri Maris, LCSW; [facility], LCSW  11/09/2015  9:30 AM   11/09/2015 9:30 AM  Other: Lars Pinks, Case Manager 11/09/2015 9:30 AM  Other:  Agustina Caroli, NP 11/09/2015 9:30 AM  Other:      Scribe for Treatment Team:  Tilden Fossa, MSW, [place] [phone]

## 2015-11-09 NOTE — BHH Group Notes (Signed)
[facility] LCSW Aftercare Discharge Planning Group Note  11/09/2015  8:45 AM  Participation Quality: Did Not Attend. Patient invited to participate but declined.  Tilden Fossa, MSW, [place] Worker [facility] [phone]

## 2015-11-09 NOTE — Progress Notes (Signed)
  D: Patient withdrawn and resting in bed for the entire shift. Pt states he wants to relax in bed because he feels anxious. Pt declined to go to group session. Pt is, however pleasant and brightens on approach.  A: Q 15 minute safety checks, encourage staff/peer interaction, group participation, administer medications as ordered. R: Patient compliant with HS medications and denies SI/HI or plans to harm himself at this time. No s/s of distress noted on assessment.
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
## 2015-11-09 NOTE — Progress Notes (Signed)
Recreation Therapy Notes  Date: 01.02.2017 Time: 9:30am Location: 300 Group Room   Group Topic: Stress Management  Goal Area(s) Addresses:  Patient will actively participate in stress management techniques presented during session.   Behavioral Response: Did not attend.   Laureen Ochs March Joos, LRT/CTRS        Taejah Ohalloran L 11/09/2015 10:18 AM

## 2015-11-09 NOTE — BHH Suicide Risk Assessment (Signed)
[facility] Admission Suicide Risk Assessment   Nursing information obtained from:  Patient Demographic factors:  Male Current Mental Status:  NA Loss Factors:  NA Historical Factors:  NA Risk Reduction Factors:  NA Total Time spent with patient: 45 minutes Principal Problem: Cocaine dependence with cocaine-induced mood disorder ([place]) Diagnosis:   Patient Active Problem List   Diagnosis Date Noted  . Major depressive disorder, recurrent episode ([place]) [F33.9] 11/08/2015    Priority: High  . Cocaine dependence with cocaine-induced mood disorder [facility]) [F14.24] 10/03/2015    Priority: High  . Hypothyroidism [E03.9] 10/03/2015    Priority: High  . Alcohol dependence with uncomplicated withdrawal ([place]) [F10.230]   . Thyroid activity decreased [E03.9]   . Substance induced mood disorder ([place]) [F19.94] 10/03/2015  . Alcohol dependence ([place]) [F10.20] 10/03/2015     Continued Clinical Symptoms:  Alcohol Use Disorder Identification Test Final Score (AUDIT): 8 The "Alcohol Use Disorders Identification Test", Guidelines for Use in Primary Care, Second Edition.  World Pharmacologist [facility]). Score between 0-7:  no or low risk or alcohol related problems. Score between 8-15:  moderate risk of alcohol related problems. Score between 16-19:  high risk of alcohol related problems. Score 20 or above:  warrants further diagnostic evaluation for alcohol dependence and treatment.   CLINICAL FACTORS:   Alcohol/Substance Abuse/Dependencies    Psychiatric Specialty Exam: Physical Exam  ROS  Blood pressure 127/83, pulse 57, temperature 97.6 F (36.4 C), temperature source Oral, resp. rate 16, height 6\' 5"  (1.956 m), weight 94.802 kg (209 lb).Body mass index is 24.78 kg/(m^2).   COGNITIVE FEATURES THAT CONTRIBUTE TO RISK:  Closed-mindedness, Polarized thinking and Thought constriction (tunnel vision)    SUICIDE RISK:   Moderate:  Frequent suicidal ideation with limited intensity, and duration,  some specificity in terms of plans, no associated intent, good self-control, limited dysphoria/symptomatology, some risk factors present, and identifiable protective factors, including available and accessible social support.  PLAN OF CARE: see admission H and P  Medical Decision Making:  Review of Psycho-Social Stressors (1), Review or order clinical lab tests (1), Review of Medication Regimen & Side Effects (2) and Review of New Medication or Change in Dosage (2)  I certify that inpatient services furnished can reasonably be expected to improve the patient's condition.   Yago Ludvigsen A 11/09/2015, 6:33 PM
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
## 2015-11-09 NOTE — BHH Group Notes (Signed)
[facility] LCSW Group Therapy 11/09/2015  1:15 PM   Type of Therapy: Group Therapy  Participation Level: Did Not Attend. Patient invited to participate but declined.   Tilden Fossa, MSW, [place] Worker [facility] [phone]

## 2015-11-10 LAB — TSH: TSH: 32.521 u[IU]/mL — AB (ref 0.350–4.500)

## 2015-11-10 MED ORDER — LEVOTHYROXINE SODIUM 100 MCG PO TABS
100.0000 ug | ORAL_TABLET | Freq: Every day | ORAL | Status: DC
  Administered 2015-11-11 – 2015-11-12 (×2): 100 ug via ORAL
  Filled 2015-11-10 (×3): qty 1

## 2015-11-10 MED ORDER — QUETIAPINE FUMARATE 100 MG PO TABS
100.0000 mg | ORAL_TABLET | Freq: Every day | ORAL | Status: DC
  Administered 2015-11-10 – 2015-11-11 (×2): 100 mg via ORAL
  Filled 2015-11-10 (×3): qty 1

## 2015-11-10 NOTE — Progress Notes (Signed)
Recreation Therapy Notes  Animal-Assisted Activity (AAA) Program Checklist/Progress Notes Patient Eligibility Criteria Checklist & Daily Group note for Rec Tx Intervention  Date: 01.03.2017 Time: 2:45pm Location: [address]    AAA/T Program Assumption of Risk Form signed by Patient/ or Parent Legal Guardian yes  Patient is free of allergies or sever asthma yes  Patient reports no fear of animals yes  Patient reports no history of cruelty to animals yes  Patient understands his/her participation is voluntary yes  Behavioral Response: Did not attend.  Laureen Ochs Reisha Wos, LRT/CTRS  Laquincy Eastridge L 11/10/2015 2:03 PM

## 2015-11-10 NOTE — BHH Group Notes (Signed)
[place] Group Notes:  (Nursing/MHT/Case Management/Adjunct)  Date:  11/10/2015  Time:  1:47 PM  Type of Therapy:  Psychoeducational Skills  Participation Level:  Active  Participation Quality:  Appropriate  Affect:  Appropriate  Cognitive:  Appropriate  Insight:  Appropriate  Engagement in Group:  Engaged  Modes of Intervention:  Problem-solving  Summary of Progress/Problems: Summary of Progress/Problems: Topic was on leisure and lifestyle changes. Discussed the important of choosing healthy leisure activities. Group encouraged to surround themselves with positive and healthy group/support system when changing to a healthy life style.   Wolfgang Phoenix 11/10/2015, 1:47 PM

## 2015-11-10 NOTE — Progress Notes (Signed)
[facility] MD Progress Note  11/10/2015 7:28 PM Johnathan Baker  MRN:  [id number] Subjective:  [place] endorses he is having some mood instability, would like to be considered for a mood stabilizer. States that the mood changes are not only when he uses the cocaine. He is still firm in that he is not going to communicate with his wife. States he can anticipate what is going to happen if he was to call ( her convincing him to get back together get a room in a motel and then she will leave and bring cocaine with her and entice him to use and he will be back in the same predicament. States he needs to go to a residential treatment program Principal Problem: Cocaine dependence with cocaine-induced mood disorder ([place]) Diagnosis:   Patient Active Problem List   Diagnosis Date Noted  . Major depressive disorder, recurrent episode ([place]) [F33.9] 11/08/2015    Priority: High  . Cocaine dependence with cocaine-induced mood disorder [facility]) [F14.24] 10/03/2015    Priority: High  . Hypothyroidism [E03.9] 10/03/2015    Priority: High  . Alcohol dependence with uncomplicated withdrawal ([place]) [F10.230]   . Thyroid activity decreased [E03.9]   . Substance induced mood disorder ([place]) [F19.94] 10/03/2015  . Alcohol dependence ([place]) [F10.20] 10/03/2015   Total Time spent with patient: 30 minutes  Past Psychiatric History: see admission H and P  Past Medical History:  Past Medical History  Diagnosis Date  . Thyroid disease   . Back pain   . Depression   . Hypertension    History reviewed. No pertinent past surgical history. Family History: History reviewed. No pertinent family history. Family Psychiatric  History: see admission H and P Social History:  History  Alcohol Use  . Yes    Comment: binge drinks     History  Drug Use  . 0.50 per week  . Special: Cocaine, Marijuana    Comment: last used yesterday    Social History   Social History  . Marital Status: Single    Spouse Name: N/A  . Number of  Children: N/A  . Years of Education: N/A   Social History Main Topics  . Smoking status: Current Every Day Smoker -- 0.50 packs/day    Types: Cigarettes  . Smokeless tobacco: None  . Alcohol Use: Yes     Comment: binge drinks  . Drug Use: 0.50 per week    Special: Cocaine, Marijuana     Comment: last used yesterday  . Sexual Activity: Not Asked   Other Topics Concern  . None   Social History Narrative   Additional Social History:    Pain Medications: See MAR Prescriptions: See MAR Over the Counter: See MAR History of alcohol / drug use?: Yes Longest period of sobriety (when/how long): 2 years Negative Consequences of Use: Personal relationships Withdrawal Symptoms: Agitation, Weakness Name of Substance 1: Alcohol  1 - Age of First Use: 14 1 - Amount (size/oz): pt. states he uses alcohol up to 3 or 4 8 oz. beers daily  1 - Frequency: daily or pt. states at least 4 or 5 times a week 1 - Duration: last two years 1 - Last Use / Amount: 11/06/15 Name of Substance 2: Cocaine Crack 2 - Age of First Use: 29 2 - Amount (size/oz): 3 grams 2 - Frequency: 3 to 4 times a week, daily if possible  2 - Duration: last two years 2 - Last Use / Amount: 11/06/15  Sleep: Fair  Appetite:  Fair  Current Medications: Current Facility-Administered Medications  Medication Dose Route Frequency Provider Last Rate Last Dose  . acetaminophen (TYLENOL) tablet 650 mg  650 mg Oral Q6H PRN Encarnacion Slates, NP   650 mg at 11/09/15 1406  . alum & mag hydroxide-simeth (MAALOX/MYLANTA) 200-200-20 MG/5ML suspension 30 mL  30 mL Oral Q4H PRN Encarnacion Slates, NP      . cyclobenzaprine (FLEXERIL) tablet 10 mg  10 mg Oral TID Nicholaus Bloom, MD   10 mg at 11/10/15 1703  . gabapentin (NEURONTIN) capsule 300 mg  300 mg Oral TID Nicholaus Bloom, MD   300 mg at 11/10/15 1703  . hydrOXYzine (ATARAX/VISTARIL) tablet 25 mg  25 mg Oral Q6H PRN Encarnacion Slates, NP   25 mg at 11/10/15 1617  .  levothyroxine (SYNTHROID, LEVOTHROID) tablet 200 mcg  200 mcg Oral QAC breakfast Encarnacion Slates, NP   200 mcg at 11/10/15 [id number]  . magnesium hydroxide (MILK OF MAGNESIA) suspension 30 mL  30 mL Oral Daily PRN Encarnacion Slates, NP      . nicotine polacrilex (NICORETTE) gum 2 mg  2 mg Oral PRN Encarnacion Slates, NP      . QUEtiapine (SEROQUEL) tablet 100 mg  100 mg Oral QHS Nicholaus Bloom, MD      . traZODone (DESYREL) tablet 100 mg  100 mg Oral QHS PRN Nicholaus Bloom, MD   100 mg at 11/09/15 2146    Lab Results:  Results for orders placed or performed during the hospital encounter of 11/08/15 (from the past 48 hour(s))  TSH     Status: Abnormal   Collection Time: 11/10/15  6:50 AM  Result Value Ref Range   TSH 32.521 (H) 0.350 - 4.500 uIU/mL    Comment: Performed at [facility]    Physical Findings: AIMS: Facial and Oral Movements Muscles of Facial Expression: None, normal Lips and Perioral Area: None, normal Jaw: None, normal Tongue: None, normal,Extremity Movements Upper (arms, wrists, hands, fingers): None, normal Lower (legs, knees, ankles, toes): None, normal, Trunk Movements Neck, shoulders, hips: None, normal, Overall Severity Severity of abnormal movements (highest score from questions above): None, normal Incapacitation due to abnormal movements: None, normal Patient's awareness of abnormal movements (rate only patient's report): No Awareness, Dental Status Current problems with teeth and/or dentures?: Yes Does patient usually wear dentures?: No  CIWA:  CIWA-Ar Total: 0 COWS:  COWS Total Score: 0  Musculoskeletal: Strength & Muscle Tone: within normal limits Gait & Station: normal Patient leans: normal  Psychiatric Specialty Exam: Review of Systems  Constitutional: Negative.   HENT: Negative.   Eyes: Negative.   Respiratory: Negative.   Cardiovascular: Negative.   Gastrointestinal: Negative.   Genitourinary: Negative.   Musculoskeletal: Negative.    Skin: Negative.   Neurological: Negative.   Endo/Heme/Allergies: Negative.   Psychiatric/Behavioral: Positive for depression and substance abuse. The patient is nervous/anxious.     Blood pressure 123/79, pulse 62, temperature 98.5 F (36.9 C), temperature source Oral, resp. rate 18, height 6\' 5"  (1.956 m), weight 94.802 kg (209 lb).Body mass index is 24.78 kg/(m^2).  General Appearance: Fairly Groomed  Engineer, water::  Fair  Speech:  Clear and Coherent  Volume:  fluctuates  Mood:  Anxious and Depressed  Affect:  Restricted  Thought Process:  Coherent and Goal Directed  Orientation:  Full (Time, Place, and Person)  Thought Content:  symptoms events worries concerns  Suicidal Thoughts:  No  Homicidal Thoughts:  No  Memory:  Immediate;   Fair Recent;   Fair Remote;   Fair  Judgement:  Fair  Insight:  Present and Shallow  Psychomotor Activity:  Restlessness  Concentration:  Fair  Recall:  [employer] of Knowledge:Fair  Language: Fair  Akathisia:  No  Handed:  Right  AIMS (if indicated):     Assets:  Desire for Improvement  ADL's:  Intact  Cognition: WNL  Sleep:  Number of Hours: 7   Treatment Plan Summary: Daily contact with patient to assess and evaluate symptoms and progress in treatment and Medication management Supportive approach/coping skills Cocaine dependence; work a relapse prevention plan Mood instability; will start a trial with Seroquel 100 mg HS and then consider if we need to add a smaller amount during the day Explore residential treatment options Use CBT/mindfulness Ia Leeb A 11/10/2015, 7:28 PM

## 2015-11-10 NOTE — Progress Notes (Signed)
D: Johnathan Baker was asleep at the start of shift. He missed group this evening. Rates Anxiety 10/10 and Depression 5/10. He is minimally interactive this evening. States he gets agitated easily and tries to avoid being around some of his peers in the dayroom. Encouraged [name] to continue implementing his coping skills and avoid stressors and triggers to his anxiety.  A:Q 15 minute checks for patient safety. Encouragement and support given. Medications administered as prescribed. R: Continue to monitor for patient safety and medication effectiveness.
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
## 2015-11-11 NOTE — Progress Notes (Signed)
D: D[place] states today was good. His goal was to work on not talking so much and also to get answers on his potential placement. He rates Anxiety 2/10 Depression and Hopelessness 3-4/10. When I questioned why he had been so somber and quiet today he states "No need to keep talking about the same problems that no one wants to hear about".  A: Encouragement and support given. Medications administered as prescribed. Q15 minute checks for patient safety.  R: Continue to monitor for patient safety and medication effectiveness.

## 2015-11-11 NOTE — BHH Suicide Risk Assessment (Signed)
Ali Molina INPATIENT:  Family/Significant Other Suicide Prevention Education  Suicide Prevention Education:  Patient Refusal for Family/Significant Other Suicide Prevention Education: The patient Johnathan Baker has refused to provide written consent for family/significant other to be provided Family/Significant Other Suicide Prevention Education during admission and/or prior to discharge.  Physician notified. Declined SPE.  Fayth Trefry, Casimiro Needle 11/11/2015, 8:42 AM

## 2015-11-11 NOTE — Progress Notes (Addendum)
[facility] MD Progress Note  11/11/2015 5:32 PM Even [place]  MRN:  [id number] Subjective:  Johnathan Baker continues to have Baker hard time. He communicated with his wife yesterday. He states she is saying the same things she has said before that they can work things out, to come back. He states she is the one that keeps leaving without telling him what she is going to do and brings cocaine. He admits that he is going to have Baker hard time staying clean if he continues to be in Baker relationship with her. He states that he wants to get his life back together. 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 States he was on track to do great things and all went down the drain. States he loves his wife and that he would do anything for her but he cant continue to be with her  Principal Problem: Cocaine dependence with cocaine-induced mood disorder ([place]) Diagnosis:   Patient Active Problem List   Diagnosis Date Noted  . Major depressive disorder, recurrent episode ([place]) [F33.9] 11/08/2015    Priority: High  . Cocaine dependence with cocaine-induced mood disorder [facility]) [F14.24] 10/03/2015    Priority: High  . Hypothyroidism [E03.9] 10/03/2015    Priority: High  . Alcohol dependence with uncomplicated withdrawal ([place]) [F10.230]   . Thyroid activity decreased [E03.9]   . Substance induced mood disorder ([place]) [F19.94] 10/03/2015  . Alcohol dependence ([place]) [F10.20] 10/03/2015   Total Time spent with patient: 30 minutes  Past Psychiatric History: See admission H and P  Past Medical History:  Past Medical History  Diagnosis Date  . Thyroid disease   . Back pain   . Depression   . Hypertension    History reviewed. No pertinent past surgical history. Family History: History reviewed. No pertinent family history. Family Psychiatric  History: see admission H and P Social  History:  History  Alcohol Use  . Yes    Comment: binge drinks     History  Drug Use  . 0.50 per week  . Special: Cocaine, Marijuana    Comment: last used yesterday    Social History   Social History  . Marital Status: Single    Spouse Name: N/Baker  . Number of Children: N/Baker  . Years of Education: N/Baker   Social History Main Topics  . Smoking status: Current Every Day Smoker -- 0.50 packs/day    Types: Cigarettes  . Smokeless tobacco: None  . Alcohol Use: Yes     Comment: binge drinks  . Drug Use: 0.50 per week    Special: Cocaine, Marijuana     Comment: last used yesterday  . Sexual Activity: Not Asked   Other Topics Concern  . None   Social History Narrative   Additional Social History:    Pain Medications: See MAR Prescriptions: See MAR Over the Counter: See MAR History of alcohol / drug use?: Yes Longest period of sobriety (when/how long): 2 years Negative Consequences of Use: Personal relationships Withdrawal Symptoms: Agitation, Weakness Name of Substance 1: Alcohol  1 - Age of First Use: 14 1 - Amount (size/oz): pt. states he uses alcohol up to 3 or 4 8 oz. beers daily  1 - Frequency: daily or pt. states at least 4 or 5 times Baker week 1 - Duration: last two years 1 -  Last Use / Amount: 11/06/15 Name of Substance 2: Cocaine Crack 2 - Age of First Use: 29 2 - Amount (size/oz): 3 grams 2 - Frequency: 3 to 4 times Baker week, daily if possible  2 - Duration: last two years 2 - Last Use / Amount: 11/06/15                Sleep: Fair  Appetite:  Fair  Current Medications: Current Facility-Administered Medications  Medication Dose Route Frequency Provider Last Rate Last Dose  . acetaminophen (TYLENOL) tablet 650 mg  650 mg Oral Q6H PRN Encarnacion Slates, NP   650 mg at 11/09/15 1406  . alum & mag hydroxide-simeth (MAALOX/MYLANTA) 200-200-20 MG/5ML suspension 30 mL  30 mL Oral Q4H PRN Encarnacion Slates, NP      . cyclobenzaprine (FLEXERIL) tablet 10 mg  10 mg Oral  TID Nicholaus Bloom, MD   10 mg at 11/11/15 1658  . gabapentin (NEURONTIN) capsule 300 mg  300 mg Oral TID Nicholaus Bloom, MD   300 mg at 11/11/15 1658  . hydrOXYzine (ATARAX/VISTARIL) tablet 25 mg  25 mg Oral Q6H PRN Encarnacion Slates, NP   25 mg at 11/10/15 1617  . levothyroxine (SYNTHROID, LEVOTHROID) tablet 100 mcg  100 mcg Oral QAC breakfast Nicholaus Bloom, MD   100 mcg at 11/11/15 [phone]  . magnesium hydroxide (MILK OF MAGNESIA) suspension 30 mL  30 mL Oral Daily PRN Encarnacion Slates, NP      . nicotine polacrilex (NICORETTE) gum 2 mg  2 mg Oral PRN Encarnacion Slates, NP      . QUEtiapine (SEROQUEL) tablet 100 mg  100 mg Oral QHS Nicholaus Bloom, MD   100 mg at 11/10/15 2143  . traZODone (DESYREL) tablet 100 mg  100 mg Oral QHS PRN Nicholaus Bloom, MD   100 mg at 11/10/15 2143    Lab Results:  Results for orders placed or performed during the hospital encounter of 11/08/15 (from the past 48 hour(s))  TSH     Status: Abnormal   Collection Time: 11/10/15  6:50 AM  Result Value Ref Range   TSH 32.521 (H) 0.350 - 4.500 uIU/mL    Comment: Performed at [facility]    Physical Findings: AIMS: Facial and Oral Movements Muscles of Facial Expression: None, normal Lips and Perioral Area: None, normal Jaw: None, normal Tongue: None, normal,Extremity Movements Upper (arms, wrists, hands, fingers): None, normal Lower (legs, knees, ankles, toes): None, normal, Trunk Movements Neck, shoulders, hips: None, normal, Overall Severity Severity of abnormal movements (highest score from questions above): None, normal Incapacitation due to abnormal movements: None, normal Patient's awareness of abnormal movements (rate only patient's report): No Awareness, Dental Status Current problems with teeth and/or dentures?: Yes Does patient usually wear dentures?: No  CIWA:  CIWA-Ar Total: 0 COWS:  COWS Total Score: 0  Musculoskeletal: Strength & Muscle Tone: within normal limits Gait & Station:  normal Patient leans: normal  Psychiatric Specialty Exam: Review of Systems  Constitutional: Negative.   HENT: Negative.   Eyes: Negative.   Respiratory: Negative.   Cardiovascular: Negative.   Gastrointestinal: Negative.   Genitourinary: Negative.   Musculoskeletal: Negative.   Skin: Negative.   Neurological: Negative.   Endo/Heme/Allergies: Negative.   Psychiatric/Behavioral: Positive for substance abuse. The patient is nervous/anxious.     Blood pressure 127/74, pulse 59, temperature 98.5 F (36.9 C), temperature source Oral, resp. rate 17, height 6\' 5"  (1.956 m), weight  94.802 kg (209 lb).Body mass index is 24.78 kg/(m^2).  General Appearance: Fairly Groomed  Engineer, water::  Fair  Speech:  Clear and Coherent  Volume:  fluctuates  Mood:  Anxious and Dysphoric  Affect:  Restricted  Thought Process:  Coherent and Goal Directed  Orientation:  Full (Time, Place, and Person)  Thought Content:  symptoms events worries concerns  Suicidal Thoughts:  No  Homicidal Thoughts:  No  Memory:  Immediate;   Fair Recent;   Fair Remote;   Fair  Judgement:  Fair  Insight:  Present and Shallow  Psychomotor Activity:  Restlessness  Concentration:  Fair  Recall:  [employer] of Knowledge:Fair  Language: Fair  Akathisia:  No  Handed:  Right  AIMS (if indicated):     Assets:  Desire for Improvement  ADL's:  Intact  Cognition: WNL  Sleep:  Number of Hours: 7   Treatment Plan Summary: Daily contact with patient to assess and evaluate symptoms and progress in treatment and Medication management Supportive approach/coping skills Cocaine dependence; continue to work Baker relapse prevention plan Mood instability; continue to work with the Seroquel 100 mg HS Insomnia; continue to work with the Trazodone Pain; continue the Neurontin/Flexeril combination Hypothyroidism; explain that the dose was reduced as he has not been taking the synthroid consistent ly for what the dose has to be increased  gradually to avoid side effects Explore residential treatment options Work with CBT/mindfulness Johnathan Baker 11/11/2015, 5:32 PM

## 2015-11-11 NOTE — Progress Notes (Signed)
Recreation Therapy Notes  Date: 01.04.2017 Time: 9:30am Location: 300 [address] Group Room   Group Topic: Stress Management  Goal Area(s) Addresses:  Patient will actively participate in stress management techniques presented during session.   Behavioral Response: Appropriate   Intervention: Stress management techniques  Activity :  Deep Breathing and Guided Imagery. LRT provided instruction and demonstration on practice of Guided Imagery. Technique was coupled with deep breathing.   Education:  Stress Management, Discharge Planning.   Education Outcome: Acknowledges education  Clinical Observations/Feedback: Patient actively engaged in technique introduced, expressed no concerns and demonstrated ability to practice independently post d/c.   Laureen Ochs Fredericka Bottcher, LRT/CTRS        Lane Hacker 11/11/2015 12:54 PM

## 2015-11-11 NOTE — Progress Notes (Signed)
DAR NOTE: Patient presents with anxious affect and depressed mood.  Denies pain, auditory and visual hallucinations.  Rates depression at 6, hopelessness at 7, and anxiety at 7.  Maintained on routine safety checks.  Medications given as prescribed.  Support and encouragement offered as needed.  Attended group and participated.  States goal for today is " try to interact with poeple."  Patient observed socializing with peers in the dayroom.  Offered no complaint.

## 2015-11-11 NOTE — Plan of Care (Signed)
Problem: Alteration in mood & ability to function due to Goal: LTG-Pt verbalizes understanding of importance of med regimen (Patient verbalizes understanding of importance of medication regimen and need to continue outpatient care and support groups)  Outcome: Progressing Pt continues to  enquire about his medications. Pt stated this morning that he is able to tell which medication he is taking and the treatment.

## 2015-11-11 NOTE — BHH Group Notes (Signed)
[place] LCSW Group Therapy 11/11/2015  1:15 PM Type of Therapy: Group Therapy Participation Level: Active  Participation Quality: Attentive, Sharing and Supportive  Affect: Depressed and Flat  Cognitive: Alert and Oriented  Insight: Developing/Improving and Engaged  Engagement in Therapy: Developing/Improving and Engaged  Modes of Intervention: Clarification, Confrontation, Discussion, Education, Exploration, Limit-setting, Orientation, Problem-solving, Rapport Building, Art therapist, Socialization and Support  Summary of Progress/Problems: The topic for group today was emotional regulation. This group focused on both positive and negative emotion identification and allowed group members to process ways to identify feelings, regulate negative emotions, and find healthy ways to manage internal/external emotions. Group members were asked to reflect on a time when their reaction to an emotion led to a negative outcome and explored how alternative responses using emotion regulation would have benefited them. Group members were also asked to discuss a time when emotion regulation was utilized when a negative emotion was experienced. Patient expressed that he could identify with another group member's experience of having trouble trusting herself and others. CSW and other group members provided patient with emotional support and encouragement.  Tilden Fossa, MSW, [place] Worker [facility] [phone]

## 2015-11-11 NOTE — BHH Group Notes (Signed)
   [facility] LCSW Aftercare Discharge Planning Group Note  11/11/2015  8:45 AM   Participation Quality: Alert, Appropriate and Oriented  Mood/Affect: Depressed and Flat  Depression Rating: 4  Anxiety Rating: 5  Thoughts of Suicide: Pt denies SI/HI  Will you contract for safety? Yes  Current AVH: Pt denies  Plan for Discharge/Comments: Pt attended discharge planning group and actively participated in group. CSW provided pt with today's workbook. Patient reports that he slept well last night. He is interested in going to the [facility] at discharge to follow up with outpatient services.   Transportation Means: Pt reports access to transportation  Supports: No supports mentioned at this time  Tilden Fossa, MSW, [place] Social Worker [employer] [phone]

## 2015-11-12 MED ORDER — LEVOTHYROXINE SODIUM 100 MCG PO TABS: 100.0000 ug | ORAL_TABLET | Freq: Every day | ORAL | Status: DC

## 2015-11-12 MED ORDER — HYDROXYZINE HCL 25 MG PO TABS: 25.0000 mg | ORAL_TABLET | Freq: Four times a day (QID) | ORAL | Status: DC | PRN

## 2015-11-12 MED ORDER — CYCLOBENZAPRINE HCL 10 MG PO TABS: 10.0000 mg | ORAL_TABLET | Freq: Three times a day (TID) | ORAL | Status: DC

## 2015-11-12 MED ORDER — QUETIAPINE FUMARATE 100 MG PO TABS: 100.0000 mg | ORAL_TABLET | Freq: Every day | ORAL | Status: DC

## 2015-11-12 MED ORDER — NICOTINE POLACRILEX 2 MG MT GUM: 2.0000 mg | CHEWING_GUM | OROMUCOSAL | Status: DC | PRN

## 2015-11-12 MED ORDER — TRAZODONE HCL 100 MG PO TABS: 100.0000 mg | ORAL_TABLET | Freq: Every evening | ORAL | Status: DC | PRN

## 2015-11-12 MED ORDER — GABAPENTIN 300 MG PO CAPS: 300.0000 mg | ORAL_CAPSULE | Freq: Three times a day (TID) | ORAL | Status: DC

## 2015-11-12 NOTE — BHH Suicide Risk Assessment (Signed)
[facility] Discharge Suicide Risk Assessment   Demographic Factors:  Male  Total Time spent with patient: 20 minutes  Musculoskeletal: Strength & Muscle Tone: within normal limits Gait & Station: normal Patient leans: normal  Psychiatric Specialty Exam: Physical Exam  Review of Systems  Constitutional: Negative.   HENT: Negative.   Eyes: Negative.   Respiratory: Negative.   Cardiovascular: Negative.   Gastrointestinal: Negative.   Genitourinary: Negative.   Musculoskeletal: Negative.   Skin: Negative.   Neurological: Negative.   Endo/Heme/Allergies: Negative.   Psychiatric/Behavioral: Positive for depression and substance abuse.    Blood pressure 125/73, pulse 72, temperature 98.8 F (37.1 C), temperature source Oral, resp. rate 20, height 6\' 5"  (1.956 m), weight 94.802 kg (209 lb).Body mass index is 24.78 kg/(m^2).  General Appearance: Fairly Groomed  Engineer, water::  Fair  Speech:  Clear and [id number]  Volume:  Normal  Mood:  Euthymic  Affect:  Appropriate  Thought Process:  Coherent and Goal Directed  Orientation:  Full (Time, Place, and Person)  Thought Content:  Plans as he moves on/relapse prevention plan  Suicidal Thoughts:  No  Homicidal Thoughts:  No  Memory:  Immediate;   Fair Recent;   Fair Remote;   Fair  Judgement:  Fair  Insight:  Present  Psychomotor Activity:  Normal  Concentration:  Fair  Recall:  [employer] of [place]  Language: Fair  Akathisia:  No  Handed:  Right  AIMS (if indicated):     Assets:  Desire for Improvement  Sleep:  Number of Hours: 6.75  Cognition: WNL  ADL's:  Intact   Have you used any form of tobacco in the last 30 days? (Cigarettes, Smokeless Tobacco, Cigars, and/or Pipes): Yes  Has this patient used any form of tobacco in the last 30 days? (Cigarettes, Smokeless Tobacco, Cigars, and/or Pipes) Yes, A prescription for an FDA-approved tobacco cessation medication was offered at discharge and the patient refused  Mental  Status Per Nursing Assessment::   On Admission:  NA  Current Mental Status by Physician: In full contact with reality. There are no active S/S of withdrawal. There are no active SI plans or intent. He is willing and motivated to go to [facility] work their program and get his life back together. His wife was already calling him telling him she was going to get an extended stay hotel room ( what he knows will start the cycle all over again)   Loss Factors: Loss of significant relationship  Historical Factors: NA  Risk Reduction Factors:   Sense of responsibility to family  Continued Clinical Symptoms:  Depression:   Comorbid alcohol abuse/dependence Alcohol/Substance Abuse/Dependencies  Cognitive Features That Contribute To Risk:  Closed-mindedness, Polarized thinking and Thought constriction (tunnel vision)    Suicide Risk:  Minimal: No identifiable suicidal ideation.  Patients presenting with no risk factors but with morbid ruminations; may be classified as minimal risk based on the severity of the depressive symptoms  Principal Problem: Cocaine dependence with cocaine-induced mood disorder [facility] Discharge Diagnoses:  Patient Active Problem List   Diagnosis Date Noted  . Major depressive disorder, recurrent episode ([place]) [F33.9] 11/08/2015    Priority: High  . Cocaine dependence with cocaine-induced mood disorder [facility]) [F14.24] 10/03/2015    Priority: High  . Hypothyroidism [E03.9] 10/03/2015    Priority: High  . Alcohol dependence with uncomplicated withdrawal ([place]) [F10.230]   . Thyroid activity decreased [E03.9]   . Substance induced mood disorder ([place]) [F19.94] 10/03/2015  . Alcohol  dependence ([place]) [F10.20] 10/03/2015    Follow-up Information    Follow up with [facility] On 11/17/2015.   Why:  Assessment for therapy and medication management services on Tuesday January 10th at 9am. Please bring ID and letter from shelter stating that you are residing  there and have no income. Call if you need to reschedule appointment.   Contact information:   [place] [phone] Fax: [phone]      Plan Of Care/Follow-up recommendations:  Activity:  as tolerated Diet:  regular Follow up as above Is patient on multiple antipsychotic therapies at discharge:  No   Has Patient had three or more failed trials of antipsychotic monotherapy by history:  No  Recommended Plan for Multiple Antipsychotic Therapies: NA    Twylia Oka A 11/12/2015, 11:37 AM

## 2015-11-12 NOTE — Plan of Care (Signed)
Problem: Alteration in mood & ability to function due to Goal: STG-Patient will attend groups Outcome: Progressing Pt attended evening AA group     

## 2015-11-12 NOTE — Progress Notes (Signed)
  [facility] Adult Case Management Discharge Plan :  Will you be returning to the same living situation after discharge:  Yes,  Patient will be provided with prescriptions at discharge At discharge, do you have transportation home?: Yes,  patient will be provided with train ticket Do you have the ability to pay for your medications: Yes,  patient will be provided with prescriptions at discharge  Release of information consent forms completed and in the chart;  Patient's signature needed at discharge.  Patient to Follow up at: Follow-up Information    Follow up with [facility] On 11/17/2015.   Why:  Assessment for therapy and medication management services on Tuesday January 10th at 9am. Please bring ID and letter from shelter stating that you are residing there and have no income. Call if you need to reschedule appointment.   Contact information:   [place] [phone] Fax: [phone]      Next level of care provider has access to [place] and Suicide Prevention discussed: Yes,  with patient  Have you used any form of tobacco in the last 30 days? (Cigarettes, Smokeless Tobacco, Cigars, and/or Pipes): Yes  Has patient been referred to the Quitline?: Patient refused referral  Patient has been referred for addiction treatment: Yes-outpatient  Johnathan Baker, Johnathan Baker 11/12/2015, 11:22 AM

## 2015-11-12 NOTE — Progress Notes (Signed)
Patient ID: Johnathan Baker, male   DOB: [birth year], 45 y.o.   MRN: [id number] D: Patient reports his day was better but appeared withdrawn from peers. Pt presented with depressed mood and flat affect. Pt endorses passive suicidal ideation but contracts verbally for safety. Pt denies SI/HI/AVH and pain. Pt               attended evening AA group.  Cooperative with assessment.  A: Met with pt 1:1. Medications administered as prescribed. Pt encouraged pt to discuss feelings and engaged in milieu.  R: Patient remains safe and complaint with medications.
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
## 2015-11-12 NOTE — Progress Notes (Signed)
Discharge- D-Patient verbalizes readiness for discharge: Denies SI/HI/AVH and pain. A- Discharge instructions read and discussed with patient.  All belongings returned to patient. Patient provided with Medication samples and fare/ticket for transportation to [facility]. R- Patient cooperative with discharge process.  Patient verbalizes understanding of discharge instructions.  Signed for return of belongings. Escorted to the lobby and given directions to the bus stop.

## 2015-11-12 NOTE — [facility] Group Notes (Signed)
[facility] Group Notes:  (Nursing/MHT/Case Management/Adjunct)  Date:  11/12/2015  Time:  0915  Type of Therapy:  Nurse Education  Participation Level:  Active  Participation Quality:  Appropriate and Attentive  Affect:  Appropriate  Cognitive:  Alert and Appropriate  Insight:  Appropriate  Engagement in Group:  Engaged  Modes of Intervention:  Clarification and Discussion  Summary of Progress/Problems: patient was able to give a goal for the day and participated.  Maureen Chatters [facility] 11/12/2015, 10:18 AM

## 2015-11-13 ENCOUNTER — Emergency Department (HOSPITAL_COMMUNITY)
Admission: EM | Admit: 2015-11-13 | Discharge: 2015-11-13 | Disposition: A | Discharge status: Home / Self Care | Payer: Federal, State, Local not specified - Other | Attending: Emergency Medicine | Admitting: Emergency Medicine

## 2015-11-13 ENCOUNTER — Encounter (HOSPITAL_COMMUNITY): Payer: Self-pay

## 2015-11-13 DIAGNOSIS — F17210 Nicotine dependence, cigarettes, uncomplicated: Secondary | ICD-10-CM | POA: Insufficient documentation

## 2015-11-13 DIAGNOSIS — E079 Disorder of thyroid, unspecified: Secondary | ICD-10-CM | POA: Insufficient documentation

## 2015-11-13 DIAGNOSIS — F1424 Cocaine dependence with cocaine-induced mood disorder: Secondary | ICD-10-CM | POA: Insufficient documentation

## 2015-11-13 DIAGNOSIS — F329 Major depressive disorder, single episode, unspecified: Secondary | ICD-10-CM | POA: Insufficient documentation

## 2015-11-13 DIAGNOSIS — F1994 Other psychoactive substance use, unspecified with psychoactive substance-induced mood disorder: Secondary | ICD-10-CM | POA: Diagnosis present

## 2015-11-13 DIAGNOSIS — F1014 Alcohol abuse with alcohol-induced mood disorder: Secondary | ICD-10-CM | POA: Insufficient documentation

## 2015-11-13 DIAGNOSIS — I10 Essential (primary) hypertension: Secondary | ICD-10-CM | POA: Insufficient documentation

## 2015-11-13 DIAGNOSIS — F1910 Other psychoactive substance abuse, uncomplicated: Secondary | ICD-10-CM

## 2015-11-13 DIAGNOSIS — Z79899 Other long term (current) drug therapy: Secondary | ICD-10-CM | POA: Insufficient documentation

## 2015-11-13 LAB — COMPREHENSIVE METABOLIC PANEL
ALT: 23 U/L (ref 17–63)
AST: 36 U/L (ref 15–41)
Albumin: 4.5 g/dL (ref 3.5–5.0)
Alkaline Phosphatase: 64 U/L (ref 38–126)
Anion gap: 9 (ref 5–15)
BILIRUBIN TOTAL: 0.6 mg/dL (ref 0.3–1.2)
BUN: 15 mg/dL (ref 6–20)
CO2: 29 mmol/L (ref 22–32)
Calcium: 9.9 mg/dL (ref 8.9–10.3)
Chloride: 105 mmol/L (ref 101–111)
Creatinine, Ser: 1.17 mg/dL (ref 0.61–1.24)
Glucose, Bld: 101 mg/dL — ABNORMAL HIGH (ref 65–99)
POTASSIUM: 4.3 mmol/L (ref 3.5–5.1)
Sodium: 143 mmol/L (ref 135–145)
TOTAL PROTEIN: 7.5 g/dL (ref 6.5–8.1)

## 2015-11-13 LAB — RAPID URINE DRUG SCREEN, HOSP PERFORMED
Amphetamines: NOT DETECTED
BENZODIAZEPINES: NOT DETECTED
Barbiturates: NOT DETECTED
Cocaine: POSITIVE — AB
Opiates: NOT DETECTED
Tetrahydrocannabinol: NOT DETECTED

## 2015-11-13 LAB — CBC
HCT: 37.7 % — ABNORMAL LOW (ref 39.0–52.0)
Hemoglobin: 12.2 g/dL — ABNORMAL LOW (ref 13.0–17.0)
MCH: 35.2 pg — AB (ref 26.0–34.0)
MCHC: 32.4 g/dL (ref 30.0–36.0)
MCV: 108.6 fL — AB (ref 78.0–100.0)
PLATELETS: 244 10*3/uL (ref 150–400)
RBC: 3.47 MIL/uL — ABNORMAL LOW (ref 4.22–5.81)
RDW: 12.7 % (ref 11.5–15.5)
WBC: 9.8 10*3/uL (ref 4.0–10.5)

## 2015-11-13 LAB — ACETAMINOPHEN LEVEL: Acetaminophen (Tylenol), Serum: <10 ug/mL — ABNORMAL LOW (ref 10–30)

## 2015-11-13 LAB — SALICYLATE LEVEL: Salicylate Lvl: <4 mg/dL (ref 2.8–30.0)

## 2015-11-13 LAB — ETHANOL

## 2015-11-13 MED ORDER — ONDANSETRON HCL 4 MG PO TABS: 4.0000 mg | ORAL_TABLET | Freq: Three times a day (TID) | ORAL | Status: DC | PRN

## 2015-11-13 MED ORDER — IBUPROFEN 200 MG PO TABS: 600.0000 mg | ORAL_TABLET | Freq: Three times a day (TID) | ORAL | Status: DC | PRN

## 2015-11-13 NOTE — ED Notes (Signed)
Pt was discharged from [facility] today at noon and given a bus ticket to [facility] and put out on the street, pt states he's from [place] ans wants to get out of [place], but he needs more help and didn't make it to the bus station, he states he tried to OD on cocaine, pt got to ED by [employer], he states that he is suicidal but wants help.

## 2015-11-13 NOTE — ED Notes (Signed)
Patient admits to [facility] with a plan to over dose. Patient denies HI and AVH at this time. Patient states " I want help but I need someone to take me straight to rehab not drop me off with bus ticket. If they put me out I will just die". Encouragement and support provided and safety maintain. Q 15 min safety checks in place.

## 2015-11-13 NOTE — ED Notes (Signed)
Pt is irritable while talking with the treatment and blames [facility] for not driving him to the treatment facility when discharged yesterday. Pt was provided with a bus pass and placement at a treatment center. Per MD pt is ready for discharge and services are in place. Pt has samples and a bus pass.
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
## 2015-11-13 NOTE — Consult Note (Addendum)
[place] [facility] Consult   Reason for Consult:  Suicidal ideations Referring Physician:  EDP Patient Identification: Alandis Bluemel MRN:  [id number] Principal Diagnosis: Cocaine dependence with cocaine-induced mood disorder [facility]) Diagnosis:   Patient Active Problem List   Diagnosis Date Noted  . Substance induced mood disorder ([place]) [F19.94] 10/03/2015    Priority: High  . Cocaine dependence with cocaine-induced mood disorder [facility]) [F14.24] 10/03/2015    Priority: High  . Major depressive disorder, recurrent episode ([place]) [F33.9] 11/08/2015  . Alcohol dependence with uncomplicated withdrawal ([place]) [F10.230]   . Thyroid activity decreased [E03.9]   . Alcohol dependence ([place]) [F10.20] 10/03/2015  . Hypothyroidism [E03.9] 10/03/2015    Total Time spent with patient: 45 minutes  Subjective:   Coden Franchi is a 45 y.o. male patient does not warrant admission.  HPI:  45 yo male presented to the ED with suicidal ideations and cocaine abuse.  He was released from [facility] yesterday with a bus pass to get him to his drug rehab facility along with a 5 day supply of medications.  Jaan, however, went and used cocaine.  He was angry on assessment stating the hospital should have driven him to [facility] to his rehab as he cannot help but use drugs.  Damarian then started being belligerent and demanding.  He was not suicidal or homicidal, hallucinating, or withdrawing.  Stable and discharged.  Past Psychiatric History: Cocaine abuse/dependence  Risk to Self: Suicidal Ideation: Yes-Currently Present Suicidal Intent: Yes-Currently Present Is patient at risk for suicide?: Yes Suicidal Plan?: Yes-Currently Present Specify Current Suicidal Plan: Overdose Access to Means: Yes Specify Access to Suicidal Means: medications What has been your use of drugs/alcohol within the last 12 months?: Crack & marijuana How many times?: 1 Other Self Harm Risks: None Triggers for Past Attempts: Unknown Intentional  Self Injurious Behavior: None Risk to Others: Homicidal Ideation: No Thoughts of Harm to Others: No Comment - Thoughts of Harm to Others: None Current Homicidal Intent: No Current Homicidal Plan: No Access to Homicidal Means: No Identified Victim: No one History of harm to others?: No Assessment of Violence: In distant past Violent Behavior Description: has gotten into fights over drugs. Does patient have access to weapons?: Yes (Comment) (Can get things ) Criminal Charges Pending?: Yes Describe Pending Criminal Charges: Possession of stolen vehicle Does patient have a court date: Yes Court Date: 11/20/15 Prior Inpatient Therapy: Prior Inpatient Therapy: Yes Prior Therapy Dates: 2016; d/ced on 01.05 Prior Therapy Facilty/Provider(s): [facility] Reason for Treatment: SA use and SI Prior Outpatient Therapy: Prior Outpatient Therapy: Yes Prior Therapy Dates: 2016 Prior Therapy Facilty/Provider(s): [facility] Reason for Treatment: medication management Does patient have an ACCT team?: No Does patient have Intensive In-House Services?  : No Does patient have [facility] services? : No Does patient have P4CC services?: No  Past Medical History:  Past Medical History  Diagnosis Date  . Thyroid disease   . Back pain   . Depression   . Hypertension    History reviewed. No pertinent past surgical history. Family History: History reviewed. No pertinent family history. Family Psychiatric  History: None Social History:  History  Alcohol Use  . Yes    Comment: binge drinks     History  Drug Use  . 0.50 per week  . Special: Cocaine, Marijuana    Comment: last used yesterday    Social History   Social History  . Marital Status: Single    Spouse Name: N/A  . Number of Children: N/A  .  Years of Education: N/A   Social History Main Topics  . Smoking status: Current Every Day Smoker -- 0.50 packs/day    Types: Cigarettes  . Smokeless tobacco: None  . Alcohol Use: Yes     Comment: binge  drinks  . Drug Use: 0.50 per week    Special: Cocaine, Marijuana     Comment: last used yesterday  . Sexual Activity: Not Asked   Other Topics Concern  . None   Social History Narrative   Additional Social History:    Pain Medications: See [facility] d/c meds Prescriptions: See [facility] d/c meds Over the Counter: See [facility] d/c meds. History of alcohol / drug use?: Yes Name of Substance 1: Crack cocaine 1 - Age of First Use: 45 years of age 76 - Amount (size/oz): "grams" 1 - Frequency: "Every chance I get" 1 - Duration: Ongoing 1 - Last Use / Amount: 01/05 Name of Substance 2: Marijuana 2 - Age of First Use: 45 years old 2 - Amount (size/oz): Dimebag 2 - Frequency: Every chance I get 2 - Duration: Ongoign 2 - Last Use / Amount: 01/05                 Allergies:  No Known Allergies  Labs:  Results for orders placed or performed during the hospital encounter of 11/13/15 (from the past 48 hour(s))  Comprehensive metabolic panel     Status: Abnormal   Collection Time: 11/13/15  1:50 AM  Result Value Ref Range   Sodium 143 135 - 145 mmol/L   Potassium 4.3 3.5 - 5.1 mmol/L   Chloride 105 101 - 111 mmol/L   CO2 29 22 - 32 mmol/L   Glucose, Bld 101 (H) 65 - 99 mg/dL   BUN 15 6 - 20 mg/dL   Creatinine, Ser 1.17 0.61 - 1.24 mg/dL   Calcium 9.9 8.9 - 10.3 mg/dL   Total Protein 7.5 6.5 - 8.1 g/dL   Albumin 4.5 3.5 - 5.0 g/dL   AST 36 15 - 41 U/L   ALT 23 17 - 63 U/L   Alkaline Phosphatase 64 38 - 126 U/L   Total Bilirubin 0.6 0.3 - 1.2 mg/dL   GFR calc non Af Amer >60 >60 mL/min   GFR calc Af Amer >60 >60 mL/min    Comment: (NOTE) The eGFR has been calculated using the CKD EPI equation. This calculation has not been validated in all clinical situations. eGFR's persistently <60 mL/min signify possible Chronic Kidney Disease.    Anion gap 9 5 - 15  Ethanol (ETOH)     Status: None   Collection Time: 11/13/15  1:50 AM  Result Value Ref Range   Alcohol, Ethyl (B) <5 <5 mg/dL     Comment:        LOWEST DETECTABLE LIMIT FOR SERUM ALCOHOL IS 5 mg/dL FOR MEDICAL PURPOSES ONLY   Salicylate level     Status: None   Collection Time: 11/13/15  1:50 AM  Result Value Ref Range   Salicylate Lvl <0.3 2.8 - 30.0 mg/dL  Acetaminophen level     Status: Abnormal   Collection Time: 11/13/15  1:50 AM  Result Value Ref Range   Acetaminophen (Tylenol), Serum <10 (L) 10 - 30 ug/mL    Comment:        THERAPEUTIC CONCENTRATIONS VARY SIGNIFICANTLY. A RANGE OF 10-30 ug/mL MAY BE AN EFFECTIVE CONCENTRATION FOR MANY PATIENTS. HOWEVER, SOME ARE BEST TREATED AT CONCENTRATIONS OUTSIDE THIS RANGE. ACETAMINOPHEN CONCENTRATIONS >150 ug/mL AT  4 HOURS AFTER INGESTION AND >50 ug/mL AT 12 HOURS AFTER INGESTION ARE OFTEN ASSOCIATED WITH TOXIC REACTIONS.   CBC     Status: Abnormal   Collection Time: 11/13/15  1:50 AM  Result Value Ref Range   WBC 9.8 4.0 - 10.5 K/uL   RBC 3.47 (L) 4.22 - 5.81 MIL/uL   Hemoglobin 12.2 (L) 13.0 - 17.0 g/dL   HCT 37.7 (L) 39.0 - 52.0 %   MCV 108.6 (H) 78.0 - 100.0 fL   MCH 35.2 (H) 26.0 - 34.0 pg   MCHC 32.4 30.0 - 36.0 g/dL   RDW 12.7 11.5 - 15.5 %   Platelets 244 150 - 400 K/uL  Urine rapid drug screen (hosp performed) (Not at [facility])     Status: Abnormal   Collection Time: 11/13/15  4:46 AM  Result Value Ref Range   Opiates NONE DETECTED NONE DETECTED   Cocaine POSITIVE (A) NONE DETECTED   Benzodiazepines NONE DETECTED NONE DETECTED   Amphetamines NONE DETECTED NONE DETECTED   Tetrahydrocannabinol NONE DETECTED NONE DETECTED   Barbiturates NONE DETECTED NONE DETECTED    Comment:        DRUG SCREEN FOR MEDICAL PURPOSES ONLY.  IF CONFIRMATION IS NEEDED FOR ANY PURPOSE, NOTIFY LAB WITHIN 5 DAYS.        LOWEST DETECTABLE LIMITS FOR URINE DRUG SCREEN Drug Class       Cutoff (ng/mL) Amphetamine      1000 Barbiturate      200 Benzodiazepine   580 Tricyclics       998 Opiates          300 Cocaine          300 THC              50     No  current facility-administered medications for this encounter.   Current Outpatient Prescriptions  Medication Sig Dispense Refill  . cyclobenzaprine (FLEXERIL) 10 MG tablet Take 1 tablet (10 mg total) by mouth 3 (three) times daily. 30 tablet 0  . gabapentin (NEURONTIN) 300 MG capsule Take 1 capsule (300 mg total) by mouth 3 (three) times daily. 90 capsule 0  . hydrOXYzine (ATARAX/VISTARIL) 25 MG tablet Take 1 tablet (25 mg total) by mouth every 6 (six) hours as needed for anxiety. 45 tablet 0  . levothyroxine (SYNTHROID, LEVOTHROID) 100 MCG tablet Take 1 tablet (100 mcg total) by mouth daily before breakfast. 30 tablet 0  . QUEtiapine (SEROQUEL) 100 MG tablet Take 1 tablet (100 mg total) by mouth at bedtime. 30 tablet 0  . nicotine polacrilex (NICORETTE) 2 MG gum Take 1 each (2 mg total) by mouth as needed for smoking cessation. (Patient not taking: Reported on 11/13/2015) 100 tablet 0  . traZODone (DESYREL) 100 MG tablet Take 1 tablet (100 mg total) by mouth at bedtime as needed for sleep. 30 tablet 0    Musculoskeletal: Strength & Muscle Tone: within normal limits Gait & Station: normal Patient leans: N/A  Psychiatric Specialty Exam: Review of Systems  Constitutional: Negative.   HENT: Negative.   Eyes: Negative.   Respiratory: Negative.   Cardiovascular: Negative.   Gastrointestinal: Negative.   Genitourinary: Negative.   Musculoskeletal: Negative.   Skin: Negative.   Neurological: Negative.   Endo/Heme/Allergies: Negative.   Psychiatric/Behavioral: Positive for substance abuse.    Blood pressure 131/75, pulse 78, temperature 98.2 F (36.8 C), temperature source Oral, resp. rate 20, SpO2 100 %.There is no weight on file to calculate BMI.  General  Appearance: Casual  Eye Contact::  Good  Speech:  Normal Rate  Volume:  Normal  Mood:  Irritable  Affect:  Congruent  Thought Process:  Coherent  Orientation:  Full (Time, Place, and Person)  Thought Content:  WDL  Suicidal  Thoughts:  No  Homicidal Thoughts:  No  Memory:  Immediate;   Good Recent;   Good Remote;   Good  Judgement:  Fair  Insight:  Fair  Psychomotor Activity:  Normal  Concentration:  Good  Recall:  Good  Fund of Knowledge:Good  Language: Good  Akathisia:  No  Handed:  Right  AIMS (if indicated):     Assets:  Leisure Time Physical Health Resilience  ADL's:  Intact  Cognition: WNL  Sleep:      Treatment Plan Summary: Daily contact with patient to assess and evaluate symptoms and progress in treatment, Medication management and Plan cocaine induced mood disorder:  -Crisis stabilization -Medication management:  Seroquel 100 mg at bedtime for mood stabilization, Trazodone 100 mg at bedtime for sleep issues, Gabapentin 300 mg TID for anxiety and mood, Vistaril 25 mg every 8 hours PRN anxiety started -Individual and substance abuse counseling  Disposition: No evidence of imminent risk to self or others at present.    Waylan Boga, [place] 11/13/2015 3:19 PM Patient seen face-to-face for psychiatric evaluation, chart reviewed and case discussed with the physician extender and developed treatment plan. Reviewed the information documented and agree with the treatment plan. Corena Pilgrim, MD

## 2015-11-13 NOTE — ED Notes (Signed)
Pt has in belonging bag:  Black shoes, black jeans, black t-shirt, black book bag with clothes and accessories, grey hoodie sweater, blue sweater, grey shirt, blue shirt, grey sweat pants, black jeans, and blue socks

## 2015-11-13 NOTE — ED Provider Notes (Signed)
CSN: [id number]     Arrival date & time 11/13/15  0057 History  By signing my name below, I, Starleen Arms, attest that this documentation has been prepared under the direction and in the presence of Elania Crowl, MD. Electronically Signed: Starleen Arms, ED Scribe. 11/13/2015. 2:10 AM.    Chief Complaint  Patient presents with  . Suicidal   Patient is a 45 y.o. male presenting with drug/alcohol assessment. The history is provided by the patient. No language interpreter was used.  Drug / Alcohol Assessment Similar prior episodes: yes   Severity:  Moderate Onset quality:  Sudden Timing:  Constant Progression:  Unchanged Chronicity:  Recurrent Suspected agents:  Crack Associated symptoms: no abdominal pain   Risk factors: no chronic illness    HPI Comments: Johnathan Baker is a 45 y.o. male who presents to the Emergency Department complaining of continuing SI onset 12/31.  The patient was seen on 12/31 in the ED and admitted to [facility].  He was discharged today from [facility] and given a bus ticket to [facility] to check into a residential alcohol and drug program.  He did not go to the bus station and instead used 2 g of crack and had 24 oz of beer.    Past Medical History  Diagnosis Date  . Thyroid disease   . Back pain   . Depression   . Hypertension    History reviewed. No pertinent past surgical history. History reviewed. No pertinent family history. Social History  Substance Use Topics  . Smoking status: Current Every Day Smoker -- 0.50 packs/day    Types: Cigarettes  . Smokeless tobacco: None  . Alcohol Use: Yes     Comment: binge drinks    Review of Systems  Gastrointestinal: Negative for abdominal pain.  All other systems reviewed and are negative.  10 Systems reviewed and all are negative for acute change except as noted in the HPI.   Allergies  Review of patient's allergies indicates no known allergies.  Home Medications   Prior to Admission medications   Medication Sig Start  Date End Date Taking? Authorizing Provider  cyclobenzaprine (FLEXERIL) 10 MG tablet Take 1 tablet (10 mg total) by mouth 3 (three) times daily. 11/12/15  Yes Benjamine Mola, FNP  gabapentin (NEURONTIN) 300 MG capsule Take 1 capsule (300 mg total) by mouth 3 (three) times daily. 11/12/15  Yes Benjamine Mola, FNP  hydrOXYzine (ATARAX/VISTARIL) 25 MG tablet Take 1 tablet (25 mg total) by mouth every 6 (six) hours as needed for anxiety. 11/12/15  Yes Benjamine Mola, FNP  levothyroxine (SYNTHROID, LEVOTHROID) 100 MCG tablet Take 1 tablet (100 mcg total) by mouth daily before breakfast. 11/12/15  Yes Benjamine Mola, FNP  QUEtiapine (SEROQUEL) 100 MG tablet Take 1 tablet (100 mg total) by mouth at bedtime. 11/12/15  Yes Benjamine Mola, FNP  nicotine polacrilex (NICORETTE) 2 MG gum Take 1 each (2 mg total) by mouth as needed for smoking cessation. Patient not taking: Reported on 11/13/2015 11/12/15   Benjamine Mola, FNP  traZODone (DESYREL) 100 MG tablet Take 1 tablet (100 mg total) by mouth at bedtime as needed for sleep. 11/12/15   Elyse Jarvis [place], FNP   BP 121/80 mmHg  Pulse 76  Temp(Src) 97.4 F (36.3 C) (Oral)  Resp 20  SpO2 97% Physical Exam  Constitutional: He is oriented to person, place, and time. He appears well-developed and well-nourished. No distress.  HENT:  Head: Normocephalic and atraumatic.  Mouth/Throat: Oropharynx  is clear and moist. No oropharyngeal exudate.  Eyes: Conjunctivae and EOM are normal. Pupils are equal, round, and reactive to light.  Neck: Normal range of motion. Neck supple. No tracheal deviation present.  Cardiovascular: Normal rate.   RRR  Pulmonary/Chest: Effort normal. No respiratory distress.  Lungs clear.   Abdominal: Soft. Bowel sounds are normal. There is no tenderness. There is no rebound and no guarding.  Musculoskeletal: Normal range of motion.  Neurological: He is alert and oriented to person, place, and time. He has normal reflexes.  Skin: Skin is warm and dry.   Psychiatric: He expresses no suicidal plans and no homicidal plans.  Nursing note and vitals reviewed.   ED Course  Procedures (including critical care time)  DIAGNOSTIC STUDIES: Oxygen Saturation is 97% on RA, normal by my interpretation.    COORDINATION OF CARE:  2:21 AM Discussed treatment plan with patient at bedside.  Patient acknowledges and agrees with plan.    Labs Review Labs Reviewed  CBC - Abnormal; Notable for the following:    RBC 3.47 (*)    Hemoglobin 12.2 (*)    HCT 37.7 (*)    MCV 108.6 (*)    MCH 35.2 (*)    All other components within normal limits  COMPREHENSIVE METABOLIC PANEL  ETHANOL  SALICYLATE LEVEL  ACETAMINOPHEN LEVEL  URINE RAPID DRUG SCREEN, HOSP PERFORMED    Imaging Review No results found. I have personally reviewed and evaluated these images and lab results as part of my medical decision-making.   EKG Interpretation None      MDM   Final diagnoses:  None    Dispo per TTS  I personally performed the services described in this documentation, which was scribed in my presence. The recorded information has been reviewed and is accurate.      Veatrice Kells, MD 11/13/15 [phone]

## 2015-11-13 NOTE — [facility] Suicide Risk Assessment (Signed)
Suicide Risk Assessment  Discharge Assessment   [facility] Discharge Suicide Risk Assessment   Demographic Factors:  Male and Low socioeconomic status  Total Time spent with patient: 45 minutes  Musculoskeletal: Strength & Muscle Tone: within normal limits Gait & Station: normal Patient leans: N/A  Psychiatric Specialty Exam: Review of Systems  Constitutional: Negative.   HENT: Negative.   Eyes: Negative.   Respiratory: Negative.   Cardiovascular: Negative.   Gastrointestinal: Negative.   Genitourinary: Negative.   Musculoskeletal: Negative.   Skin: Negative.   Neurological: Negative.   Endo/Heme/Allergies: Negative.   Psychiatric/Behavioral: Positive for substance abuse.    Blood pressure 131/75, pulse 78, temperature 98.2 F (36.8 C), temperature source Oral, resp. rate 20, SpO2 100 %.There is no weight on file to calculate BMI.  General Appearance: Casual  Eye Contact::  Good  Speech:  Normal Rate  Volume:  Normal  Mood:  Irritable  Affect:  Congruent  Thought Process:  Coherent  Orientation:  Full (Time, Place, and Person)  Thought Content:  WDL  Suicidal Thoughts:  No  Homicidal Thoughts:  No  Memory:  Immediate;   Good Recent;   Good Remote;   Good  Judgement:  Fair  Insight:  Fair  Psychomotor Activity:  Normal  Concentration:  Good  Recall:  Good  Fund of Knowledge:Good  Language: Good  Akathisia:  No  Handed:  Right  AIMS (if indicated):     Assets:  Leisure Time Physical Health Resilience  ADL's:  Intact  Cognition: WNL  Sleep:          Has this patient used any form of tobacco in the last 30 days? (Cigarettes, Smokeless Tobacco, Cigars, and/or Pipes) Yes, A prescription for an FDA-approved tobacco cessation medication was offered at discharge and the patient refused  Mental Status Per Nursing Assessment::   On Admission:    suicidal ideations  Current Mental Status by Physician: NA  Loss Factors: NA  Historical Factors: NA  Risk  Reduction Factors:   Sense of responsibility to family  Continued Clinical Symptoms:  Irritability  Cognitive Features That Contribute To Risk:  None    Suicide Risk:  Minimal: No identifiable suicidal ideation.  Patients presenting with no risk factors but with morbid ruminations; may be classified as minimal risk based on the severity of the depressive symptoms  Principal Problem: Cocaine dependence with cocaine-induced mood disorder [facility]) Discharge Diagnoses:  Patient Active Problem List   Diagnosis Date Noted  . Substance induced mood disorder ([place]) [F19.94] 10/03/2015    Priority: High  . Cocaine dependence with cocaine-induced mood disorder [facility]) [F14.24] 10/03/2015    Priority: High  . Major depressive disorder, recurrent episode ([place]) [F33.9] 11/08/2015  . Alcohol dependence with uncomplicated withdrawal ([place]) [F10.230]   . Thyroid activity decreased [E03.9]   . Alcohol dependence ([place]) [F10.20] 10/03/2015  . Hypothyroidism [E03.9] 10/03/2015      Plan Of Care/Follow-up recommendations:  Activity:  as tolerated Diet:  heart healthy diet  Is patient on multiple antipsychotic therapies at discharge:  No   Has Patient had three or more failed trials of antipsychotic monotherapy by history:  No  Recommended Plan for Multiple Antipsychotic Therapies: NA    Johnathan Baker, PMH-NP 11/13/2015, 3:22 PM

## 2015-11-14 NOTE — [facility] Suicide Risk Assessment (Signed)
Suicide Risk Assessment  Discharge Assessment   [facility] Discharge Suicide Risk Assessment   Demographic Factors:  Male and Low socioeconomic status  Total Time spent with patient: 45 minutes  Musculoskeletal: Strength & Muscle Tone: within normal limits Gait & Station: normal Patient leans: N/A  Psychiatric Specialty Exam: Review of Systems  Constitutional: Negative.   HENT: Negative.   Eyes: Negative.   Respiratory: Negative.   Cardiovascular: Negative.   Gastrointestinal: Negative.   Genitourinary: Negative.   Musculoskeletal: Negative.   Skin: Negative.   Neurological: Negative.   Endo/Heme/Allergies: Negative.   Psychiatric/Behavioral: Positive for substance abuse.    Blood pressure 131/75, pulse 78, temperature 98.2 F (36.8 C), temperature source Oral, resp. rate 20, SpO2 100 %.There is no weight on file to calculate BMI.  General Appearance: Casual  Eye Contact::  Good  Speech:  Normal Rate  Volume:  Normal  Mood:  Irritable  Affect:  Congruent  Thought Process:  Coherent  Orientation:  Full (Time, Place, and Person)  Thought Content:  WDL  Suicidal Thoughts:  No  Homicidal Thoughts:  No  Memory:  Immediate;   Good Recent;   Good Remote;   Good  Judgement:  Fair  Insight:  Fair  Psychomotor Activity:  Normal  Concentration:  Good  Recall:  Good  Fund of Knowledge:Good  Language: Good  Akathisia:  No  Handed:  Right  AIMS (if indicated):     Assets:  Leisure Time Physical Health Resilience  ADL's:  Intact  Cognition: WNL  Sleep:         Has this patient used any form of tobacco in the last 30 days? (Cigarettes, Smokeless Tobacco, Cigars, and/or Pipes) Yes, A prescription for an FDA-approved tobacco cessation medication was offered at discharge and the patient refused  Mental Status Per Nursing Assessment::   On Admission:   suicidal ideations  Current Mental Status by Physician: NA  Loss Factors: NA  Historical Factors: NA  Risk  Reduction Factors:   Sense of responsibility to family  Continued Clinical Symptoms:  Angry, irritable  Cognitive Features That Contribute To Risk:  None    Suicide Risk:  Minimal: No identifiable suicidal ideation.  Patients presenting with no risk factors but with morbid ruminations; may be classified as minimal risk based on the severity of the depressive symptoms  Principal Problem: Cocaine dependence with cocaine-induced mood disorder [facility]) Discharge Diagnoses:  Patient Active Problem List   Diagnosis Date Noted  . Substance induced mood disorder ([place]) [F19.94] 10/03/2015    Priority: High  . Cocaine dependence with cocaine-induced mood disorder [facility]) [F14.24] 10/03/2015    Priority: High  . Major depressive disorder, recurrent episode ([place]) [F33.9] 11/08/2015  . Alcohol dependence with uncomplicated withdrawal ([place]) [F10.230]   . Thyroid activity decreased [E03.9]   . Alcohol dependence ([place]) [F10.20] 10/03/2015  . Hypothyroidism [E03.9] 10/03/2015      Plan Of Care/Follow-up recommendations:  Activity:  as tolerated Diet:  heart healthy diet  Is patient on multiple antipsychotic therapies at discharge:  No   Has Patient had three or more failed trials of antipsychotic monotherapy by history:  No  Recommended Plan for Multiple Antipsychotic Therapies: NA    Abdimalik Mayorquin, PMH-NP 11/14/2015, 9:53 AM

## 2015-12-20 ENCOUNTER — Encounter (HOSPITAL_COMMUNITY): Payer: Self-pay | Admitting: *Deleted

## 2015-12-20 ENCOUNTER — Emergency Department (HOSPITAL_COMMUNITY)
Admission: EM | Admit: 2015-12-20 | Discharge: 2015-12-21 | Disposition: A | Discharge status: Home / Self Care | Payer: BLUE CROSS/BLUE SHIELD | Attending: Emergency Medicine | Admitting: Emergency Medicine

## 2015-12-20 DIAGNOSIS — R45850 Homicidal ideations: Secondary | ICD-10-CM | POA: Insufficient documentation

## 2015-12-20 DIAGNOSIS — Z79899 Other long term (current) drug therapy: Secondary | ICD-10-CM | POA: Insufficient documentation

## 2015-12-20 DIAGNOSIS — F17210 Nicotine dependence, cigarettes, uncomplicated: Secondary | ICD-10-CM | POA: Diagnosis not present

## 2015-12-20 DIAGNOSIS — E079 Disorder of thyroid, unspecified: Secondary | ICD-10-CM | POA: Diagnosis not present

## 2015-12-20 DIAGNOSIS — I10 Essential (primary) hypertension: Secondary | ICD-10-CM | POA: Insufficient documentation

## 2015-12-20 DIAGNOSIS — F1424 Cocaine dependence with cocaine-induced mood disorder: Secondary | ICD-10-CM | POA: Diagnosis not present

## 2015-12-20 DIAGNOSIS — R45851 Suicidal ideations: Secondary | ICD-10-CM | POA: Diagnosis present

## 2015-12-20 DIAGNOSIS — F1310 Sedative, hypnotic or anxiolytic abuse, uncomplicated: Secondary | ICD-10-CM | POA: Diagnosis not present

## 2015-12-20 DIAGNOSIS — F329 Major depressive disorder, single episode, unspecified: Secondary | ICD-10-CM | POA: Insufficient documentation

## 2015-12-20 DIAGNOSIS — F1210 Cannabis abuse, uncomplicated: Secondary | ICD-10-CM | POA: Diagnosis not present

## 2015-12-20 DIAGNOSIS — Z590 Homelessness: Secondary | ICD-10-CM | POA: Diagnosis not present

## 2015-12-20 DIAGNOSIS — F1410 Cocaine abuse, uncomplicated: Secondary | ICD-10-CM | POA: Diagnosis present

## 2015-12-20 LAB — CBC WITH DIFFERENTIAL/PLATELET
BASOS ABS: 0 10*3/uL (ref 0.0–0.1)
BASOS PCT: 0 %
EOS ABS: 0.1 10*3/uL (ref 0.0–0.7)
Eosinophils Relative: 1 %
HCT: 40.3 % (ref 39.0–52.0)
Hemoglobin: 13.6 g/dL (ref 13.0–17.0)
Lymphocytes Relative: 41 %
Lymphs Abs: 3.8 10*3/uL (ref 0.7–4.0)
MCH: 34.2 pg — ABNORMAL HIGH (ref 26.0–34.0)
MCHC: 33.7 g/dL (ref 30.0–36.0)
MCV: 101.3 fL — ABNORMAL HIGH (ref 78.0–100.0)
MONO ABS: 0.6 10*3/uL (ref 0.1–1.0)
MONOS PCT: 6 %
NEUTROS ABS: 4.8 10*3/uL (ref 1.7–7.7)
NEUTROS PCT: 52 %
Platelets: 298 10*3/uL (ref 150–400)
RBC: 3.98 MIL/uL — ABNORMAL LOW (ref 4.22–5.81)
RDW: 12.3 % (ref 11.5–15.5)
WBC: 9.3 10*3/uL (ref 4.0–10.5)

## 2015-12-20 LAB — COMPREHENSIVE METABOLIC PANEL
ALBUMIN: 5.2 g/dL — AB (ref 3.5–5.0)
ALT: 24 U/L (ref 17–63)
ANION GAP: 10 (ref 5–15)
AST: 40 U/L (ref 15–41)
Alkaline Phosphatase: 77 U/L (ref 38–126)
BUN: 23 mg/dL — AB (ref 6–20)
CHLORIDE: 111 mmol/L (ref 101–111)
CO2: 22 mmol/L (ref 22–32)
Calcium: 10.3 mg/dL (ref 8.9–10.3)
Creatinine, Ser: 1.03 mg/dL (ref 0.61–1.24)
GFR calc Af Amer: >60 mL/min (ref >60)
GFR calc non Af Amer: >60 mL/min (ref >60)
GLUCOSE: 113 mg/dL — AB (ref 65–99)
POTASSIUM: 4.1 mmol/L (ref 3.5–5.1)
SODIUM: 143 mmol/L (ref 135–145)
Total Bilirubin: 0.7 mg/dL (ref 0.3–1.2)
Total Protein: 8.7 g/dL — ABNORMAL HIGH (ref 6.5–8.1)

## 2015-12-20 LAB — ETHANOL

## 2015-12-20 MED ORDER — CYCLOBENZAPRINE HCL 10 MG PO TABS
10.0000 mg | ORAL_TABLET | Freq: Three times a day (TID) | ORAL | Status: DC
  Administered 2015-12-21: 10 mg via ORAL
  Filled 2015-12-20: qty 1

## 2015-12-20 MED ORDER — ACETAMINOPHEN 325 MG PO TABS: 650.0000 mg | ORAL_TABLET | ORAL | Status: DC | PRN

## 2015-12-20 MED ORDER — ALUM & MAG HYDROXIDE-SIMETH 200-200-20 MG/5ML PO SUSP: 30.0000 mL | ORAL | Status: DC | PRN

## 2015-12-20 MED ORDER — TRAZODONE HCL 100 MG PO TABS
100.0000 mg | ORAL_TABLET | Freq: Every evening | ORAL | Status: DC | PRN
Start: 2015-12-20 — End: 2015-12-21

## 2015-12-20 MED ORDER — IBUPROFEN 200 MG PO TABS: 600.0000 mg | ORAL_TABLET | Freq: Three times a day (TID) | ORAL | Status: DC | PRN

## 2015-12-20 MED ORDER — LORAZEPAM 1 MG PO TABS
1.0000 mg | ORAL_TABLET | Freq: Three times a day (TID) | ORAL | Status: DC | PRN
  Administered 2015-12-21: 1 mg via ORAL
  Filled 2015-12-20: qty 1

## 2015-12-20 MED ORDER — HYDROXYZINE HCL 25 MG PO TABS
25.0000 mg | ORAL_TABLET | Freq: Four times a day (QID) | ORAL | Status: DC | PRN
  Administered 2015-12-21: 25 mg via ORAL
  Filled 2015-12-20: qty 1

## 2015-12-20 MED ORDER — ONDANSETRON HCL 4 MG PO TABS: 4.0000 mg | ORAL_TABLET | Freq: Three times a day (TID) | ORAL | Status: DC | PRN

## 2015-12-20 MED ORDER — QUETIAPINE FUMARATE 100 MG PO TABS
100.0000 mg | ORAL_TABLET | Freq: Every day | ORAL | Status: DC
  Administered 2015-12-21: 100 mg via ORAL
  Filled 2015-12-20: qty 1

## 2015-12-20 MED ORDER — LEVOTHYROXINE SODIUM 100 MCG PO TABS
100.0000 ug | ORAL_TABLET | Freq: Every day | ORAL | Status: DC
  Administered 2015-12-21: 100 ug via ORAL
  Filled 2015-12-20 (×3): qty 1

## 2015-12-20 MED ORDER — GABAPENTIN 300 MG PO CAPS
300.0000 mg | ORAL_CAPSULE | Freq: Three times a day (TID) | ORAL | Status: DC
  Administered 2015-12-21 (×2): 300 mg via ORAL
  Filled 2015-12-20 (×2): qty 1
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
## 2015-12-20 NOTE — ED Notes (Signed)
Pt brought to the ER via [employer] for suicidal thoughts; pt states "I don't want to be here anymore. My life isn't getting any better" pt denies SI plan states he just doesn't want to be here but does not have a plan; denies HI; pt states that he got laid off Dec and has been homeless for "awhile"; pt states that he does not have any family in the area and was supposed to go to [facility] a few months ago for "long term rehab" and was provided a bus ticket but states that he was unable to go; pt states "If you don't do anything for me I will just leave and come right back"; pt states "I need a job, a place to live and for my life to improve"; pt states that he is not using drugs as often but states that he spent $60 on cocaine today; pt states he doesn't have bus fair to go anywhere for help; pt states that he goes to the [facility] but they aren't helping him much because his life hasn't changed any since he has been doing this for awhile and it's not any better
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
## 2015-12-20 NOTE — ED Notes (Signed)
Pt brought to ED by [employer], pt admits to SI to [employer]. Plan ? Death by police. Per [employer] pt has recently experienced hardships.

## 2015-12-21 DIAGNOSIS — F1424 Cocaine dependence with cocaine-induced mood disorder: Secondary | ICD-10-CM | POA: Diagnosis not present

## 2015-12-21 DIAGNOSIS — F1410 Cocaine abuse, uncomplicated: Secondary | ICD-10-CM | POA: Diagnosis present

## 2015-12-21 LAB — RAPID URINE DRUG SCREEN, HOSP PERFORMED
Amphetamines: NOT DETECTED
Barbiturates: NOT DETECTED
Benzodiazepines: POSITIVE — AB
Cocaine: POSITIVE — AB
OPIATES: NOT DETECTED
Tetrahydrocannabinol: POSITIVE — AB

## 2015-12-21 MED ORDER — NICOTINE 21 MG/24HR TD PT24
21.0000 mg | MEDICATED_PATCH | Freq: Every day | TRANSDERMAL | Status: DC
  Filled 2015-12-21: qty 1
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
## 2015-12-21 NOTE — ED Notes (Signed)
Belongings placed in locker 36

## 2015-12-21 NOTE — ED Notes (Signed)
Pt wanded by security. 

## 2015-12-21 NOTE — ED Notes (Signed)
Pt. Noted sleeping in room. No complaints or concerns voiced. No distress or abnormal behavior noted. Will continue to monitor with security cameras. Q 15 minute rounds continue. 

## 2015-12-21 NOTE — BH Assessment (Signed)
[place] Assessment Progress Note  Per Corena Pilgrim, MD, this pt does not require psychiatric hospitalization at this time.  Pt is to be discharged from [facility] with residential and outpatient substance abuse treatment referrals.  Discharge instructions advise pt to follow up with [facility], [facility], or [facility] for residential treatment, or the [facility] or [facility] for outpatient treatment.  Pt's nurse, Nena Jordan, has been notified.  Jalene Mullet, [place] Triage Specialist [phone]

## 2015-12-21 NOTE — ED Notes (Signed)
Pt discharged ambulatory in no distress.  Discharged instructions were reviewed and all belonging were returned to pt.  Bus pass given to pt.

## 2015-12-21 NOTE — Discharge Instructions (Signed)
To help you maintain a sober lifestyle, a substance abuse treatment program may be beneficial to you.  Contact one of the following treatment providers to see about enrolling in their program:  RESIDENTIAL PROGRAMS:       [place], [place] [address]      [phone]      [address] [place], [place] [address]      [phone]       [facility] [address]      [address]      [place], [place] [address]       [phone]       [facility]      [place], [place] [address]      [phone]  OUTPATIENT PROGRAMS:       [facility]      [address] [place], [place] [address]      [phone]      New patients are seen at the walk-in clinic every Tuesday from 9:00 am - 12:00 pm.       [facility]      [place], [place] [address]      [phone]

## 2015-12-21 NOTE — [facility] Suicide Risk Assessment (Signed)
Suicide Risk Assessment  Discharge Assessment   [facility] Discharge Suicide Risk Assessment   Principal Problem: Cocaine dependence with cocaine-induced mood disorder [facility]) Discharge Diagnoses:  Patient Active Problem List   Diagnosis Date Noted  . Cocaine abuse [F14.10] 12/21/2015    Priority: High  . Cocaine dependence with cocaine-induced mood disorder [facility]) [F14.24] 10/03/2015    Priority: High  . Major depressive disorder, recurrent episode ([place]) [F33.9] 11/08/2015  . Alcohol dependence with uncomplicated withdrawal ([place]) [F10.230]   . Thyroid activity decreased [E03.9]   . Alcohol dependence ([place]) [F10.20] 10/03/2015  . Hypothyroidism [E03.9] 10/03/2015    Total Time spent with patient: 45 minutes   Musculoskeletal: Strength & Muscle Tone: within normal limits Gait & Station: normal Patient leans: N/A  Psychiatric Specialty Exam: Review of Systems  Constitutional: Negative.   HENT: Negative.   Eyes: Negative.   Respiratory: Negative.   Cardiovascular: Negative.   Gastrointestinal: Negative.   Genitourinary: Negative.   Musculoskeletal: Negative.   Skin: Negative.   Neurological: Negative.   Endo/Heme/Allergies: Negative.   Psychiatric/Behavioral: Positive for substance abuse.    Blood pressure 128/83, pulse 67, temperature 97.1 F (36.2 C), temperature source Oral, resp. rate 16, SpO2 98 %.There is no weight on file to calculate BMI.  General Appearance: Casual  Eye Contact::  Good  Speech:  Normal Rate  Volume:  Normal  Mood:  Irritable  Affect:  Congruent  Thought Process:  Coherent  Orientation:  Full (Time, Place, and Person)  Thought Content:  WDL  Suicidal Thoughts:  No  Homicidal Thoughts:  No  Memory:  Immediate;   Good Recent;   Good Remote;   Good  Judgement:  Fair  Insight:  Fair  Psychomotor Activity:  Normal  Concentration:  Good  Recall:  Good  Fund of Knowledge:Good  Language: Good  Akathisia:  No  Handed:  Right  AIMS (if indicated):      Assets:  Leisure Time Physical Health Resilience  ADL's:  Intact  Cognition: WNL  Sleep:      Mental Status Per Nursing Assessment::   On Admission:   cocaine abuse with suicidal/homicidal ideations  Demographic Factors:  Male  Loss Factors: Legal issues  Historical Factors: NA  Risk Reduction Factors:   Sense of responsibility to family  Continued Clinical Symptoms:  Anxiety, mild  Cognitive Features That Contribute To Risk:  None    Suicide Risk:  Minimal: No identifiable suicidal ideation.  Patients presenting with no risk factors but with morbid ruminations; may be classified as minimal risk based on the severity of the depressive symptoms    Plan Of Care/Follow-up recommendations:  Activity:  as tolerated Diet:  heart healthy diet  LORD, JAMISON, NP 12/21/2015, 11:34 AM

## 2015-12-21 NOTE — ED Notes (Signed)
Pt. To [facility] from ED ambulatory without difficulty, to room 36 . Report from [facility]. Pt. Is alert and oriented, warm and dry in no distress. Pt. Denies SI, HI, and AVH. Pt. Calm and cooperative. Pt. Made aware of security cameras and Q15 minute rounds. Pt. Encouraged to let Nursing staff know of any concerns or needs.

## 2015-12-21 NOTE — ED Notes (Signed)
Pt has in belonging bag:  Blue jeans, blue and white tennis shoes, grey socks, blue collar shirt, black t-shirt, black socks, orange hoodie, lighter, [place] cigs boxes,

## 2016-07-17 ENCOUNTER — Encounter (HOSPITAL_COMMUNITY): Payer: Self-pay | Admitting: Oncology

## 2016-07-17 ENCOUNTER — Emergency Department (HOSPITAL_COMMUNITY)
Admission: EM | Admit: 2016-07-17 | Discharge: 2016-07-17 | Disposition: A | Discharge status: Home / Self Care | Payer: BLUE CROSS/BLUE SHIELD | Attending: Emergency Medicine | Admitting: Emergency Medicine

## 2016-07-17 DIAGNOSIS — Z79899 Other long term (current) drug therapy: Secondary | ICD-10-CM | POA: Diagnosis not present

## 2016-07-17 DIAGNOSIS — E039 Hypothyroidism, unspecified: Secondary | ICD-10-CM | POA: Insufficient documentation

## 2016-07-17 DIAGNOSIS — R45851 Suicidal ideations: Secondary | ICD-10-CM | POA: Diagnosis present

## 2016-07-17 DIAGNOSIS — F17210 Nicotine dependence, cigarettes, uncomplicated: Secondary | ICD-10-CM | POA: Diagnosis not present

## 2016-07-17 DIAGNOSIS — F1424 Cocaine dependence with cocaine-induced mood disorder: Secondary | ICD-10-CM

## 2016-07-17 DIAGNOSIS — F329 Major depressive disorder, single episode, unspecified: Secondary | ICD-10-CM | POA: Insufficient documentation

## 2016-07-17 DIAGNOSIS — I10 Essential (primary) hypertension: Secondary | ICD-10-CM | POA: Insufficient documentation

## 2016-07-17 LAB — COMPREHENSIVE METABOLIC PANEL
ALT: 26 U/L (ref 17–63)
AST: 28 U/L (ref 15–41)
Albumin: 4.7 g/dL (ref 3.5–5.0)
Alkaline Phosphatase: 74 U/L (ref 38–126)
Anion gap: 9 (ref 5–15)
BUN: 15 mg/dL (ref 6–20)
CALCIUM: 9.5 mg/dL (ref 8.9–10.3)
CHLORIDE: 107 mmol/L (ref 101–111)
CO2: 23 mmol/L (ref 22–32)
CREATININE: 0.95 mg/dL (ref 0.61–1.24)
GFR calc Af Amer: >60 mL/min (ref >60)
Glucose, Bld: 79 mg/dL (ref 65–99)
POTASSIUM: 4.1 mmol/L (ref 3.5–5.1)
SODIUM: 139 mmol/L (ref 135–145)
Total Bilirubin: 0.7 mg/dL (ref 0.3–1.2)
Total Protein: 7.9 g/dL (ref 6.5–8.1)

## 2016-07-17 LAB — CBC
HCT: 38.5 % — ABNORMAL LOW (ref 39.0–52.0)
Hemoglobin: 12.8 g/dL — ABNORMAL LOW (ref 13.0–17.0)
MCH: 34 pg (ref 26.0–34.0)
MCHC: 33.2 g/dL (ref 30.0–36.0)
MCV: 102.1 fL — ABNORMAL HIGH (ref 78.0–100.0)
PLATELETS: 258 10*3/uL (ref 150–400)
RBC: 3.77 MIL/uL — AB (ref 4.22–5.81)
RDW: 13.2 % (ref 11.5–15.5)
WBC: 12.3 10*3/uL — ABNORMAL HIGH (ref 4.0–10.5)

## 2016-07-17 LAB — RAPID URINE DRUG SCREEN, HOSP PERFORMED
AMPHETAMINES: NOT DETECTED
Barbiturates: NOT DETECTED
Benzodiazepines: NOT DETECTED
COCAINE: POSITIVE — AB
OPIATES: NOT DETECTED
Tetrahydrocannabinol: POSITIVE — AB

## 2016-07-17 LAB — ETHANOL: ALCOHOL ETHYL (B): 6 mg/dL — AB (ref <5)

## 2016-07-17 LAB — ACETAMINOPHEN LEVEL

## 2016-07-17 LAB — SALICYLATE LEVEL

## 2016-07-17 MED ORDER — NICOTINE 21 MG/24HR TD PT24
21.0000 mg | MEDICATED_PATCH | Freq: Every day | TRANSDERMAL | Status: DC
  Filled 2016-07-17: qty 1

## 2016-07-17 MED ORDER — LEVOTHYROXINE SODIUM 100 MCG PO TABS: 100.0000 ug | ORAL_TABLET | Freq: Every day | ORAL | 0 refills | Status: DC

## 2016-07-17 MED ORDER — GABAPENTIN 300 MG PO CAPS
300.0000 mg | ORAL_CAPSULE | Freq: Three times a day (TID) | ORAL | Status: DC
  Administered 2016-07-17: 300 mg via ORAL
  Filled 2016-07-17: qty 1

## 2016-07-17 MED ORDER — TRAZODONE HCL 100 MG PO TABS: 100.0000 mg | ORAL_TABLET | Freq: Every evening | ORAL | 0 refills | Status: DC | PRN

## 2016-07-17 MED ORDER — ACETAMINOPHEN 325 MG PO TABS: 650.0000 mg | ORAL_TABLET | ORAL | Status: DC | PRN

## 2016-07-17 MED ORDER — IBUPROFEN 200 MG PO TABS: 600.0000 mg | ORAL_TABLET | Freq: Three times a day (TID) | ORAL | Status: DC | PRN

## 2016-07-17 MED ORDER — HYDROXYZINE HCL 25 MG PO TABS
25.0000 mg | ORAL_TABLET | Freq: Four times a day (QID) | ORAL | Status: DC | PRN
Start: 2016-07-17 — End: 2016-07-17

## 2016-07-17 MED ORDER — QUETIAPINE FUMARATE 100 MG PO TABS: 100.0000 mg | ORAL_TABLET | Freq: Every day | ORAL | 0 refills | Status: DC

## 2016-07-17 MED ORDER — LEVOTHYROXINE SODIUM 100 MCG PO TABS
100.0000 ug | ORAL_TABLET | Freq: Every day | ORAL | Status: DC
  Administered 2016-07-17: 100 ug via ORAL
  Filled 2016-07-17: qty 1

## 2016-07-17 MED ORDER — ONDANSETRON HCL 4 MG PO TABS: 4.0000 mg | ORAL_TABLET | Freq: Three times a day (TID) | ORAL | Status: DC | PRN

## 2016-07-17 MED ORDER — LORAZEPAM 1 MG PO TABS: 0.0000 mg | ORAL_TABLET | Freq: Four times a day (QID) | ORAL | Status: DC

## 2016-07-17 MED ORDER — VITAMIN B-1 100 MG PO TABS
100.0000 mg | ORAL_TABLET | Freq: Every day | ORAL | Status: DC
  Administered 2016-07-17: 100 mg via ORAL
  Filled 2016-07-17: qty 1

## 2016-07-17 MED ORDER — TRAZODONE HCL 100 MG PO TABS: 100.0000 mg | ORAL_TABLET | Freq: Every evening | ORAL | Status: DC | PRN

## 2016-07-17 MED ORDER — ALUM & MAG HYDROXIDE-SIMETH 200-200-20 MG/5ML PO SUSP: 30.0000 mL | ORAL | Status: DC | PRN

## 2016-07-17 MED ORDER — ZOLPIDEM TARTRATE 5 MG PO TABS: 5.0000 mg | ORAL_TABLET | Freq: Every evening | ORAL | Status: DC | PRN

## 2016-07-17 MED ORDER — QUETIAPINE FUMARATE 100 MG PO TABS: 100.0000 mg | ORAL_TABLET | Freq: Every day | ORAL | Status: DC

## 2016-07-17 MED ORDER — GABAPENTIN 300 MG PO CAPS: 300.0000 mg | ORAL_CAPSULE | Freq: Three times a day (TID) | ORAL | 0 refills | Status: DC

## 2016-07-17 MED ORDER — THIAMINE HCL 100 MG/ML IJ SOLN: 100.0000 mg | Freq: Every day | INTRAMUSCULAR | Status: DC

## 2016-07-17 MED ORDER — LORAZEPAM 1 MG PO TABS: 0.0000 mg | ORAL_TABLET | Freq: Two times a day (BID) | ORAL | Status: DC

## 2016-07-17 MED ORDER — HYDROXYZINE HCL 25 MG PO TABS: 25.0000 mg | ORAL_TABLET | Freq: Four times a day (QID) | ORAL | 0 refills | Status: DC | PRN

## 2016-07-17 NOTE — ED Triage Notes (Signed)
Pt presents d/t SI w/o a plan.  Pt reported that he used to get psych medications from [facility] however has not had any medication x 2 months.  Pt is A&O x4.  Calm and cooperative at this time.

## 2016-07-17 NOTE — Consult Note (Signed)
[name]   Reason for Consult:  Suicidal ideations with cocaine abuse Referring Physician:  EDP Patient Identification: Johnathan Baker MRN:  [id number] Principal Diagnosis: Cocaine dependence with cocaine-induced mood disorder [facility]) Diagnosis:   Patient Active Problem List   Diagnosis Date Noted  . Cocaine abuse [F14.10] 12/21/2015    Priority: High  . Cocaine dependence with cocaine-induced mood disorder [facility]) [F14.24] 10/03/2015    Priority: High  . Major depressive disorder, recurrent episode ([place]) [F33.9] 11/08/2015  . Alcohol dependence with uncomplicated withdrawal ([place]) [F10.230]   . Thyroid activity decreased [E03.9]   . Alcohol dependence ([place]) [F10.20] 10/03/2015  . Hypothyroidism [E03.9] 10/03/2015    Total Time spent with patient: 45 minutes  Subjective:   Johnathan Baker is a 45 y.o. male patient does not warrant admission.  HPI:  45 yo male who presented to the ED with suicidal ideations and hallucinations after using cocaine.  Today, he is not suicidal/homicidal, hallucinating, or experiencing withdrawal symptoms.  Feels "better" after sleep.  Rx will be provided with follow-up outpatient resources.  Stable for discharge.  Past Psychiatric History: cocaine abuse  Risk to Self: Suicidal Ideation: Yes-Currently Present Suicidal Intent: No Is patient at risk for suicide?: No Suicidal Plan?: No Access to Means: No What has been your use of drugs/alcohol within the last 12 months?: Alcohol, cocaine and THC use reported.  How many times?: 2 Other Self Harm Risks: Denies  Triggers for Past Attempts: Unpredictable, Other (Comment) (Substance use ) Intentional Self Injurious Behavior: None Risk to Others: Homicidal Ideation: No Thoughts of Harm to Others: No Current Homicidal Intent: No Current Homicidal Plan: No Access to Homicidal Means: No Identified Victim: N/A History of harm to others?: No Assessment of Violence: None Noted Violent  Behavior Description: No violent behaviors reported.  Does patient have access to weapons?: No Criminal Charges Pending?: No Does patient have a court date: No Prior Inpatient Therapy: Prior Inpatient Therapy: Yes Prior Therapy Dates: 11/10, 4/11, 11/16, 1/17 Prior Therapy Facilty/Provider(s): [facility]  Reason for Treatment: Depression/Substance abuse  Prior Outpatient Therapy: Prior Outpatient Therapy: Yes Prior Therapy Dates: 2017 Prior Therapy Facilty/Provider(s): [facility]  Reason for Treatment: Medication mangement  Does patient have an ACCT team?: No Does patient have Intensive In-House Services?  : No Does patient have [facility] services? : No Does patient have P4CC services?: No  Past Medical History:  Past Medical History:  Diagnosis Date  . Back pain   . Depression   . Hypertension   . Thyroid disease    History reviewed. No pertinent surgical history. Family History: History reviewed. No pertinent family history. Family Psychiatric  History: none Social History:  History  Alcohol Use  . Yes    Comment: binge drinks     History  Drug Use  . Frequency: 0.5 times per week  . Types: Cocaine, Marijuana    Comment: last used yesterday    Social History   Social History  . Marital status: Married    Spouse name: N/A  . Number of children: N/A  . Years of education: N/A   Social History Main Topics  . Smoking status: Current Every Day Smoker    Packs/day: 0.50    Types: Cigarettes  . Smokeless tobacco: Never Used  . Alcohol use Yes     Comment: binge drinks  . Drug use:     Frequency: 0.5 times per week    Types: Cocaine, Marijuana     Comment: last used yesterday  .  Sexual activity: Not Asked   Other Topics Concern  . None   Social History Narrative  . None   Additional Social History:    Allergies:  No Known Allergies  Labs:  Results for orders placed or performed during the hospital encounter of 07/17/16 (from the past 48 hour(s))   Comprehensive metabolic panel     Status: None   Collection Time: 07/17/16  2:40 AM  Result Value Ref Range   Sodium 139 135 - 145 mmol/L   Potassium 4.1 3.5 - 5.1 mmol/L   Chloride 107 101 - 111 mmol/L   CO2 23 22 - 32 mmol/L   Glucose, Bld 79 65 - 99 mg/dL   BUN 15 6 - 20 mg/dL   Creatinine, Ser 0.95 0.61 - 1.24 mg/dL   Calcium 9.5 8.9 - 10.3 mg/dL   Total Protein 7.9 6.5 - 8.1 g/dL   Albumin 4.7 3.5 - 5.0 g/dL   AST 28 15 - 41 U/L   ALT 26 17 - 63 U/L   Alkaline Phosphatase 74 38 - 126 U/L   Total Bilirubin 0.7 0.3 - 1.2 mg/dL   GFR calc non Af Amer >60 >60 mL/min   GFR calc Af Amer >60 >60 mL/min    Comment: (NOTE) The eGFR has been calculated using the CKD EPI equation. This calculation has not been validated in all clinical situations. eGFR's persistently <60 mL/min signify possible Chronic Kidney Disease.    Anion gap 9 5 - 15  Ethanol     Status: Abnormal   Collection Time: 07/17/16  2:40 AM  Result Value Ref Range   Alcohol, Ethyl (B) 6 (H) <5 mg/dL    Comment:        LOWEST DETECTABLE LIMIT FOR SERUM ALCOHOL IS 5 mg/dL FOR MEDICAL PURPOSES ONLY   Salicylate level     Status: None   Collection Time: 07/17/16  2:40 AM  Result Value Ref Range   Salicylate Lvl <8.5 2.8 - 30.0 mg/dL  Acetaminophen level     Status: Abnormal   Collection Time: 07/17/16  2:40 AM  Result Value Ref Range   Acetaminophen (Tylenol), Serum <10 (L) 10 - 30 ug/mL    Comment:        THERAPEUTIC CONCENTRATIONS VARY SIGNIFICANTLY. A RANGE OF 10-30 ug/mL MAY BE AN EFFECTIVE CONCENTRATION FOR MANY PATIENTS. HOWEVER, SOME ARE BEST TREATED AT CONCENTRATIONS OUTSIDE THIS RANGE. ACETAMINOPHEN CONCENTRATIONS >150 ug/mL AT 4 HOURS AFTER INGESTION AND >50 ug/mL AT 12 HOURS AFTER INGESTION ARE OFTEN ASSOCIATED WITH TOXIC REACTIONS.   cbc     Status: Abnormal   Collection Time: 07/17/16  2:40 AM  Result Value Ref Range   WBC 12.3 (H) 4.0 - 10.5 K/uL   RBC 3.77 (L) 4.22 - 5.81 MIL/uL    Hemoglobin 12.8 (L) 13.0 - 17.0 g/dL   HCT 38.5 (L) 39.0 - 52.0 %   MCV 102.1 (H) 78.0 - 100.0 fL   MCH 34.0 26.0 - 34.0 pg   MCHC 33.2 30.0 - 36.0 g/dL   RDW 13.2 11.5 - 15.5 %   Platelets 258 150 - 400 K/uL  Rapid urine drug screen (hospital performed)     Status: Abnormal   Collection Time: 07/17/16  3:59 AM  Result Value Ref Range   Opiates NONE DETECTED NONE DETECTED   Cocaine POSITIVE (A) NONE DETECTED   Benzodiazepines NONE DETECTED NONE DETECTED   Amphetamines NONE DETECTED NONE DETECTED   Tetrahydrocannabinol POSITIVE (A) NONE DETECTED   Barbiturates  NONE DETECTED NONE DETECTED    Comment:        DRUG SCREEN FOR MEDICAL PURPOSES ONLY.  IF CONFIRMATION IS NEEDED FOR ANY PURPOSE, NOTIFY LAB WITHIN 5 DAYS.        LOWEST DETECTABLE LIMITS FOR URINE DRUG SCREEN Drug Class       Cutoff (ng/mL) Amphetamine      1000 Barbiturate      200 Benzodiazepine   161 Tricyclics       096 Opiates          300 Cocaine          300 THC              50     Current Facility-Administered Medications  Medication Dose Route Frequency Provider Last Rate Last Dose  . acetaminophen (TYLENOL) tablet 650 mg  650 mg Oral Q4H PRN Orpah Greek, MD      . alum & mag hydroxide-simeth (MAALOX/MYLANTA) 200-200-20 MG/5ML suspension 30 mL  30 mL Oral PRN Orpah Greek, MD      . gabapentin (NEURONTIN) capsule 300 mg  300 mg Oral TID Orpah Greek, MD   300 mg at 07/17/16 1052  . hydrOXYzine (ATARAX/VISTARIL) tablet 25 mg  25 mg Oral Q6H PRN Orpah Greek, MD      . ibuprofen (ADVIL,MOTRIN) tablet 600 mg  600 mg Oral Q8H PRN Orpah Greek, MD      . levothyroxine (SYNTHROID, LEVOTHROID) tablet 100 mcg  100 mcg Oral QAC breakfast Orpah Greek, MD   100 mcg at 07/17/16 0859  . nicotine (NICODERM CQ - dosed in mg/24 hours) patch 21 mg  21 mg Transdermal Daily Orpah Greek, MD      . ondansetron (ZOFRAN) tablet 4 mg  4 mg Oral Q8H PRN Orpah Greek, MD      . QUEtiapine (SEROQUEL) tablet 100 mg  100 mg Oral QHS Orpah Greek, MD      . thiamine (VITAMIN B-1) tablet 100 mg  100 mg Oral Daily Orpah Greek, MD   100 mg at 07/17/16 1052   Or  . thiamine (B-1) injection 100 mg  100 mg Intravenous Daily Orpah Greek, MD      . traZODone (DESYREL) tablet 100 mg  100 mg Oral QHS PRN Orpah Greek, MD      . zolpidem (AMBIEN) tablet 5 mg  5 mg Oral QHS PRN Orpah Greek, MD       Current Outpatient Prescriptions  Medication Sig Dispense Refill  . cyclobenzaprine (FLEXERIL) 10 MG tablet Take 1 tablet (10 mg total) by mouth 3 (three) times daily. (Patient taking differently: Take 10 mg by mouth 3 (three) times daily as needed for muscle spasms. ) 30 tablet 0  . gabapentin (NEURONTIN) 300 MG capsule Take 1 capsule (300 mg total) by mouth 3 (three) times daily. (Patient taking differently: Take 300 mg by mouth 3 (three) times daily as needed (for neuropathy pain.). ) 90 capsule 0  . hydrOXYzine (ATARAX/VISTARIL) 25 MG tablet Take 1 tablet (25 mg total) by mouth every 6 (six) hours as needed for anxiety. 45 tablet 0  . levothyroxine (SYNTHROID, LEVOTHROID) 100 MCG tablet Take 1 tablet (100 mcg total) by mouth daily before breakfast. 30 tablet 0  . QUEtiapine (SEROQUEL) 100 MG tablet Take 1 tablet (100 mg total) by mouth at bedtime. 30 tablet 0  . traZODone (DESYREL) 100 MG tablet Take 1 tablet (100  mg total) by mouth at bedtime as needed for sleep. 30 tablet 0  . nicotine polacrilex (NICORETTE) 2 MG gum Take 1 each (2 mg total) by mouth as needed for smoking cessation. (Patient not taking: Reported on 11/13/2015) 100 tablet 0    Musculoskeletal: Strength & Muscle Tone: within normal limits Gait & Station: normal Patient leans: N/A  Psychiatric Specialty Exam: Physical Exam  Constitutional: He is oriented to person, place, and time. He appears well-developed and well-nourished.  HENT:  Head:  Normocephalic.  Neck: Normal range of motion.  Respiratory: Effort normal.  Musculoskeletal: Normal range of motion.  Neurological: He is alert and oriented to person, place, and time.  Skin: Skin is warm and dry.  Psychiatric: His speech is normal and behavior is normal. Judgment and thought content normal. Cognition and memory are normal. He exhibits a depressed mood.    Review of Systems  Constitutional: Negative.   HENT: Negative.   Eyes: Negative.   Respiratory: Negative.   Cardiovascular: Negative.   Gastrointestinal: Negative.   Genitourinary: Negative.   Musculoskeletal: Negative.   Skin: Negative.   Neurological: Negative.   Endo/Heme/Allergies: Negative.   Psychiatric/Behavioral: Positive for depression and substance abuse.    Blood pressure 159/93, pulse 70, temperature 97.4 F (36.3 C), temperature source Oral, resp. rate 16, height _0  (1.956 m), weight 108.9 kg (240 lb), SpO2 100 %.Body mass index is 28.46 kg/m.  General Appearance: Casual  Eye Contact:  Good  Speech:  Normal Rate  Volume:  Normal  Mood:  Depressed, mild  Affect:  Congruent  Thought Process:  Coherent and Descriptions of Associations: Intact  Orientation:  Full (Time, Place, and Person)  Thought Content:  WDL  Suicidal Thoughts:  No  Homicidal Thoughts:  No  Memory:  Immediate;   Good Recent;   Good Remote;   Good  Judgement:  Fair  Insight:  Fair  Psychomotor Activity:  Normal  Concentration:  Concentration: Good and Attention Span: Good  Recall:  Good  Fund of Knowledge:  Fair  Language:  Good  Akathisia:  No  Handed:  Right  AIMS (if indicated):     Assets:  Leisure Time Physical Health Resilience Social Support  ADL's:  Intact  Cognition:  WNL  Sleep:        Treatment Plan Summary: Daily contact with patient to assess and evaluate symptoms and progress in treatment, Medication management and Plan cocaine abuse with cocaine induced mood disorder:  -Crisis  stabilization -Medication management:  Continue medical medications along with Gabapentin 300 mg TID for withdrawal symptoms, Vistaril 25 mg every six hours PRN anxiety, Seroquel 100 mg at bedtime for mood stabilization, Trazodone 100 mg at bedtime PRN sleep -Individual and substance abuse counseling  Disposition: No evidence of imminent risk to self or others at present.    Waylan Boga, NP 07/17/2016 11:06 AM  Patient seen face-to-face for psychiatric evaluation, chart reviewed and case discussed with the physician extender and developed treatment plan. Reviewed the information documented and agree with the treatment plan. Corena Pilgrim, MD

## 2016-07-17 NOTE — BH Assessment (Signed)
Assessment completed. Consulted Darlyne Russian, PA-C who recommended inpatient treatment. No appropriate beds at [facility]. TTS will seek placement. Informed Dr.Pollina of the recommendation.

## 2016-07-17 NOTE — ED Notes (Signed)
SBAR Report received from previous nurse. Pt received calm and visible on unit. Pt endorses current SI but denies  HI, A/V H, depression, anxiety, and pain at this time, and is otherwise stable. Pt reminded of camera surveillance, q 15 min rounds, and rules of the milieu. Will continue to assess.
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
## 2016-07-17 NOTE — ED Notes (Signed)
Patient discharged to home with all belongings.  He denies thoughts of harm to self or others.  He is not hallucinating and has been cooperative this shift.  He left the unit ambulatory and was escorted to the front lobby.  He was given a bus pass.

## 2016-07-17 NOTE — ED Provider Notes (Signed)
[facility] DEPT Provider Note   CSN: [id number] Arrival date & time: 07/17/16  0157  By signing my name below, I, Georgette Shell, attest that this documentation has been prepared under the direction and in the presence of Orpah Greek, MD. Electronically Signed: Georgette Shell, ED Scribe. 07/17/16. 2:20 AM.  History   Chief Complaint Chief Complaint  Patient presents with  . Suicidal   HPI Comments: Johnathan Baker is a 45 y.o. male with h/o depression who presents to the Emergency Department for feeling increasingly depressed onset one day ago. Pt states that he is apathetic to his well-being and feels he lacks motivation to live. He denies suicidal ideations but states that "it doesn't matter" if he's alive or dead. He notes he is eating and sleeping normally. Pt reports he used to get psych medications from [facility] but he has not had any medications in 2 months. Pt is in no pain at this time. Pt denies fever.  The history is provided by the patient. No language interpreter was used.    Past Medical History:  Diagnosis Date  . Back pain   . Depression   . Hypertension   . Thyroid disease     Patient Active Problem List   Diagnosis Date Noted  . Cocaine abuse 12/21/2015  . Major depressive disorder, recurrent episode ([place]) 11/08/2015  . Alcohol dependence with uncomplicated withdrawal ([place])   . Thyroid activity decreased   . Cocaine dependence with cocaine-induced mood disorder ([place]) 10/03/2015  . Alcohol dependence ([place]) 10/03/2015  . Hypothyroidism 10/03/2015    No past surgical history on file.     Home Medications    Prior to Admission medications   Medication Sig Start Date End Date Taking? Authorizing Provider  cyclobenzaprine (FLEXERIL) 10 MG tablet Take 1 tablet (10 mg total) by mouth 3 (three) times daily. Patient taking differently: Take 10 mg by mouth 3 (three) times daily as needed for muscle spasms.  11/12/15   Benjamine Mola, FNP  gabapentin (NEURONTIN)  300 MG capsule Take 1 capsule (300 mg total) by mouth 3 (three) times daily. Patient taking differently: Take 300 mg by mouth 3 (three) times daily as needed (for neuropathy pain.).  11/12/15   Benjamine Mola, FNP  hydrOXYzine (ATARAX/VISTARIL) 25 MG tablet Take 1 tablet (25 mg total) by mouth every 6 (six) hours as needed for anxiety. 11/12/15   Benjamine Mola, FNP  levothyroxine (SYNTHROID, LEVOTHROID) 100 MCG tablet Take 1 tablet (100 mcg total) by mouth daily before breakfast. 11/12/15   Benjamine Mola, FNP  nicotine polacrilex (NICORETTE) 2 MG gum Take 1 each (2 mg total) by mouth as needed for smoking cessation. Patient not taking: Reported on 11/13/2015 11/12/15   Benjamine Mola, FNP  QUEtiapine (SEROQUEL) 100 MG tablet Take 1 tablet (100 mg total) by mouth at bedtime. 11/12/15   Benjamine Mola, FNP  traZODone (DESYREL) 100 MG tablet Take 1 tablet (100 mg total) by mouth at bedtime as needed for sleep. 11/12/15   Benjamine Mola, FNP    Family History No family history on file.  Social History Social History  Substance Use Topics  . Smoking status: Current Every Day Smoker    Packs/day: 0.50    Types: Cigarettes  . Smokeless tobacco: Not on file  . Alcohol use Yes     Comment: binge drinks     Allergies   Review of patient's allergies indicates no known allergies.   Review of Systems Review  of Systems  Constitutional: Negative for fever.  Psychiatric/Behavioral: Negative for suicidal ideas.  All other systems reviewed and are negative.  Physical Exam Updated Vital Signs BP (!) 140/108 (BP Location: Right Arm)   Pulse 87   Resp 15   Ht 6\' 5"  (1.956 m)   Wt 240 lb (108.9 kg)   SpO2 99%   BMI 28.46 kg/m   Physical Exam  Constitutional: He is oriented to person, place, and time. He appears well-developed and well-nourished. No distress.  HENT:  Head: Normocephalic and atraumatic.  Right Ear: Hearing normal.  Left Ear: Hearing normal.  Nose: Nose normal.  Mouth/Throat:  Oropharynx is clear and moist and mucous membranes are normal.  Eyes: Conjunctivae and EOM are normal. Pupils are equal, round, and reactive to light.  Neck: Normal range of motion. Neck supple.  Cardiovascular: Regular rhythm, S1 normal and S2 normal.  Exam reveals no gallop and no friction rub.   No murmur heard. Pulmonary/Chest: Effort normal and breath sounds normal. No respiratory distress. He exhibits no tenderness.  Abdominal: Soft. Normal appearance and bowel sounds are normal. There is no hepatosplenomegaly. There is no tenderness. There is no rebound, no guarding, no tenderness at McBurney's point and negative Murphy's sign. No hernia.  Musculoskeletal: Normal range of motion.  Neurological: He is alert and oriented to person, place, and time. He has normal strength. No cranial nerve deficit or sensory deficit. Coordination normal. GCS eye subscore is 4. GCS verbal subscore is 5. GCS motor subscore is 6.  Skin: Skin is warm, dry and intact. No rash noted. No cyanosis.  Psychiatric: His speech is normal. He is withdrawn. He exhibits a depressed mood.  No suicidal ideation but states "it doesn't matter" if he's alive.  Nursing note and vitals reviewed.    ED Treatments / Results  DIAGNOSTIC STUDIES: Oxygen Saturation is 99% on RA, normal by my interpretation.    COORDINATION OF CARE: 2:17 AM Discussed treatment plan with pt at bedside which includes counseling and pt agreed to plan.  Labs (all labs ordered are listed, but only abnormal results are displayed) Labs Reviewed - No data to display  EKG  EKG Interpretation None       Radiology No results found.  Procedures Procedures (including critical care time)  Medications Ordered in ED Medications - No data to display   Initial Impression / Assessment and Plan / ED Course  I have reviewed the triage vital signs and the nursing notes.  Pertinent labs & imaging results that were available during my care of the  patient were reviewed by me and considered in my medical decision making (see chart for details).  Clinical Course    Patient presents with complaints of depression. Patient having difficulty functioning because of his severe depression. He has been out of his medications. He is not suicidal but no longer has the will to live and does not feel like it wouldn't matter if he was dead. Patient medically clear for psychiatric evaluation.  Final Clinical Impressions(s) / ED Diagnoses   Final diagnoses:  None  Depression  New Prescriptions New Prescriptions   No medications on file  I personally performed the services described in this documentation, which was scribed in my presence. The recorded information has been reviewed and is accurate.     Orpah Greek, MD 07/17/16 [phone]

## 2016-07-17 NOTE — ED Notes (Signed)
Bed: WTR5 Expected date:  Expected time:  Means of arrival:  Comments: 

## 2016-07-17 NOTE — [facility] Suicide Risk Assessment (Signed)
Suicide Risk Assessment  Discharge Assessment   [facility] Discharge Suicide Risk Assessment   Principal Problem: Cocaine dependence with cocaine-induced mood disorder [facility]) Discharge Diagnoses:  Patient Active Problem List   Diagnosis Date Noted  . Cocaine abuse [F14.10] 12/21/2015    Priority: High  . Cocaine dependence with cocaine-induced mood disorder [facility]) [F14.24] 10/03/2015    Priority: High  . Major depressive disorder, recurrent episode ([place]) [F33.9] 11/08/2015  . Alcohol dependence with uncomplicated withdrawal ([place]) [F10.230]   . Thyroid activity decreased [E03.9]   . Alcohol dependence ([place]) [F10.20] 10/03/2015  . Hypothyroidism [E03.9] 10/03/2015    Total Time spent with patient: 45 minutes  Musculoskeletal: Strength & Muscle Tone: within normal limits Gait & Station: normal Patient leans: N/A  Psychiatric Specialty Exam: Physical Exam  Constitutional: He is oriented to person, place, and time. He appears well-developed and well-nourished.  HENT:  Head: Normocephalic.  Neck: Normal range of motion.  Respiratory: Effort normal.  Musculoskeletal: Normal range of motion.  Neurological: He is alert and oriented to person, place, and time.  Skin: Skin is warm and dry.  Psychiatric: His speech is normal and behavior is normal. Judgment and thought content normal. Cognition and memory are normal. He exhibits a depressed mood.    Review of Systems  Constitutional: Negative.   HENT: Negative.   Eyes: Negative.   Respiratory: Negative.   Cardiovascular: Negative.   Gastrointestinal: Negative.   Genitourinary: Negative.   Musculoskeletal: Negative.   Skin: Negative.   Neurological: Negative.   Endo/Heme/Allergies: Negative.   Psychiatric/Behavioral: Positive for depression and substance abuse.    Blood pressure 159/93, pulse 70, temperature 97.4 F (36.3 C), temperature source Oral, resp. rate 16, height 6\' 5"  (1.956 m), weight 108.9 kg (240 lb), SpO2 100 %.Body mass  index is 28.46 kg/m.  General Appearance: Casual  Eye Contact:  Good  Speech:  Normal Rate  Volume:  Normal  Mood:  Depressed, mild  Affect:  Congruent  Thought Process:  Coherent and Descriptions of Associations: Intact  Orientation:  Full (Time, Place, and Person)  Thought Content:  WDL  Suicidal Thoughts:  No  Homicidal Thoughts:  No  Memory:  Immediate;   Good Recent;   Good Remote;   Good  Judgement:  Fair  Insight:  Fair  Psychomotor Activity:  Normal  Concentration:  Concentration: Good and Attention Span: Good  Recall:  Good  Fund of Knowledge:  Fair  Language:  Good  Akathisia:  No  Handed:  Right  AIMS (if indicated):     Assets:  Leisure Time Physical Health Resilience Social Support  ADL's:  Intact  Cognition:  WNL  Sleep:       Mental Status Per Nursing Assessment::   On Admission:   cocaine abuse with suicidal ideations  Demographic Factors:  Male  Loss Factors: NA  Historical Factors: NA  Risk Reduction Factors:   Sense of responsibility to family, Living with another person, especially a relative and Positive social support  Continued Clinical Symptoms:  Depression, mild  Cognitive Features That Contribute To Risk:  None    Suicide Risk:  Minimal: No identifiable suicidal ideation.  Patients presenting with no risk factors but with morbid ruminations; may be classified as minimal risk based on the severity of the depressive symptoms    Plan Of Care/Follow-up recommendations:  Activity:  as tolerated Diet:  heart healthy diet  LORD, JAMISON, NP 07/17/2016, 11:17 AM

## 2016-09-23 ENCOUNTER — Encounter (HOSPITAL_COMMUNITY): Payer: Self-pay

## 2016-09-23 ENCOUNTER — Encounter (HOSPITAL_COMMUNITY): Payer: Self-pay | Admitting: Emergency Medicine

## 2016-09-23 ENCOUNTER — Emergency Department (HOSPITAL_COMMUNITY)
Admission: EM | Admit: 2016-09-23 | Discharge: 2016-09-23 | Disposition: A | Discharge status: Other Acute Inpatient Hospital | Payer: BLUE CROSS/BLUE SHIELD | Attending: Emergency Medicine | Admitting: Emergency Medicine

## 2016-09-23 ENCOUNTER — Inpatient Hospital Stay (HOSPITAL_COMMUNITY)
Admission: AD | Admit: 2016-09-23 | Discharge: 2016-09-30 | DRG: 885 | Disposition: A | Discharge status: Home / Self Care | Payer: BLUE CROSS/BLUE SHIELD | Source: Intra-hospital | Attending: Psychiatry | Admitting: Psychiatry

## 2016-09-23 DIAGNOSIS — E039 Hypothyroidism, unspecified: Secondary | ICD-10-CM | POA: Insufficient documentation

## 2016-09-23 DIAGNOSIS — I10 Essential (primary) hypertension: Secondary | ICD-10-CM | POA: Diagnosis present

## 2016-09-23 DIAGNOSIS — F1424 Cocaine dependence with cocaine-induced mood disorder: Secondary | ICD-10-CM | POA: Diagnosis present

## 2016-09-23 DIAGNOSIS — E890 Postprocedural hypothyroidism: Secondary | ICD-10-CM | POA: Diagnosis present

## 2016-09-23 DIAGNOSIS — Z79899 Other long term (current) drug therapy: Secondary | ICD-10-CM | POA: Diagnosis not present

## 2016-09-23 DIAGNOSIS — R45851 Suicidal ideations: Secondary | ICD-10-CM | POA: Diagnosis present

## 2016-09-23 DIAGNOSIS — R440 Auditory hallucinations: Secondary | ICD-10-CM | POA: Diagnosis not present

## 2016-09-23 DIAGNOSIS — F332 Major depressive disorder, recurrent severe without psychotic features: Secondary | ICD-10-CM

## 2016-09-23 DIAGNOSIS — F339 Major depressive disorder, recurrent, unspecified: Secondary | ICD-10-CM

## 2016-09-23 DIAGNOSIS — F1099 Alcohol use, unspecified with unspecified alcohol-induced disorder: Secondary | ICD-10-CM | POA: Diagnosis not present

## 2016-09-23 DIAGNOSIS — F17210 Nicotine dependence, cigarettes, uncomplicated: Secondary | ICD-10-CM | POA: Insufficient documentation

## 2016-09-23 DIAGNOSIS — F1410 Cocaine abuse, uncomplicated: Secondary | ICD-10-CM | POA: Diagnosis present

## 2016-09-23 LAB — CBC
HEMATOCRIT: 42 % (ref 39.0–52.0)
HEMOGLOBIN: 14 g/dL (ref 13.0–17.0)
MCH: 34.2 pg — ABNORMAL HIGH (ref 26.0–34.0)
MCHC: 33.3 g/dL (ref 30.0–36.0)
MCV: 102.7 fL — ABNORMAL HIGH (ref 78.0–100.0)
Platelets: 304 10*3/uL (ref 150–400)
RBC: 4.09 MIL/uL — ABNORMAL LOW (ref 4.22–5.81)
RDW: 13.5 % (ref 11.5–15.5)
WBC: 8.7 10*3/uL (ref 4.0–10.5)

## 2016-09-23 LAB — COMPREHENSIVE METABOLIC PANEL
ALK PHOS: 83 U/L (ref 38–126)
ALT: 18 U/L (ref 17–63)
ANION GAP: 9 (ref 5–15)
AST: 35 U/L (ref 15–41)
Albumin: 4.8 g/dL (ref 3.5–5.0)
BILIRUBIN TOTAL: 1 mg/dL (ref 0.3–1.2)
BUN: 16 mg/dL (ref 6–20)
CALCIUM: 9.8 mg/dL (ref 8.9–10.3)
CO2: 26 mmol/L (ref 22–32)
CREATININE: 1.3 mg/dL — AB (ref 0.61–1.24)
Chloride: 105 mmol/L (ref 101–111)
GFR calc non Af Amer: >60 mL/min (ref >60)
GLUCOSE: 93 mg/dL (ref 65–99)
Potassium: 4.1 mmol/L (ref 3.5–5.1)
SODIUM: 140 mmol/L (ref 135–145)
TOTAL PROTEIN: 8.4 g/dL — AB (ref 6.5–8.1)

## 2016-09-23 LAB — TSH: TSH: 11.582 u[IU]/mL — ABNORMAL HIGH (ref 0.350–4.500)

## 2016-09-23 LAB — RAPID URINE DRUG SCREEN, HOSP PERFORMED
Amphetamines: NOT DETECTED
BARBITURATES: NOT DETECTED
Benzodiazepines: NOT DETECTED
Cocaine: POSITIVE — AB
Opiates: NOT DETECTED
TETRAHYDROCANNABINOL: POSITIVE — AB

## 2016-09-23 LAB — ACETAMINOPHEN LEVEL: Acetaminophen (Tylenol), Serum: <10 ug/mL — ABNORMAL LOW (ref 10–30)

## 2016-09-23 LAB — SALICYLATE LEVEL

## 2016-09-23 LAB — ETHANOL: Alcohol, Ethyl (B): <5 mg/dL (ref <5)

## 2016-09-23 MED ORDER — IBUPROFEN 800 MG PO TABS
800.0000 mg | ORAL_TABLET | Freq: Three times a day (TID) | ORAL | Status: DC | PRN
  Administered 2016-09-25 – 2016-09-27 (×4): 800 mg via ORAL
  Filled 2016-09-23 (×4): qty 1

## 2016-09-23 MED ORDER — LEVOTHYROXINE SODIUM 100 MCG PO TABS
100.0000 ug | ORAL_TABLET | Freq: Every day | ORAL | Status: DC
  Filled 2016-09-23: qty 1

## 2016-09-23 MED ORDER — GABAPENTIN 300 MG PO CAPS
300.0000 mg | ORAL_CAPSULE | Freq: Three times a day (TID) | ORAL | Status: DC
  Administered 2016-09-23: 300 mg via ORAL
  Filled 2016-09-23: qty 1

## 2016-09-23 MED ORDER — LEVOTHYROXINE SODIUM 100 MCG PO TABS
100.0000 ug | ORAL_TABLET | Freq: Every day | ORAL | Status: DC
  Administered 2016-09-24 – 2016-09-30 (×7): 100 ug via ORAL
  Filled 2016-09-23 (×9): qty 1

## 2016-09-23 MED ORDER — TRAZODONE HCL 100 MG PO TABS: 100.0000 mg | ORAL_TABLET | Freq: Every evening | ORAL | Status: DC | PRN

## 2016-09-23 MED ORDER — ALUM & MAG HYDROXIDE-SIMETH 200-200-20 MG/5ML PO SUSP: 30.0000 mL | ORAL | Status: DC | PRN

## 2016-09-23 MED ORDER — NICOTINE POLACRILEX 2 MG MT GUM
2.0000 mg | CHEWING_GUM | OROMUCOSAL | Status: DC | PRN
Start: 2016-09-23 — End: 2016-09-30
  Administered 2016-09-24 – 2016-09-29 (×6): 2 mg via ORAL
  Filled 2016-09-23: qty 1

## 2016-09-23 MED ORDER — HYDROXYZINE HCL 25 MG PO TABS: 25.0000 mg | ORAL_TABLET | Freq: Four times a day (QID) | ORAL | Status: DC | PRN

## 2016-09-23 MED ORDER — QUETIAPINE FUMARATE 100 MG PO TABS: 100.0000 mg | ORAL_TABLET | Freq: Every day | ORAL | Status: DC

## 2016-09-23 MED ORDER — GABAPENTIN 300 MG PO CAPS
300.0000 mg | ORAL_CAPSULE | Freq: Three times a day (TID) | ORAL | Status: DC
  Administered 2016-09-24 – 2016-09-30 (×20): 300 mg via ORAL
  Filled 2016-09-23 (×22): qty 1

## 2016-09-23 MED ORDER — ACETAMINOPHEN 325 MG PO TABS
650.0000 mg | ORAL_TABLET | Freq: Four times a day (QID) | ORAL | Status: DC | PRN
  Administered 2016-09-23: 650 mg via ORAL
  Filled 2016-09-23: qty 2

## 2016-09-23 MED ORDER — TRAZODONE HCL 100 MG PO TABS
100.0000 mg | ORAL_TABLET | Freq: Every evening | ORAL | Status: DC | PRN
  Administered 2016-09-23 – 2016-09-28 (×5): 100 mg via ORAL
  Filled 2016-09-23: qty 7
  Filled 2016-09-23 (×5): qty 1

## 2016-09-23 MED ORDER — QUETIAPINE FUMARATE 100 MG PO TABS
100.0000 mg | ORAL_TABLET | Freq: Every day | ORAL | Status: DC
  Administered 2016-09-23 – 2016-09-26 (×4): 100 mg via ORAL
  Filled 2016-09-23 (×6): qty 1

## 2016-09-23 MED ORDER — LEVOTHYROXINE SODIUM 100 MCG PO TABS: 100.0000 ug | ORAL_TABLET | Freq: Every day | ORAL | Status: DC

## 2016-09-23 MED ORDER — HYDROXYZINE HCL 25 MG PO TABS
25.0000 mg | ORAL_TABLET | Freq: Four times a day (QID) | ORAL | Status: DC | PRN
  Administered 2016-09-24 – 2016-09-29 (×10): 25 mg via ORAL
  Filled 2016-09-23 (×8): qty 1
  Filled 2016-09-23: qty 10
  Filled 2016-09-23 (×2): qty 1

## 2016-09-23 MED ORDER — MAGNESIUM HYDROXIDE 400 MG/5ML PO SUSP: 30.0000 mL | Freq: Every day | ORAL | Status: DC | PRN

## 2016-09-23 NOTE — ED Provider Notes (Addendum)
[facility] DEPT Provider Note   CSN: [id number] Arrival date & time: 09/23/16  0217     History   Chief Complaint Chief Complaint  Patient presents with  . Suicidal  . Hallucinations    HPI Johnathan Baker is a 45 y.o. male.  HPI  45 year old male with a history of major depressive disorder who has been noncompliant with his medicine due to reported financial reasons. He reports that he's had worsening depression for several months and is having some intermittent suicidal ideations. Patient also endorsing significant anger and endorsing feelings of aggression. Also reports some auditory hallucinations. Patient endorses bilateral knee pain for several months. Also endorses lower back pain. No bowel or bladder incontinence. No gait instability. Denies IV drug use.  Past Medical History:  Diagnosis Date  . Back pain   . Depression   . Hypertension   . Thyroid disease     Patient Active Problem List   Diagnosis Date Noted  . Cocaine abuse 12/21/2015  . Major depressive disorder, recurrent episode ([place]) 11/08/2015  . Alcohol dependence with uncomplicated withdrawal ([place])   . Thyroid activity decreased   . Cocaine dependence with cocaine-induced mood disorder ([place]) 10/03/2015  . Alcohol dependence ([place]) 10/03/2015  . Hypothyroidism 10/03/2015    History reviewed. No pertinent surgical history.     Home Medications    Prior to Admission medications   Medication Sig Start Date End Date Taking? Authorizing Provider  cyclobenzaprine (FLEXERIL) 10 MG tablet Take 1 tablet (10 mg total) by mouth 3 (three) times daily. Patient taking differently: Take 10 mg by mouth 3 (three) times daily as needed for muscle spasms.  11/12/15  Yes Benjamine Mola, FNP  gabapentin (NEURONTIN) 300 MG capsule Take 1 capsule (300 mg total) by mouth 3 (three) times daily. 07/17/16  Yes Patrecia Pour, NP  hydrOXYzine (ATARAX/VISTARIL) 25 MG tablet Take 1 tablet (25 mg total) by mouth every 6 (six)  hours as needed for anxiety. 11/12/15  Yes Benjamine Mola, FNP  levothyroxine (SYNTHROID, LEVOTHROID) 100 MCG tablet Take 1 tablet (100 mcg total) by mouth daily before breakfast. 07/17/16  Yes Patrecia Pour, NP  QUEtiapine (SEROQUEL) 100 MG tablet Take 1 tablet (100 mg total) by mouth at bedtime. 07/17/16  Yes Patrecia Pour, NP  traZODone (DESYREL) 100 MG tablet Take 1 tablet (100 mg total) by mouth at bedtime as needed for sleep. 07/17/16  Yes Patrecia Pour, NP  gabapentin (NEURONTIN) 300 MG capsule Take 1 capsule (300 mg total) by mouth 3 (three) times daily. Patient not taking: Reported on 09/23/2016 11/12/15   Benjamine Mola, FNP  hydrOXYzine (ATARAX/VISTARIL) 25 MG tablet Take 1 tablet (25 mg total) by mouth every 6 (six) hours as needed for anxiety. Patient not taking: Reported on 09/23/2016 07/17/16   Patrecia Pour, NP  levothyroxine (SYNTHROID, LEVOTHROID) 100 MCG tablet Take 1 tablet (100 mcg total) by mouth daily before breakfast. Patient not taking: Reported on 09/23/2016 11/12/15   Benjamine Mola, FNP  nicotine polacrilex (NICORETTE) 2 MG gum Take 1 each (2 mg total) by mouth as needed for smoking cessation. Patient not taking: Reported on 11/13/2015 11/12/15   Benjamine Mola, FNP  QUEtiapine (SEROQUEL) 100 MG tablet Take 1 tablet (100 mg total) by mouth at bedtime. Patient not taking: Reported on 09/23/2016 11/12/15   Benjamine Mola, FNP  traZODone (DESYREL) 100 MG tablet Take 1 tablet (100 mg total) by mouth at bedtime as needed for sleep. Patient  not taking: Reported on 09/23/2016 11/12/15   Benjamine Mola, FNP    Family History No family history on file.  Social History Social History  Substance Use Topics  . Smoking status: Current Every Day Smoker    Packs/day: 0.50    Types: Cigarettes  . Smokeless tobacco: Never Used  . Alcohol use Yes     Comment: binge drinks     Allergies   Patient has no known allergies.   Review of Systems Review of Systems Ten systems are reviewed  and are negative for acute change except as noted in the HPI   Physical Exam Updated Vital Signs BP 122/79 (BP Location: Right Arm)   Pulse 77   Temp 98.1 F (36.7 C) (Oral)   Resp 16   Ht 6\' 5"  (1.956 m)   Wt 230 lb (104.3 kg)   SpO2 99%   BMI 27.27 kg/m   Physical Exam  Constitutional: He is oriented to person, place, and time. He appears well-developed and well-nourished. No distress.  HENT:  Head: Normocephalic and atraumatic.  Nose: Nose normal.  Eyes: Conjunctivae and EOM are normal. Pupils are equal, round, and reactive to light. Right eye exhibits no discharge. Left eye exhibits no discharge. No scleral icterus.  Neck: Normal range of motion. Neck supple.  Cardiovascular: Normal rate and regular rhythm.  Exam reveals no gallop and no friction rub.   No murmur heard. Pulmonary/Chest: Effort normal and breath sounds normal. No stridor. No respiratory distress. He has no rales.  Abdominal: Soft. He exhibits no distension. There is no tenderness.  Musculoskeletal: He exhibits no edema.       Right knee: He exhibits normal range of motion, no swelling, no effusion, no ecchymosis, no deformity and no erythema. Tenderness found.       Left knee: He exhibits normal range of motion, no swelling, no effusion, no ecchymosis, no deformity and no erythema. Tenderness found.       Lumbar back: He exhibits tenderness. He exhibits no bony tenderness.       Back:  Neurological: He is alert and oriented to person, place, and time.  Spine Exam:  Strength: 5/5 throughout LE bilaterally (hip flexion/extension, adduction/abduction; knee flexion/extension; foot dorsiflexion/plantarflexion, inversion/eversion; great toe inversion) Sensation: Intact to light touch in proximal and distal LE bilaterally Reflexes: 2+ quadriceps and achilles reflexes    Skin: Skin is warm and dry. No rash noted. He is not diaphoretic. No erythema.  Psychiatric: He has a normal mood and affect.  Vitals  reviewed.    ED Treatments / Results  Labs (all labs ordered are listed, but only abnormal results are displayed) Labs Reviewed  COMPREHENSIVE METABOLIC PANEL - Abnormal; Notable for the following:       Result Value   Creatinine, Ser 1.30 (*)    Total Protein 8.4 (*)    All other components within normal limits  ACETAMINOPHEN LEVEL - Abnormal; Notable for the following:    Acetaminophen (Tylenol), Serum <10 (*)    All other components within normal limits  CBC - Abnormal; Notable for the following:    RBC 4.09 (*)    MCV 102.7 (*)    MCH 34.2 (*)    All other components within normal limits  RAPID URINE DRUG SCREEN, HOSP PERFORMED - Abnormal; Notable for the following:    Cocaine POSITIVE (*)    Tetrahydrocannabinol POSITIVE (*)    All other components within normal limits  ETHANOL  SALICYLATE LEVEL  EKG  EKG Interpretation None       Radiology No results found.  Procedures Procedures (including critical care time)  Medications Ordered in ED Medications  levothyroxine (SYNTHROID, LEVOTHROID) tablet 100 mcg (not administered)  QUEtiapine (SEROQUEL) tablet 100 mg (not administered)  traZODone (DESYREL) tablet 100 mg (not administered)  hydrOXYzine (ATARAX/VISTARIL) tablet 25 mg (not administered)  gabapentin (NEURONTIN) capsule 300 mg (not administered)     Initial Impression / Assessment and Plan / ED Course  I have reviewed the triage vital signs and the nursing notes.  Pertinent labs & imaging results that were available during my care of the patient were reviewed by me and considered in my medical decision making (see chart for details).  Clinical Course    45 y.o. male presents with back pain in lumbar area  without signs of radicular pain. No acute traumatic onset. No red flag symptoms of fever, weight loss, saddle anesthesia, weakness, fecal/urinary incontinence or urinary retention.   Suspect MSK etiology. No indication for imaging emergently.  Patient was recommended to take short course of scheduled NSAIDs and engage in early mobility as definitive treatment. Return precautions discussed for worsening or new concerning symptoms.   High risk patient. Screening labs obtained. Medically cleared for behavioral health evaluation. Behavior health consulted for further assessment.  Final Clinical Impressions(s) / ED Diagnoses   Final diagnoses:  Suicidal ideation  Episode of recurrent major depressive disorder, unspecified depression episode severity [facility])  Auditory hallucination       Fatima Blank, MD 09/23/16 1613    Fatima Blank, MD 10/08/16 [phone]

## 2016-09-23 NOTE — ED Notes (Signed)
Called [facility] to give report and was told RN off the floor.

## 2016-09-23 NOTE — ED Notes (Signed)
Patient noted in room. No complaints, stable, in no acute distress. Q15 minute rounds and monitoring via [employer] to continue. Snack and beverage given.
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
## 2016-09-23 NOTE — ED Notes (Signed)
Report to include Situation, Background, Assessment, and Recommendations received from [place]. Patient alert and oriented, warm and dry, in no acute distress. Patient denies SI, AVH and pain. Patient states he still thinks about hurting people. Patient made aware of Q15 minute rounds and security cameras for their safety. Patient instructed to come to me with needs or concerns.

## 2016-09-23 NOTE — ED Triage Notes (Signed)
Per patient, he has a mental health hx and has not be taking his medications due to being unable to get them, however, also states him not having the the motivation to get them.  Endorses visual hallucinations and SI without a plan.  States he has been depressed for a while and has several thoughts going "crazy" in his head.

## 2016-09-23 NOTE — Progress Notes (Signed)
09/23/16 1337:  LRT went to pt room, pt was sleep.  [name], LRT/CTRS

## 2016-09-23 NOTE — ED Notes (Signed)
Bed: WTR5 Expected date:  Expected time:  Means of arrival:  Comments: 

## 2016-09-23 NOTE — ED Triage Notes (Signed)
Pt comes in by police from [place] with complaints of depression.  Per police Beverly Sessions stated they were short staffed and the nurse was unable to leave to get his paperwork due to this.

## 2016-09-23 NOTE — ED Notes (Signed)
Pt oriented to room and unit.  Pt appears very sad and withdrawn.  Pt does contract for safety.  He denies AVH at this time but c/o racing thoughts.  15 minute checks and video monitoring in place.

## 2016-09-23 NOTE — Progress Notes (Signed)
D: Pt. is up and visible in the milieu, eating a snack and watching TV. Denies having any SI/HI/AVH at this time. Rates pain 5/10, R. Leg/Knee. Pt. presents with a depressed affect and mood.   A: Encouragement and support given. Meds. ordered and given. PRN Tylenol & trazodone requested and given. Will re-eval as necessary.   R: 1:1 interaction in private. Safety maintained with Q 15 checks. Continues to follow treatment plan and will monitor closely. No additional questions/concerns at this time.

## 2016-09-23 NOTE — BH Assessment (Signed)
[place] Assessment Progress Note  Per Waylan Boga, DNP, this pt requires psychiatric hospitalization at this time.  Letitia Libra, RN, [facility] has assigned pt to [facility] Rm 306-2; they will be ready to receive pt at 20:00.  Pt has signed Voluntary Admission and Consent for Treatment, as well as Consent to Release Information to no one, and signed forms have been faxed to [facility].  Pt's nurse, Nena Jordan, has been notified, and agrees to send original paperwork along with pt via [place]y Pries, and to call report to [phone].  Jalene Mullet, [place] Triage Specialist [phone]

## 2016-09-23 NOTE — ED Notes (Signed)
Pt transferred to [place] [facility] and belonging placed in appropriate cabinet by nursing station near [place] [facility] by Raeanne Amit verbalizes 3 pt belongings bags.

## 2016-09-23 NOTE — Progress Notes (Signed)
Report received from Dominica Severin, [place]. Pending transport.

## 2016-09-23 NOTE — Tx Team (Signed)
Initial Treatment Plan 09/23/2016 10:58 PM Johnathan Baker [id number]    PATIENT STRESSORS: Financial difficulties Health problems Marital or family conflict Medication change or noncompliance Substance abuse   PATIENT STRENGTHS: Ability for insight Active sense of humor Communication skills General fund of knowledge Motivation for treatment/growth Special hobby/interest Supportive family/friends Work skills   PATIENT IDENTIFIED PROBLEMS: Depression  At risk for suicide   Substance-abuse   " Trying to get my mental state on track"   "Don't want to think about things that bring me down"              DISCHARGE CRITERIA:  Ability to meet basic life and health needs Adequate post-discharge living arrangements Improved stabilization in mood, thinking, and/or behavior Motivation to continue treatment in a less acute level of care Need for constant or close observation no longer present Reduction of life-threatening or endangering symptoms to within safe limits Verbal commitment to aftercare and medication compliance Withdrawal symptoms are absent or subacute and managed without 24-hour nursing intervention  PRELIMINARY DISCHARGE PLAN: Attend aftercare/continuing care group Attend PHP/IOP Attend 12-step recovery group Placement in alternative living arrangements  PATIENT/FAMILY INVOLVEMENT: This treatment plan has been presented to and reviewed with the patient, [name]. The patient and family have been given the opportunity to ask questions and make suggestions.  Lonia Skinner, RN 09/23/2016, 10:58 PM

## 2016-09-23 NOTE — ED Notes (Signed)
Pt given crackers, peanut butter, and sprite 

## 2016-09-23 NOTE — Progress Notes (Signed)
Admission Note:   45 yr male who presents VOL in no acute distress for the treatment of SI, Substance Use, and Depression. Pt appears flat and depressed. Pt was calm and cooperative with admission process. Pt denies SI/HI upon admission. Pt denies AVH. 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 Pt. states he use cocaine and marijuana when "I start thinking about things that bring me down, I use it to cope". Pt. is currently homeless. Pt. was last D/C from [facility] on 07/17/16 and reported that he did not follow up with aftercare. Pt. was recently married but wife will not allowed Pt. back to apartment due to continued SA use. Pt. identify sister as his support person. Pt. goals for duration of stay is to work on "trying to get my mental state on track" and "talking to people and letting them know what I need". Skin was assessed and found to be clear of any abnormal marks apart from a small tattoo on L. Upper Chest. PT searched and no contraband found, POC and unit policies explained and understanding verbalized. Consents obtained. Snacks and fluids offered, and both accepted. Pt had no additional questions or concerns. Belongings in locker # 28.

## 2016-09-23 NOTE — Plan of Care (Signed)
Problem: Safety: Goal: Periods of time without injury will increase Outcome: Progressing Pt. denies SI/HI at this time, remains a low fall risk, Q 15 checks in place.

## 2016-09-24 DIAGNOSIS — F17210 Nicotine dependence, cigarettes, uncomplicated: Secondary | ICD-10-CM

## 2016-09-24 NOTE — BHH Suicide Risk Assessment (Signed)
[facility] INPATIENT:  Family/Significant Other Suicide Prevention Education  Suicide Prevention Education:  Patient Refusal for Family/Significant Other Suicide Prevention Education: The patient Johnathan Baker has refused to provide written consent for family/significant other to be provided Family/Significant Other Suicide Prevention Education during admission and/or prior to discharge.  Physician notified.  SPE was provided to patient, and brochure was left with him.  Berlin Hun Grossman-Orr 09/24/2016, 2:45 PM

## 2016-09-24 NOTE — Progress Notes (Signed)
[facility] MD Progress Note  09/24/2016 2:18 PM Johnathan Baker  MRN:  [id number] Subjective:  Patient reports " I am feeling okay, I just woke-up."  Objective:Johnathan Baker is awake, alert and oriented *3. Seen resting in bedroom. Denies suicidal or homicidal ideation. Denies auditory or visual hallucination and does not appear to be responding to internal stimuli.  Patient reports he is medication compliant without mediation side effects. Patient appears flat and guarded. States his depression 2/10. patient reports a good appetite. reports resting well. Patient reports he is unsure of his current discharge plan. Support, encouragement and reassurance was provided.   Principal Problem: MDD (major depressive disorder), recurrent severe, without psychosis ([place]) Diagnosis:   Patient Active Problem List   Diagnosis Date Noted  . Major depressive disorder, recurrent severe without psychotic features ([place]) [F33.2] 09/23/2016  . MDD (major depressive disorder), recurrent severe, without psychosis ([place]) [F33.2] 09/23/2016  . Cocaine abuse [F14.10] 12/21/2015  . Major depressive disorder, recurrent episode ([place]) [F33.9] 11/08/2015  . Alcohol dependence with uncomplicated withdrawal ([place]) [F10.230]   . Thyroid activity decreased [E03.9]   . Cocaine dependence with cocaine-induced mood disorder ([place]) [F14.24] 10/03/2015  . Alcohol dependence ([place]) [F10.20] 10/03/2015  . Hypothyroidism [E03.9] 10/03/2015   Total Time spent with patient: 30 minutes  Past Psychiatric History:   Past Medical History:  Past Medical History:  Diagnosis Date  . Back pain   . Depression   . Hypertension   . Thyroid disease    History reviewed. No pertinent surgical history. Family History: History reviewed. No pertinent family history. Family Psychiatric  History:  Social History:  History  Alcohol Use  . Yes    Comment: binge drinks     History  Drug Use  . Types: Cocaine, Marijuana    Social History   Social  History  . Marital status: Married    Spouse name: N/A  . Number of children: N/A  . Years of education: N/A   Social History Main Topics  . Smoking status: Current Every Day Smoker    Packs/day: 0.50    Types: Cigarettes  . Smokeless tobacco: Never Used  . Alcohol use Yes     Comment: binge drinks  . Drug use:     Types: Cocaine, Marijuana  . Sexual activity: Yes   Other Topics Concern  . None   Social History Narrative  . None   Additional Social History:                         Sleep: Fair  Appetite:  Fair  Current Medications: Current Facility-Administered Medications  Medication Dose Route Frequency Provider Last Rate Last Dose  . alum & mag hydroxide-simeth (MAALOX/MYLANTA) 200-200-20 MG/5ML suspension 30 mL  30 mL Oral Q4H PRN Patrecia Pour, NP      . gabapentin (NEURONTIN) capsule 300 mg  300 mg Oral TID Patrecia Pour, NP   300 mg at 09/24/16 1127  . hydrOXYzine (ATARAX/VISTARIL) tablet 25 mg  25 mg Oral Q6H PRN Patrecia Pour, NP   25 mg at 09/24/16 6789  . ibuprofen (ADVIL,MOTRIN) tablet 800 mg  800 mg Oral TID PRN Rozetta Nunnery, NP      . levothyroxine (SYNTHROID, LEVOTHROID) tablet 100 mcg  100 mcg Oral QAC breakfast Patrecia Pour, NP   100 mcg at 09/24/16 0615  . magnesium hydroxide (MILK OF MAGNESIA) suspension 30 mL  30 mL Oral Daily PRN Patrecia Pour, NP      .  nicotine polacrilex (NICORETTE) gum 2 mg  2 mg Oral PRN Linard Millers, MD   2 mg at 09/24/16 5597  . QUEtiapine (SEROQUEL) tablet 100 mg  100 mg Oral QHS Patrecia Pour, NP   100 mg at 09/23/16 2225  . traZODone (DESYREL) tablet 100 mg  100 mg Oral QHS PRN Patrecia Pour, NP   100 mg at 09/23/16 2225    Lab Results:  Results for orders placed or performed during the hospital encounter of 09/23/16 (from the past 48 hour(s))  Comprehensive metabolic panel     Status: Abnormal   Collection Time: 09/23/16  2:39 AM  Result Value Ref Range   Sodium 140 135 - 145 mmol/L    Potassium 4.1 3.5 - 5.1 mmol/L   Chloride 105 101 - 111 mmol/L   CO2 26 22 - 32 mmol/L   Glucose, Bld 93 65 - 99 mg/dL   BUN 16 6 - 20 mg/dL   Creatinine, Ser 1.30 (H) 0.61 - 1.24 mg/dL   Calcium 9.8 8.9 - 10.3 mg/dL   Total Protein 8.4 (H) 6.5 - 8.1 g/dL   Albumin 4.8 3.5 - 5.0 g/dL   AST 35 15 - 41 U/L   ALT 18 17 - 63 U/L   Alkaline Phosphatase 83 38 - 126 U/L   Total Bilirubin 1.0 0.3 - 1.2 mg/dL   GFR calc non Af Amer >60 >60 mL/min   GFR calc Af Amer >60 >60 mL/min    Comment: (NOTE) The eGFR has been calculated using the CKD EPI equation. This calculation has not been validated in all clinical situations. eGFR's persistently <60 mL/min signify possible Chronic Kidney Disease.    Anion gap 9 5 - 15  Ethanol     Status: None   Collection Time: 09/23/16  2:39 AM  Result Value Ref Range   Alcohol, Ethyl (B) <5 <5 mg/dL    Comment:        LOWEST DETECTABLE LIMIT FOR SERUM ALCOHOL IS 5 mg/dL FOR MEDICAL PURPOSES ONLY   Salicylate level     Status: None   Collection Time: 09/23/16  2:39 AM  Result Value Ref Range   Salicylate Lvl <4.1 2.8 - 30.0 mg/dL  Acetaminophen level     Status: Abnormal   Collection Time: 09/23/16  2:39 AM  Result Value Ref Range   Acetaminophen (Tylenol), Serum <10 (L) 10 - 30 ug/mL    Comment:        THERAPEUTIC CONCENTRATIONS VARY SIGNIFICANTLY. A RANGE OF 10-30 ug/mL MAY BE AN EFFECTIVE CONCENTRATION FOR MANY PATIENTS. HOWEVER, SOME ARE BEST TREATED AT CONCENTRATIONS OUTSIDE THIS RANGE. ACETAMINOPHEN CONCENTRATIONS >150 ug/mL AT 4 HOURS AFTER INGESTION AND >50 ug/mL AT 12 HOURS AFTER INGESTION ARE OFTEN ASSOCIATED WITH TOXIC REACTIONS.   cbc     Status: Abnormal   Collection Time: 09/23/16  2:39 AM  Result Value Ref Range   WBC 8.7 4.0 - 10.5 K/uL   RBC 4.09 (L) 4.22 - 5.81 MIL/uL   Hemoglobin 14.0 13.0 - 17.0 g/dL   HCT 42.0 39.0 - 52.0 %   MCV 102.7 (H) 78.0 - 100.0 fL   MCH 34.2 (H) 26.0 - 34.0 pg   MCHC 33.3 30.0 - 36.0  g/dL   RDW 13.5 11.5 - 15.5 %   Platelets 304 150 - 400 K/uL  Rapid urine drug screen (hospital performed)     Status: Abnormal   Collection Time: 09/23/16  2:52 AM  Result Value Ref Range  Opiates NONE DETECTED NONE DETECTED   Cocaine POSITIVE (A) NONE DETECTED   Benzodiazepines NONE DETECTED NONE DETECTED   Amphetamines NONE DETECTED NONE DETECTED   Tetrahydrocannabinol POSITIVE (A) NONE DETECTED   Barbiturates NONE DETECTED NONE DETECTED    Comment:        DRUG SCREEN FOR MEDICAL PURPOSES ONLY.  IF CONFIRMATION IS NEEDED FOR ANY PURPOSE, NOTIFY LAB WITHIN 5 DAYS.        LOWEST DETECTABLE LIMITS FOR URINE DRUG SCREEN Drug Class       Cutoff (ng/mL) Amphetamine      1000 Barbiturate      200 Benzodiazepine   235 Tricyclics       361 Opiates          300 Cocaine          300 THC              50   TSH     Status: Abnormal   Collection Time: 09/23/16  7:29 PM  Result Value Ref Range   TSH 11.582 (H) 0.350 - 4.500 uIU/mL    Comment: Performed by a 3rd Generation assay with a functional sensitivity of <=0.01 uIU/mL.    Blood Alcohol level:  Lab Results  Component Value Date   ETH <5 09/23/2016   ETH 6 (H) 44/31/5400    Metabolic Disorder Labs: No results found for: HGBA1C, MPG No results found for: PROLACTIN Lab Results  Component Value Date   CHOL 165 02/11/2014   TRIG 80 02/11/2014   HDL 76 02/11/2014   CHOLHDL 2.2 02/11/2014   VLDL 16 02/11/2014   LDLCALC 73 02/11/2014    Physical Findings: AIMS:  , ,  ,  ,    CIWA:    COWS:     Musculoskeletal: Strength & Muscle Tone: within normal limits Gait & Station: normal Patient leans: N/A  Psychiatric Specialty Exam: Physical Exam  Nursing note and vitals reviewed. Constitutional: He is oriented to person, place, and time. He appears well-developed.  Neurological: He is alert and oriented to person, place, and time.  Psychiatric: He has a normal mood and affect. His behavior is normal.    Review of  Systems  Psychiatric/Behavioral: Positive for depression and substance abuse. Negative for suicidal ideas. The patient is nervous/anxious.     Blood pressure 126/78, pulse 66, temperature 98.7 F (37.1 C), resp. rate 15, height 6' 5"  (1.956 m), weight 100.2 kg (221 lb), SpO2 100 %.Body mass index is 26.21 kg/m.  General Appearance: Casual  Eye Contact:  Fair  Speech:  Clear and Coherent  Volume:  Normal  Mood:  Depressed  Affect:  Depressed and Flat  Thought Process:  Coherent  Orientation:  Full (Time, Place, and Person)  Thought Content:  Hallucinations: None  Suicidal Thoughts:  No  Homicidal Thoughts:  No  Memory:  Recent;   Fair Remote;   Fair  Judgement:  Fair  Insight:  Fair  Psychomotor Activity:  Restlessness  Concentration:  Concentration: Fair and Attention Span: Fair  Recall:  [employer] of Knowledge:  Fair  Language:  Fair  Akathisia:  No  Handed:  Right  AIMS (if indicated):     Assets:  Communication Skills Desire for Improvement Physical Health Resilience Social Support  ADL's:  Intact  Cognition:  WNL  Sleep:  Number of Hours: 6.75     I agree with current treatment plan on 11/182017, Patient seen face-to-face for psychiatric evaluation follow-up, chart reviewed. Reviewed the  information documented and agree with the treatment plan.  Treatment Plan Summary: Daily contact with patient to assess and evaluate symptoms and progress in treatment and Medication management   Continue with Neurontin 369m and Seroquel 100 mg  for mood stabilization. Continue with Trazodone 100 mg for insomnia  Will continue to monitor vitals ,medication compliance and treatment side effects while patient is here.  Reviewed labs  CSW will start working on disposition.  Patient to participate in therapeutic milieu  [facility] NP 09/24/2016, 2:18 PM   Agree with NP Progress Note as above
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
## 2016-09-24 NOTE — Progress Notes (Signed)
Patient did not attend the evening speaker AA meeting. Pt was notified that group was beginning but remained in bed.   

## 2016-09-24 NOTE — Progress Notes (Signed)
D    Pt stayed in his room most of the shift but did come to the medication window when prompted and to get a snack    He appears depressed and sad   He is somewhat psychomotor retarded and he is slow to answer questions A    Verbal support given   Medications administered and effectiveness monitored    Q 15 min checks R  Pt remains safe and guarded

## 2016-09-24 NOTE — BHH Group Notes (Signed)
[facility] Group Notes: (Clinical Social Work)   09/24/2016      Type of Therapy:  Group Therapy   Participation Level:  Did Not Attend despite MHT prompting   Selmer Dominion, LCSW 09/24/2016, 12:11 PM

## 2016-09-24 NOTE — BHH Group Notes (Signed)
[facility] Group Notes:  (Nursing/MHT/Case Management/Adjunct)  Date:  09/24/2016  Time:  2:31 PM  Type of Therapy:  Psychoeducational Skills  Participation Level:  Active  Participation Quality:  Appropriate  Affect:  Appropriate  Cognitive:  Appropriate  Insight:  Appropriate  Engagement in Group:  Engaged  Modes of Intervention:  Discussion  Summary of Progress/Problems: Pt did attend life skills group with the nurse.   Johnathan Baker 09/24/2016, 2:31 PM

## 2016-09-24 NOTE — BHH Group Notes (Signed)
[facility] Group Notes:  (Nursing/MHT/Case Management/Adjunct)  Date:  09/24/2016  Time:  10:21 AM  Type of Therapy:  Psychoeducational Skills  Participation Level:  Did Not Attend  Participation Quality:  Did Not Attend  Affect:  Did Not Attend  Cognitive:  Did Not Attend  Insight:  None  Engagement in Group:  Did Not Attend  Modes of Intervention:  Did Not Attend  Summary of Progress/Problems: Pt did not attend patient self inventory group.   Benancio Deeds Shanta 09/24/2016, 10:21 AM

## 2016-09-24 NOTE — Progress Notes (Signed)
D Johnathan Baker is seen sitting in the 300 hall dayroom this morning. HE is sitting in a chair, his body is completely covered by a bed blanket, which he has draped over his head, such that you can only see his eyes. HE is quiet, mumbles answers when he speaks and takes his am medications without incident. A HE completes his daily assessment and on it he wrote he HAS experienced SI today. WHen this nurse asked him about it, he said " yeah..I'm ok". He is flat, depressed and quiet. Reserved and keeps to himself. He asks this nurse to wash his clothes and this is done. R Safety in place.

## 2016-09-25 NOTE — BHH Group Notes (Signed)
[place] Group Notes:  (Nursing/MHT/Case Management/Adjunct)  Date:  09/25/2016  Time:  9:19 AM  Type of Therapy:  Psychoeducational Skills  Participation Level:  Active  Participation Quality:  Appropriate  Affect:  Appropriate  Cognitive:  Appropriate  Insight:  Appropriate  Engagement in Group:  Engaged  Modes of Intervention:  Discussion  Summary of Progress/Problems: Pt did attend self inventory group.     Benancio Deeds Shanta 09/25/2016, 9:19 AM

## 2016-09-25 NOTE — Progress Notes (Signed)
Pt reports he is doing better this evening.  He presents flat and depressed.  He denies SI/HI/AVH to this Probation officer.  Conversation was minimal.  He did state that he was recently dx with arthritis in his knees and wanted something for pain.  Upon checking his MAR, pt had received Ibuprofen 800 mg just before shift change this evening.  Pt was informed that he could not get more Ibuprofen so soon, but if he was still up at 2300, he could get another 800 mg.  Pt voiced understanding.  At the time he took his scheduled Seroquel, pt was given Trazodone 100 mg.  Pt was pleasant with Probation officer, but again, not much engaged in conversation.  Support and encouragement offered.  Pt was encouraged to make his needs known to staff.  Pt would like to go to a work program after he is discharged from [facility].  Discharge plans are in process.  Safety maintained with q15 minute checks.

## 2016-09-25 NOTE — Progress Notes (Signed)
Patient did attend the evening speaker AA meeting.  

## 2016-09-25 NOTE — Progress Notes (Signed)
[facility] MD Progress Note  09/25/2016 4:08 PM Johnathan Baker  MRN:  [id number] Subjective:  Patient reports " I am feeling okay today, I have learned a lot from group session."  Objective:Redell Hoar is awake, alert and oriented *3. Seen resting in bedroom. Denies suicidal or homicidal ideation. Denies auditory or visual hallucination and does not appear to be responding to internal stimuli.  Patient reports he is medication compliant without mediation side effects. Patient appears alert and pleasant today. Patient was engaging and eager to discuss what learned in group session. Patient appeared to have good insight on his plans moving forward.  States his depression 2/10. patient reports a good appetite. reports resting well. Patient reports he is unsure of his current discharge plans and is interested in going to [place], [place] or [place] . Support, encouragement and reassurance was provided.   Principal Problem: MDD (major depressive disorder), recurrent severe, without psychosis ([place]) Diagnosis:   Patient Active Problem List   Diagnosis Date Noted  . Major depressive disorder, recurrent severe without psychotic features ([place]) [F33.2] 09/23/2016  . MDD (major depressive disorder), recurrent severe, without psychosis ([place]) [F33.2] 09/23/2016  . Cocaine abuse [F14.10] 12/21/2015  . Major depressive disorder, recurrent episode ([place]) [F33.9] 11/08/2015  . Alcohol dependence with uncomplicated withdrawal ([place]) [F10.230]   . Thyroid activity decreased [E03.9]   . Cocaine dependence with cocaine-induced mood disorder ([place]) [F14.24] 10/03/2015  . Alcohol dependence ([place]) [F10.20] 10/03/2015  . Hypothyroidism [E03.9] 10/03/2015   Total Time spent with patient: 30 minutes  Past Psychiatric History:   Past Medical History:  Past Medical History:  Diagnosis Date  . Back pain   . Depression   . Hypertension   . Thyroid disease    History reviewed. No pertinent surgical history. Family History:  History reviewed. No pertinent family history. Family Psychiatric  History:  Social History:  History  Alcohol Use  . Yes    Comment: binge drinks     History  Drug Use  . Types: Cocaine, Marijuana    Social History   Social History  . Marital status: Married    Spouse name: N/A  . Number of children: N/A  . Years of education: N/A   Social History Main Topics  . Smoking status: Current Every Day Smoker    Packs/day: 0.50    Types: Cigarettes  . Smokeless tobacco: Never Used  . Alcohol use Yes     Comment: binge drinks  . Drug use:     Types: Cocaine, Marijuana  . Sexual activity: Yes   Other Topics Concern  . None   Social History Narrative  . None   Additional Social History:                         Sleep: Fair  Appetite:  Fair  Current Medications: Current Facility-Administered Medications  Medication Dose Route Frequency Provider Last Rate Last Dose  . alum & mag hydroxide-simeth (MAALOX/MYLANTA) 200-200-20 MG/5ML suspension 30 mL  30 mL Oral Q4H PRN Patrecia Pour, NP      . gabapentin (NEURONTIN) capsule 300 mg  300 mg Oral TID Patrecia Pour, NP   300 mg at 09/25/16 1122  . hydrOXYzine (ATARAX/VISTARIL) tablet 25 mg  25 mg Oral Q6H PRN Patrecia Pour, NP   25 mg at 09/24/16 1620  . ibuprofen (ADVIL,MOTRIN) tablet 800 mg  800 mg Oral TID PRN Rozetta Nunnery, NP      . levothyroxine (  SYNTHROID, LEVOTHROID) tablet 100 mcg  100 mcg Oral QAC breakfast Patrecia Pour, NP   100 mcg at 09/25/16 [id number]  . magnesium hydroxide (MILK OF MAGNESIA) suspension 30 mL  30 mL Oral Daily PRN Patrecia Pour, NP      . nicotine polacrilex (NICORETTE) gum 2 mg  2 mg Oral PRN Linard Millers, MD   2 mg at 09/24/16 1620  . QUEtiapine (SEROQUEL) tablet 100 mg  100 mg Oral QHS Patrecia Pour, NP   100 mg at 09/24/16 2235  . traZODone (DESYREL) tablet 100 mg  100 mg Oral QHS PRN Patrecia Pour, NP   100 mg at 09/23/16 2225    Lab Results:  Results for orders placed  or performed during the hospital encounter of 09/23/16 (from the past 48 hour(s))  TSH     Status: Abnormal   Collection Time: 09/23/16  7:29 PM  Result Value Ref Range   TSH 11.582 (H) 0.350 - 4.500 uIU/mL    Comment: Performed by a 3rd Generation assay with a functional sensitivity of <=0.01 uIU/mL.    Blood Alcohol level:  Lab Results  Component Value Date   ETH <5 09/23/2016   ETH 6 (H) 123XX123    Metabolic Disorder Labs: No results found for: HGBA1C, MPG No results found for: PROLACTIN Lab Results  Component Value Date   CHOL 165 02/11/2014   TRIG 80 02/11/2014   HDL 76 02/11/2014   CHOLHDL 2.2 02/11/2014   VLDL 16 02/11/2014   LDLCALC 73 02/11/2014    Physical Findings: AIMS:  , ,  ,  ,    CIWA:    COWS:     Musculoskeletal: Strength & Muscle Tone: within normal limits Gait & Station: normal Patient leans: N/A  Psychiatric Specialty Exam: Physical Exam  Nursing note and vitals reviewed. Constitutional: He is oriented to person, place, and time. He appears well-developed.  Neurological: He is alert and oriented to person, place, and time.  Psychiatric: He has a normal mood and affect. His behavior is normal.    Review of Systems  Psychiatric/Behavioral: Positive for depression and substance abuse. Negative for suicidal ideas. The patient is nervous/anxious.     Blood pressure 133/86, pulse 64, temperature 98.4 F (36.9 C), resp. rate 18, height 6\' 5"  (1.956 m), weight 100.2 kg (221 lb), SpO2 100 %.Body mass index is 26.21 kg/m.  General Appearance: Casual  Eye Contact:  Fair  Speech:  Clear and Coherent  Volume:  Normal  Mood:  Depressed  Affect:  Depressed and Flat  Thought Process:  Coherent  Orientation:  Full (Time, Place, and Person)  Thought Content:  Hallucinations: None  Suicidal Thoughts:  No  Homicidal Thoughts:  No  Memory:  Recent;   Fair Remote;   Fair  Judgement:  Fair  Insight:  Fair  Psychomotor Activity:  Restlessness   Concentration:  Concentration: Fair and Attention Span: Fair  Recall:  [employer] of Knowledge:  Fair  Language:  Fair  Akathisia:  No  Handed:  Right  AIMS (if indicated):     Assets:  Communication Skills Desire for Improvement Physical Health Resilience Social Support  ADL's:  Intact  Cognition:  WNL  Sleep:  Number of Hours: 4.75     I agree with current treatment plan on 09/25/2016, Patient seen face-to-face for psychiatric evaluation follow-up, chart reviewed. Reviewed the information documented and agree with the treatment plan.  Treatment Plan Summary: Daily contact with patient to  assess and evaluate symptoms and progress in treatment and Medication management   Continue with Neurontin 300mg  and Seroquel 100 mg  for mood stabilization. Continue with Trazodone 100 mg for insomnia  Will continue to monitor vitals ,medication compliance and treatment side effects while patient is here.  Reviewed labs  CSW will start working on disposition.  Patient to participate in therapeutic milieu  Derrill Center, NP 09/25/2016, 4:08 PM   Agree with NP Progress Note as above

## 2016-09-25 NOTE — BHH Group Notes (Signed)
Adult Therapy Group Note  Date: 09/25/2016  Time:  10:00-11:00AM  Group Topic/Focus: Healthy Engineer, manufacturing Esteem:   The focus of this group was to assist patients in identifying their current healthy supports and unhealthy supports, then discussing how to add additional healthy supports and reduce existing unhealthy ones.  The healthy additions included supports such as 12-step groups, individual therapy, psychiatrists, faith activities,  sponsors, group therapy, support groups, classes on mental health, and more.    Participation Level:  Active  Participation Quality:  Attentive and Sharing  Affect:  Depressed  Cognitive:  Alert and Appropriate  Insight: Good  Engagement in Group:  Engaged  Modes of Intervention:  Discussion and Support  Additional Comments:  The patient expressed that a current health support is not something that he is able to identify in his life right now.  He stated he has one unhealthy support, did not want to talk about it.  He said that if someone is unhealthy in his life, he is going to cut them out and has done so.  He is not sure what types of healthy supports will be helpful to him at discharge, but is willing to think about it and work on it.  Maretta Los, LCSW 09/25/2016  12:53 PM

## 2016-09-25 NOTE — Progress Notes (Signed)
D Esai is OOB UAL on the 300 hall this am..he actually smiles broadly when this Probation officer and another nurse compliment him on how good...how "upbeat" he looks this morning. A HE takes his scheduled meds as ordered and he completed his daily assessment and on it he wrote he deneid SI today and he rated his  depression, hopelessness and anxiety " 6/6/6/", respectively. He is still quiet, reserved and avoiding staff but he attends his Life SKills group, is attentive during the discussion and is observed doing his homework in his journal after the group ends. R Safety in place and poc cotn.

## 2016-09-26 DIAGNOSIS — F332 Major depressive disorder, recurrent severe without psychotic features: Principal | ICD-10-CM

## 2016-09-26 DIAGNOSIS — Z79899 Other long term (current) drug therapy: Secondary | ICD-10-CM

## 2016-09-26 NOTE — Progress Notes (Signed)
D:  Patient's self inventory sheet, patient has fair sleep, sleep medication is helpful.  Good appetite, low energy level, poor concentration.  Rated depression and hopeless 6, anxiety 5.  Denied withdrawals.  SI and HI if discharged.  Feels safe at [facility].  Physical pain, bottom of feet, R knee, back, worst pain in past 24 hours is #5, pain medication is helpful.  Goal is to not be discouraged.  "I feel safe here with staff and the resident's I'm not ready for society."  Needs place to stay and get help.  Not going to fool himself any more. A:  Medications administered per MD orders.  Emotional support and encouragement given patient. R:  SI and HI if discharged, contracts for safety.  Denied A/V hallucinations.  Safety maintained with 15 minute checks.

## 2016-09-26 NOTE — Progress Notes (Signed)
Nursing Progress Note 7P-7A  D) Patient presents pleasant and cooperative. Patient reports having a good day. Patient denies SI/HI/AVH or pain, but reports his SI "comes and goes". Patient reports he has a plan to discharge to [facility] rehab and plans to call them for an interview tomorrow. Patient contracts for safety at this time. Patient requests medicine to help him sleep.  A) PRN trazodone given as prescribed. Emotional support and encouragement given. Patient on Q 50min safety checks. Opportunities for questions or concerns presented to patient. Patient encouraged to continue to work on his discharge plans.   R) Patient sleeping without issue. Patient remains safe on the unit at this time. Will continue to monitor.

## 2016-09-26 NOTE — Plan of Care (Signed)
Problem: Activity: Goal: Interest or engagement in leisure activities will improve Outcome: Progressing Nurse discussed depression/coping skills with patient.

## 2016-09-26 NOTE — BHH Group Notes (Signed)
Pt attended spiritual care group on grief and loss facilitated by chaplain Jerene Pitch   Group opened with brief discussion and psycho-social ed around grief and loss in relationships and in relation to self - identifying life patterns, circumstances, changes that cause losses. Established group norm of speaking from own life experience. Group goal of establishing open and affirming space for members to share loss and experience with grief, normalize grief experience and provide psycho social education and grief support.   Rihaan entered group about half way through group time.  He was alert and attentive to other group members with appropriate body language.  He did not engage in group discussion verbally.  He responded to facilitator when introduced after group time .      [place], [place]

## 2016-09-26 NOTE — BHH Group Notes (Signed)
[place] LCSW Group Therapy 09/26/2016  1:15 pm  Type of Therapy: Group Therapy Participation Level: Active  Participation Quality: Attentive, Sharing and Supportive  Affect: Blunted  Cognitive: Alert and Oriented  Insight: Developing/Improving and Engaged  Engagement in Therapy: Developing/Improving and Engaged  Modes of Intervention: Clarification, Confrontation, Discussion, Education, Exploration,  Limit-setting, Orientation, Problem-solving, Rapport Building, Art therapist, Socialization and Support  Summary of Progress/Problems: Pt identified obstacles faced currently and processed barriers involved in overcoming these obstacles. Pt identified steps necessary for overcoming these obstacles and explored motivation (internal and external) for facing these difficulties head on. Pt further identified one area of concern in their lives and chose a goal to focus on for today. Patient shared that he has had to overcome many obstacles in his life including homelessness and relationship issues. He states that he has learned to accept his decisions to better himself, even if he does not feel supported by his wife.   Tilden Fossa, LCSW Clinical Social Worker [facility] [phone]

## 2016-09-26 NOTE — Progress Notes (Signed)
EKG completed and given to MD for review.  

## 2016-09-26 NOTE — Progress Notes (Signed)
Recreation Therapy Notes  Date: 09/26/16 Time: 0930 Location: [address] Hall Dayroom  Group Topic: Stress Management  Goal Area(s) Addresses:  Patient will verbalize importance of using healthy stress management.  Patient will identify positive emotions associated with healthy stress management.   Intervention: Calm App  Activity :  Managing Stress Meditation.  LRT introduced the stress management technique of stress management.  LRT played a recorded meditation on stress and how to deal with it.  Patients were to follow along with the recording and focus on their breathing and concentration to participate in the technique.  Education:  Stress Management, Discharge Planning.   Education Outcome: Acknowledges edcuation/In group clarification offered/Needs additional education  Clinical Observations/Feedback: Pt did not attend group.     [name], LRT/CTRS         [name] A 09/26/2016 12:02 PM
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
## 2016-09-26 NOTE — Tx Team (Signed)
Interdisciplinary Treatment and Diagnostic Plan Update  09/26/2016 Time of Session: 9:30am [place] MRN: [id number]  Principal Diagnosis: MDD (major depressive disorder), recurrent severe, without psychosis ([place])  Secondary Diagnoses: Principal Problem:   MDD (major depressive disorder), recurrent severe, without psychosis ([place])   Current Medications:  Current Facility-Administered Medications  Medication Dose Route Frequency Provider Last Rate Last Dose  . alum & mag hydroxide-simeth (MAALOX/MYLANTA) 200-200-20 MG/5ML suspension 30 mL  30 mL Oral Q4H PRN Patrecia Pour, NP      . gabapentin (NEURONTIN) capsule 300 mg  300 mg Oral TID Patrecia Pour, NP   300 mg at 09/26/16 0819  . hydrOXYzine (ATARAX/VISTARIL) tablet 25 mg  25 mg Oral Q6H PRN Patrecia Pour, NP   25 mg at 09/26/16 [id number]  . ibuprofen (ADVIL,MOTRIN) tablet 800 mg  800 mg Oral TID PRN Rozetta Nunnery, NP   800 mg at 09/26/16 [id number]  . levothyroxine (SYNTHROID, LEVOTHROID) tablet 100 mcg  100 mcg Oral QAC breakfast Patrecia Pour, NP   100 mcg at 09/26/16 0615  . magnesium hydroxide (MILK OF MAGNESIA) suspension 30 mL  30 mL Oral Daily PRN Patrecia Pour, NP      . nicotine polacrilex (NICORETTE) gum 2 mg  2 mg Oral PRN Linard Millers, MD   2 mg at 09/24/16 1620  . QUEtiapine (SEROQUEL) tablet 100 mg  100 mg Oral QHS Patrecia Pour, NP   100 mg at 09/25/16 2135  . traZODone (DESYREL) tablet 100 mg  100 mg Oral QHS PRN Patrecia Pour, NP   100 mg at 09/25/16 2138   PTA Medications: Prescriptions Prior to Admission  Medication Sig Dispense Refill Last Dose  . cyclobenzaprine (FLEXERIL) 10 MG tablet Take 1 tablet (10 mg total) by mouth 3 (three) times daily. (Patient taking differently: Take 10 mg by mouth 3 (three) times daily as needed for muscle spasms. ) 30 tablet 0 Past Month at Unknown time  . gabapentin (NEURONTIN) 300 MG capsule Take 1 capsule (300 mg total) by mouth 3 (three) times daily. (Patient not taking:  Reported on 09/23/2016) 90 capsule 0 Not Taking at Unknown time  . gabapentin (NEURONTIN) 300 MG capsule Take 1 capsule (300 mg total) by mouth 3 (three) times daily. 90 capsule 0 09/22/2016 at Unknown time  . hydrOXYzine (ATARAX/VISTARIL) 25 MG tablet Take 1 tablet (25 mg total) by mouth every 6 (six) hours as needed for anxiety. 45 tablet 0 Past Month at Unknown time  . hydrOXYzine (ATARAX/VISTARIL) 25 MG tablet Take 1 tablet (25 mg total) by mouth every 6 (six) hours as needed for anxiety. (Patient not taking: Reported on 09/23/2016) 30 tablet 0 Not Taking at Unknown time  . levothyroxine (SYNTHROID, LEVOTHROID) 100 MCG tablet Take 1 tablet (100 mcg total) by mouth daily before breakfast. (Patient not taking: Reported on 09/23/2016) 30 tablet 0 Not Taking at Unknown time  . levothyroxine (SYNTHROID, LEVOTHROID) 100 MCG tablet Take 1 tablet (100 mcg total) by mouth daily before breakfast. 30 tablet 0 Past Month at Unknown time  . nicotine polacrilex (NICORETTE) 2 MG gum Take 1 each (2 mg total) by mouth as needed for smoking cessation. (Patient not taking: Reported on 11/13/2015) 100 tablet 0 Not Taking at Unknown time  . QUEtiapine (SEROQUEL) 100 MG tablet Take 1 tablet (100 mg total) by mouth at bedtime. (Patient not taking: Reported on 09/23/2016) 30 tablet 0 Not Taking at Unknown time  . QUEtiapine (SEROQUEL) 100 MG tablet  Take 1 tablet (100 mg total) by mouth at bedtime. 30 tablet 0 09/22/2016 at Unknown time  . traZODone (DESYREL) 100 MG tablet Take 1 tablet (100 mg total) by mouth at bedtime as needed for sleep. (Patient not taking: Reported on 09/23/2016) 30 tablet 0 Not Taking at Unknown time  . traZODone (DESYREL) 100 MG tablet Take 1 tablet (100 mg total) by mouth at bedtime as needed for sleep. 30 tablet 0 Past Month at Unknown time    Patient Stressors: Financial difficulties Health problems Marital or family conflict Medication change or noncompliance Substance abuse  Patient  Strengths: Ability for insight Active sense of humor Communication skills General fund of knowledge Motivation for treatment/growth Special hobby/interest Supportive family/friends Work skills  Treatment Modalities: Medication Management, Group therapy, Case management,  1 to 1 session with clinician, Psychoeducation, Recreational therapy.   Physician Treatment Plan for Primary Diagnosis: MDD (major depressive disorder), recurrent severe, without psychosis ([place]) Long Term Goal(s):     Short Term Goals:    Medication Management: Evaluate patient's response, side effects, and tolerance of medication regimen.  Therapeutic Interventions: 1 to 1 sessions, Unit Group sessions and Medication administration.  Evaluation of Outcomes: Progressing    RN Treatment Plan for Primary Diagnosis: MDD (major depressive disorder), recurrent severe, without psychosis ([place]) Long Term Goal(s): Knowledge of disease and therapeutic regimen to maintain health will improve  Short Term Goals: Ability to remain free from injury will improve, Ability to identify and develop effective coping behaviors will improve and Compliance with prescribed medications will improve  Medication Management: RN will administer medications as ordered by provider, will assess and evaluate patient's response and provide education to patient for prescribed medication. RN will report any adverse and/or side effects to prescribing provider.  Therapeutic Interventions: 1 on 1 counseling sessions, Psychoeducation, Medication administration, Evaluate responses to treatment, Monitor vital signs and CBGs as ordered, Perform/monitor CIWA, COWS, AIMS and Fall Risk screenings as ordered, Perform wound care treatments as ordered.  Evaluation of Outcomes: Progressing   LCSW Treatment Plan for Primary Diagnosis: MDD (major depressive disorder), recurrent severe, without psychosis ([place]) Long Term Goal(s): Safe transition to appropriate next  level of care at discharge, Engage patient in therapeutic group addressing interpersonal concerns.  Short Term Goals: Engage patient in aftercare planning with referrals and resources, Increase social support, Increase emotional regulation, Identify triggers associated with mental health/substance abuse issues and Increase skills for wellness and recovery  Therapeutic Interventions: Assess for all discharge needs, 1 to 1 time with Social worker, Explore available resources and support systems, Assess for adequacy in community support network, Educate family and significant other(s) on suicide prevention, Complete Psychosocial Assessment, Interpersonal group therapy.  Evaluation of Outcomes: Progressing   Progress in Treatment :  Attending groups: Continuing to assess  Participating in groups: Continuing to assess  Taking medication as prescribed: Yes, MD continuing to assess for appropriate medication regimen  Toleration medication: Yes  Family/Significant other contact made: Treatment team assessing for appropriate contacts  Patient understands diagnosis: Yes  Discussing patient identified problems/goals with staff: Yes  Medical problems stabilized or resolved: Yes  Denies suicidal/homicidal ideation: Treatment team continuing to asses  Issues/concerns per patient self-inventory: None reported  Other: N/A  New problem(s) identified: None reported at this time    New Short Term/Long Term Goal(s): None at this time    Discharge Plan or Barriers: Treatment team continuing to assess.    Reason for Continuation of Hospitalization: Anxiety Depression Medication stabilization Suicidal Ideations Withdrawal  symptoms  Estimated Length of Stay: 3-5 days    Attendees:  Patient:              Physician: Dr. Sharolyn Douglas, Dr. Shea Evans, MD  09/26/2016   9:30am  Nursing: Kerby Nora, Christa Michae Kava     09/26/2016 9:30am  RN Care Manager: Lars Pinks, CM   09/26/2016 9:30am  Social Workers: Peri Maris, LCSW, Tilden Fossa, LCSW, [place], LCSW   09/26/2016 9:30am  Nurse Pratictioners: Samuel Jester, NP, Lindell Spar, Ricky Ala, NP 09/26/2016 9:30am  Other:              09/26/2016 9:30am    Scribe for Treatment Team: Tilden Fossa, [place] Worker [facility] [phone]
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
## 2016-09-27 LAB — LIPID PANEL
CHOL/HDL RATIO: 2.3 ratio
Cholesterol: 152 mg/dL (ref 0–200)
HDL: 65 mg/dL (ref >40)
LDL CALC: 65 mg/dL (ref 0–99)
Triglycerides: 111 mg/dL (ref <150)
VLDL: 22 mg/dL (ref 0–40)

## 2016-09-27 LAB — T4, FREE: Free T4: 0.52 ng/dL — ABNORMAL LOW (ref 0.61–1.12)

## 2016-09-27 LAB — TSH: TSH: 18.254 u[IU]/mL — ABNORMAL HIGH (ref 0.350–4.500)

## 2016-09-27 MED ORDER — QUETIAPINE FUMARATE 25 MG PO TABS
75.0000 mg | ORAL_TABLET | Freq: Every day | ORAL | Status: DC
  Administered 2016-09-27 – 2016-09-29 (×3): 75 mg via ORAL
  Filled 2016-09-27 (×5): qty 3

## 2016-09-27 MED ORDER — DICLOFENAC SODIUM 1 % TD GEL
2.0000 g | Freq: Four times a day (QID) | TRANSDERMAL | Status: DC | PRN
  Administered 2016-09-27 – 2016-09-29 (×3): 2 g via TOPICAL
  Filled 2016-09-27: qty 100

## 2016-09-27 MED ORDER — MUSCLE RUB 10-15 % EX CREA
TOPICAL_CREAM | Freq: Four times a day (QID) | CUTANEOUS | Status: DC | PRN
  Administered 2016-09-27: 17:00:00 via TOPICAL
  Administered 2016-09-28: 1 via TOPICAL
  Administered 2016-09-29: 17:00:00 via TOPICAL
  Filled 2016-09-27: qty 85

## 2016-09-27 NOTE — [facility] Group Notes (Signed)
[place] LCSW Group Therapy 09/27/2016 1:15 PM Type of Therapy: Group Therapy Participation Level: Active  Participation Quality: Attentive  Affect:Appropriate  Cognitive: Alert and Oriented  Insight: Developing/Improving and Engaged  Engagement in Therapy: Developing/Improving and Engaged  Modes of Intervention: Activity, Clarification, Confrontation, Discussion, Education, Exploration, Limit-setting, Orientation, Problem-solving, Rapport Building, Art therapist, Socialization and Support  Summary of Progress/Problems: Patient was attentive and engaged with speaker from [employer]. Patient was attentive to speaker while they shared their story of dealing with mental health and overcoming it. Patient expressed interest in their programs and services and received information on their agency. Patient processed ways they can relate to the speaker.   Tilden Fossa, LCSW Clinical Social Worker [facility] [phone]

## 2016-09-27 NOTE — [facility] Group Notes (Signed)
Adult Psychoeducational Group Note  Date:  09/27/2016 Time:  0900-0930  Group Topic/Focus: Recovery Goals  Patient joined for last part of group, declined to share "I need some time to think about that" when asked what he was planning on doing for recovery, stated he would speak with his nurse 1:1.  Melene Plan Selicia Wind[place] 09/27/2016, 10:35 AM

## 2016-09-27 NOTE — Progress Notes (Signed)
The patient attended last evening's A.A. Meeting and was appropriate.

## 2016-09-27 NOTE — Progress Notes (Signed)
[facility] MD Progress Note  09/27/2016 2:23 PM  Patient Active Problem List   Diagnosis Date Noted  . Major depressive disorder, recurrent severe without psychotic features ([place]) 09/23/2016  . MDD (major depressive disorder), recurrent severe, without psychosis ([place]) 09/23/2016  . Cocaine abuse 12/21/2015  . Major depressive disorder, recurrent episode ([place]) 11/08/2015  . Alcohol dependence with uncomplicated withdrawal ([place])   . Thyroid activity decreased   . Cocaine dependence with cocaine-induced mood disorder ([place]) 10/03/2015  . Alcohol dependence ([place]) 10/03/2015  . Hypothyroidism 10/03/2015    Diagnosis: Cocaine use disorder, alcohol use disorder, marital issues chronic dysthymia  Subjective: Patient denies suicidal or homicidal ideation here in the hospital but does not yet feel safe to go out. We discussed at length that he is attempting to go to some programs that were suitable for him but may not allow him to continue with the current medications that he is on. Patient also attended the pharmacy group and was asking about medications he had heard about there but again suggested that we hold off starting any medications to only find out what's allowed whatever program he plans to go to. We will reduce the Seroquel to 75 mg by mouth daily at bedtime in anticipation that this medication may need to be discontinued if he wishes to go to [facility].  Patient reports ongoing right knee pain. He reports that the Motrin has not been effective. We will switch him to Voltaren gel and Ephraim Hamburger.  Patient continues to show some lack of insight about his relationship issues. He tends to blame his wife for his own problems and although their relationship is perhaps dysfunctional he has not yet been able to move forward to see himself as the primary factor in his life.  Objective: Well-developed well-nourished man in no apparent distress who is pleasant and appropriate but mood is rather anxious and affect is  congruent alert and oriented 3 speech and motor within normal limits thought processes linear and goal-directed thought content feels that he might harm himself if released but denies current suicidal or homicidal ideation, plan or intent here in the hospital alert and oriented insight and judgment are limited to fair IQ appears an average range   Current Facility-Administered Medications (Endocrine & Metabolic):  .  levothyroxine (SYNTHROID, LEVOTHROID) tablet 100 mcg           Current Facility-Administered Medications (Other):  .  alum & mag hydroxide-simeth (MAALOX/MYLANTA) 200-200-20 MG/5ML suspension 30 mL .  diclofenac sodium (VOLTAREN) 1 % transdermal gel 2 g .  gabapentin (NEURONTIN) capsule 300 mg .  hydrOXYzine (ATARAX/VISTARIL) tablet 25 mg .  magnesium hydroxide (MILK OF MAGNESIA) suspension 30 mL .  MUSCLE RUB CREA .  nicotine polacrilex (NICORETTE) gum 2 mg .  QUEtiapine (SEROQUEL) tablet 75 mg .  traZODone (DESYREL) tablet 100 mg  No current outpatient prescriptions on file.  Vital Signs:Blood pressure 136/86, pulse 64, temperature 98.3 F (36.8 C), temperature source Oral, resp. rate 18, height 6\' 5"  (1.956 m), weight 100.2 kg (221 lb), SpO2 100 %.    Lab Results:  Results for orders placed or performed during the hospital encounter of 09/23/16 (from the past 48 hour(s))  Lipid panel     Status: None   Collection Time: 09/27/16  6:39 AM  Result Value Ref Range   Cholesterol 152 0 - 200 mg/dL   Triglycerides 111 <150 mg/dL   HDL 65 >40 mg/dL   Total CHOL/HDL Ratio 2.3 RATIO   VLDL  22 0 - 40 mg/dL   LDL Cholesterol 65 0 - 99 mg/dL    Comment:        Total Cholesterol/HDL:CHD Risk Coronary Heart Disease Risk Table                     Men   Women  1/2 Average Risk   3.4   3.3  Average Risk       5.0   4.4  2 X Average Risk   9.6   7.1  3 X Average Risk  23.4   11.0        Use the calculated Patient Ratio above and the CHD Risk Table to determine the  patient's CHD Risk.        ATP III CLASSIFICATION (LDL):  <100     mg/dL   Optimal  100-129  mg/dL   Near or Above                    Optimal  130-159  mg/dL   Borderline  160-189  mg/dL   High  >190     mg/dL   Very High Performed at [facility]   TSH     Status: Abnormal   Collection Time: 09/27/16  6:39 AM  Result Value Ref Range   TSH 18.254 (H) 0.350 - 4.500 uIU/mL    Comment: Performed by a 3rd Generation assay with a functional sensitivity of <=0.01 uIU/mL. Performed at [facility]   T4, free     Status: Abnormal   Collection Time: 09/27/16  6:39 AM  Result Value Ref Range   Free T4 0.52 (L) 0.61 - 1.12 ng/dL    Comment: (NOTE) Biotin ingestion may interfere with free T4 tests. If the results are inconsistent with the TSH level, previous test results, or the clinical presentation, then consider biotin interference. If needed, order repeat testing after stopping biotin. Performed at [facility]     Physical Findings: AIMS: Facial and Oral Movements Muscles of Facial Expression: None, normal Lips and Perioral Area: None, normal Jaw: None, normal Tongue: None, normal,Extremity Movements Upper (arms, wrists, hands, fingers): None, normal Lower (legs, knees, ankles, toes): None, normal, Trunk Movements Neck, shoulders, hips: None, normal, Overall Severity Severity of abnormal movements (highest score from questions above): None, normal Incapacitation due to abnormal movements: None, normal Patient's awareness of abnormal movements (rate only patient's report): No Awareness, Dental Status Current problems with teeth and/or dentures?: No Does patient usually wear dentures?: No  CIWA:  CIWA-Ar Total: 1 COWS:  COWS Total Score: 2   Assessment/Plan: Plan as above, and Voltaren and Ben[place] and DC Motrin, continue with current medications, continue to monitor for safety and any detoxification problems. Continue to work with social work on  follow-up options after discharge.  Linard Millers, MD 09/27/2016, 2:23 PM

## 2016-09-27 NOTE — Plan of Care (Signed)
Problem: Activity: Goal: Imbalance in normal sleep/wake cycle will improve Outcome: Progressing Nurse discussed depression/anxiety/coping skills with patient. \

## 2016-09-27 NOTE — Plan of Care (Signed)
Problem: Health Behavior/Discharge Planning: Goal: Identification of resources available to assist in meeting health care needs will improve Outcome: Progressing Patient actively working on finding placements following discharge.  Goal: Compliance with treatment plan for underlying cause of condition will improve Outcome: Progressing Patient taking medications as prescribed and attending groups.

## 2016-09-27 NOTE — Progress Notes (Signed)
D:  Patient's self inventory sheet, patient has fair sleep, sleep medication is helpful.  Good appetite, normal energy level, poor concentration.  Rated depression and anxiety 5, hopeless 6.  Denied withdrawals.  SI off/on, contracts for safety, no plan while in [facility]  Physical pain, Right knee, worst pain in past 24 hours is #3, pain medication is helpful.  Goal is not worry about people that were in his life.  Mind is going back and forth, depression and anxiety.  Does have discharge plan. A:  Medications administered per MD orders.  Emotional support and encouragement given patient. R:  Denied A/V hallucinations.  SI off/on, contracts for safety, no plan while in [facility].  HI while outside of [facility], denied HI to any [facility] staff.  Safety maintained with 15 minute checks. "I do have suicidal and homicidal thoughts come to my mind when I'm not in a safe environment.   Here I feel safe so I don't have the thoughts of hurting me or others as much.  I still feel depressed about my life.  But I haven't been seeing things.  I hate my life because of the decisions I've made and most of all the people in my life that's caused me discomfort.  I get very irritated when I get around certain people."

## 2016-09-27 NOTE — Progress Notes (Signed)
Advance Directive booklet given to patient per MD instructions.

## 2016-09-28 LAB — HEMOGLOBIN A1C
HEMOGLOBIN A1C: 4.9 % (ref 4.8–5.6)
Mean Plasma Glucose: 94 mg/dL

## 2016-09-28 NOTE — Progress Notes (Signed)
Patient ID: Johnathan Baker, male   DOB: 1971-03-23, 45 y.o.   MRN: [id number] D: Patient has been appropriate. Patient is compliant with his medications.  He denies any withdrawal symptoms.  Patient denies any current thoughts of self harm.  He wrote on his self inventory that he is sleeping and eating well.  His concentration and energy are normal.  He rates his depression and anxiety as a 4; hopelessness as a 3.  His goal today is to "stop bad habits."  He listens attentively during groups and seems engaged.  He is hoping to get into [facility] or [employer].  Patient is hoping to get train transportation to [facility] on Friday afternoon.   A: Continue to monitor medication management and MD orders.  Continue 15 minutes safety checks per protocol.  Offer support and encouragement as needed. R: Patient is receptive to staff; his behavior is appropriate.

## 2016-09-28 NOTE — BHH Group Notes (Signed)
[facility] LCSW Group Therapy  09/28/2016 3:43 PM  Type of Therapy:  Group Therapy  Participation Level:  Active  Participation Quality:  Attentive  Affect:  Appropriate  Cognitive:  Alert and Oriented  Insight:  Improving  Engagement in Therapy:  Improving  Modes of Intervention:  Discussion, Education, Exploration, Problem-solving, Rapport Building, Socialization and Support  Summary of Progress/Problems: Today's Topic: Overcoming Obstacles. Patients identified one short term goal and potential obstacles in reaching this goal. Patients processed barriers involved in overcoming these obstacles. Patients identified steps necessary for overcoming these obstacles and explored motivation (internal and external) for facing these difficulties head on. Johnathan Baker was attentive and engaged during today's processing group. He shared that his biggest obstacle is getting to [facility] for "either [employer] or [employer]."Johnathan Baker stated that he is planning to enter one of those programs depending on a phone interview with [facility] on Friday morning and requires transportation. He was open to working with CSW to arrange Bus/train transportation to [facility] Friday afternoon. He continues to show progress in the group setting with improving insight.   Kimber Relic Smart LCSW 09/28/2016, 3:43 PM

## 2016-09-28 NOTE — Progress Notes (Signed)
[facility] MD Progress Note  09/28/2016 11:50 AM  Patient Active Problem List   Diagnosis Date Noted  . Major depressive disorder, recurrent severe without psychotic features ([place]) 09/23/2016  . MDD (major depressive disorder), recurrent severe, without psychosis ([place]) 09/23/2016  . Cocaine abuse 12/21/2015  . Major depressive disorder, recurrent episode ([place]) 11/08/2015  . Alcohol dependence with uncomplicated withdrawal ([place])   . Thyroid activity decreased   . Cocaine dependence with cocaine-induced mood disorder ([place]) 10/03/2015  . Alcohol dependence ([place]) 10/03/2015  . Hypothyroidism 10/03/2015    Diagnosis: Cocaine use disorder, depression, relationship issues  Subjective: Patient reports that he does not have any current suicidal or homicidal ideation, plan or intent but staff reports patient has talked of believing in the sense of killing himself at some point in the future,  possibly after discharge. Today he is hopeful however about going to [employer] on Friday.  Objective: Well-developed well-nourished man in no apparent distress pleasant and appropriate speech and motor within normal limits mood is okay affect is mildly anxious thought content does deny current suicidal or homicidal ideation, plan or intent while in the hospital but there is some question with him speaking with staff about possibly killing himself in the future, alert and oriented 3 IQ appears an average range insight and judgment are fair   Current Facility-Administered Medications (Endocrine & Metabolic):  .  levothyroxine (SYNTHROID, LEVOTHROID) tablet 100 mcg           Current Facility-Administered Medications (Other):  .  alum & mag hydroxide-simeth (MAALOX/MYLANTA) 200-200-20 MG/5ML suspension 30 mL .  diclofenac sodium (VOLTAREN) 1 % transdermal gel 2 g .  gabapentin (NEURONTIN) capsule 300 mg .  hydrOXYzine (ATARAX/VISTARIL) tablet 25 mg .  magnesium hydroxide (MILK OF MAGNESIA)  suspension 30 mL .  MUSCLE RUB CREA .  nicotine polacrilex (NICORETTE) gum 2 mg .  QUEtiapine (SEROQUEL) tablet 75 mg .  traZODone (DESYREL) tablet 100 mg  No current outpatient prescriptions on file.  Vital Signs:Blood pressure 137/84, pulse 63, temperature 98 F (36.7 C), temperature source Oral, resp. rate 18, height 6\' 5"  (1.956 m), weight 100.2 kg (221 lb), SpO2 100 %.    Lab Results:  Results for orders placed or performed during the hospital encounter of 09/23/16 (from the past 48 hour(s))  Lipid panel     Status: None   Collection Time: 09/27/16  6:39 AM  Result Value Ref Range   Cholesterol 152 0 - 200 mg/dL   Triglycerides 111 <150 mg/dL   HDL 65 >40 mg/dL   Total CHOL/HDL Ratio 2.3 RATIO   VLDL 22 0 - 40 mg/dL   LDL Cholesterol 65 0 - 99 mg/dL    Comment:        Total Cholesterol/HDL:CHD Risk Coronary Heart Disease Risk Table                     Men   Women  1/2 Average Risk   3.4   3.3  Average Risk       5.0   4.4  2 X Average Risk   9.6   7.1  3 X Average Risk  23.4   11.0        Use the calculated Patient Ratio above and the CHD Risk Table to determine the patient's CHD Risk.        ATP III CLASSIFICATION (LDL):  <100     mg/dL   Optimal  100-129  mg/dL  Near or Above                    Optimal  130-159  mg/dL   Borderline  160-189  mg/dL   High  >190     mg/dL   Very High Performed at [facility]   Hemoglobin A1c     Status: None   Collection Time: 09/27/16  6:39 AM  Result Value Ref Range   Hgb A1c MFr Bld 4.9 4.8 - 5.6 %    Comment: (NOTE)         Pre-diabetes: 5.7 - 6.4         Diabetes: >6.4         Glycemic control for adults with diabetes: <7.0    Mean Plasma Glucose 94 mg/dL    Comment: (NOTE) Performed At: [facility] [place], [place] [id number] Lindon Romp MD [id number] Performed at [facility]   TSH     Status: Abnormal   Collection Time: 09/27/16  6:39 AM  Result  Value Ref Range   TSH 18.254 (H) 0.350 - 4.500 uIU/mL    Comment: Performed by a 3rd Generation assay with a functional sensitivity of <=0.01 uIU/mL. Performed at [facility]   T4, free     Status: Abnormal   Collection Time: 09/27/16  6:39 AM  Result Value Ref Range   Free T4 0.52 (L) 0.61 - 1.12 ng/dL    Comment: (NOTE) Biotin ingestion may interfere with free T4 tests. If the results are inconsistent with the TSH level, previous test results, or the clinical presentation, then consider biotin interference. If needed, order repeat testing after stopping biotin. Performed at [facility]     Physical Findings: AIMS: Facial and Oral Movements Muscles of Facial Expression: None, normal Lips and Perioral Area: None, normal Jaw: None, normal Tongue: None, normal,Extremity Movements Upper (arms, wrists, hands, fingers): None, normal Lower (legs, knees, ankles, toes): None, normal, Trunk Movements Neck, shoulders, hips: None, normal, Overall Severity Severity of abnormal movements (highest score from questions above): None, normal Incapacitation due to abnormal movements: None, normal Patient's awareness of abnormal movements (rate only patient's report): No Awareness, Dental Status Current problems with teeth and/or dentures?: No Does patient usually wear dentures?: No  CIWA:  CIWA-Ar Total: 1 COWS:  COWS Total Score: 1   Assessment/Plan: Patient denies current suicidal or homicidal ideation, plan or intent and today is enthusiastic about going to a program Friday in [facility]. We will continue to monitor for any acute suicidality and also continue current treatment to improve mood so that patient can function better as an outpatient area plan is for patient to transition to program on Friday if present course continues.  Linard Millers, MD 09/28/2016, 11:50 AM

## 2016-09-28 NOTE — Progress Notes (Signed)
Recreation Therapy Notes  Date: 09/28/16 Time: 0930 Location: 300 Hall Dayroom  Group Topic: Stress Management  Goal Area(s) Addresses:  Patient will verbalize importance of using healthy stress management.  Patient will identify positive emotions associated with healthy stress management.   Behavioral Response: Engaged  Intervention: Calm App  Activity :  Happiness Meditation.  LRT introduced the stress management technique of meditation.  LRT played a meditation on happiness to allow patients to engage in the technique.  Patients were to follow along and focus on the meditation to experience the benefits of meditation.  Education:  Stress Management, Discharge Planning.   Education Outcome: Acknowledges edcuation/In group clarification offered/Needs additional education  Clinical Observations/Feedback: Pt attended group.    [name], LRT/CTRS         [name] A 09/28/2016 11:58 AM

## 2016-09-28 NOTE — Plan of Care (Signed)
Problem: Health Behavior/Discharge Planning: Goal: Ability to make decisions will improve Outcome: Progressing Patient working on discharge placement options per CSW advisement.  Problem: Self-Concept: Goal: Level of anxiety will decrease Outcome: Progressing Patient denied complaints of anxiety at this time.

## 2016-09-28 NOTE — Progress Notes (Addendum)
Nursing Progress Note 7p-7a  D) Patient presents pleasant and cooperative. Patient has flat affect but brightens on interaction. Patient reports a good day and continues to talk about discharging to [facility] or [place]. Patient denies SI/HI/AVH at this time. Patient denies pain. Patient contracts for safety at this time. Patient requests "all medication available to me" and says "I do not want to have to ask for it".   A) Patient informed he will still need to request PRN medications per hospital policy. PRN trazodone given for sleep. Emotional support given. Patient on Q 38min safety checks. Opportunities for questions or concerns presented to patient. Patient encouraged to continue to work on discharge planning.  R) Patient receptive to interaction with nurse. Patient remains safe on the unit at this time. Patient is resting in his bed without complaint. Will continue to monitor.

## 2016-09-28 NOTE — BHH Group Notes (Signed)
Patient attend group. His day started 1  prgress to a 6. His goal was to attend all meeting activities and participate. He working on trying to be more positive. He is currently working on what happens when he is discharged.

## 2016-09-29 DIAGNOSIS — F1099 Alcohol use, unspecified with unspecified alcohol-induced disorder: Secondary | ICD-10-CM

## 2016-09-29 LAB — HEMOGLOBIN A1C
HEMOGLOBIN A1C: 4.8 % (ref 4.8–5.6)
MEAN PLASMA GLUCOSE: 91 mg/dL

## 2016-09-29 NOTE — Plan of Care (Signed)
Problem: Education: Goal: Emotional status will improve Outcome: Progressing Patient is looking forward to discharge tomorrow.  His thoughts are goal oriented and his mood is stable.

## 2016-09-29 NOTE — Progress Notes (Signed)
[facility] MD Progress Note  09/29/2016 10:44 AM Johnathan Baker  MRN:  [id number] Subjective:  Patient is doing well.  No c/o.  Tolerating meds.  Anxious about d/c plan.  He is goal oriented at gaining entry to [facility] program in [place].  Objective:  Patient is denying SI.  He is anxious about disposition planning.    Principal Problem: MDD (major depressive disorder), recurrent severe, without psychosis ([place]) Diagnosis:   Patient Active Problem List   Diagnosis Date Noted  . Major depressive disorder, recurrent severe without psychotic features ([place]) [F33.2] 09/23/2016  . MDD (major depressive disorder), recurrent severe, without psychosis ([place]) [F33.2] 09/23/2016  . Cocaine abuse [F14.10] 12/21/2015  . Major depressive disorder, recurrent episode ([place]) [F33.9] 11/08/2015  . Alcohol dependence with uncomplicated withdrawal ([place]) [F10.230]   . Thyroid activity decreased [E03.9]   . Cocaine dependence with cocaine-induced mood disorder ([place]) [F14.24] 10/03/2015  . Alcohol dependence ([place]) [F10.20] 10/03/2015  . Hypothyroidism [E03.9] 10/03/2015   Total Time spent with patient: 30 minutes  Past Psychiatric History: see HPI  Past Medical History:  Past Medical History:  Diagnosis Date  . Back pain   . Depression   . Hypertension   . Thyroid disease    History reviewed. No pertinent surgical history. Family History: History reviewed. No pertinent family history. Family Psychiatric  History: see HPI Social History:  History  Alcohol Use  . Yes    Comment: binge drinks     History  Drug Use  . Types: Cocaine, Marijuana    Social History   Social History  . Marital status: Married    Spouse name: N/A  . Number of children: N/A  . Years of education: N/A   Social History Main Topics  . Smoking status: Current Every Day Smoker    Packs/day: 0.50    Types: Cigarettes  . Smokeless tobacco: Never Used  . Alcohol use Yes     Comment: binge drinks  . Drug use:     Types: Cocaine,  Marijuana  . Sexual activity: Yes   Other Topics Concern  . None   Social History Narrative  . None   Additional Social History:                         Sleep: Good  Appetite:  Good  Current Medications: Current Facility-Administered Medications  Medication Dose Route Frequency Provider Last Rate Last Dose  . alum & mag hydroxide-simeth (MAALOX/MYLANTA) 200-200-20 MG/5ML suspension 30 mL  30 mL Oral Q4H PRN Patrecia Pour, NP      . diclofenac sodium (VOLTAREN) 1 % transdermal gel 2 g  2 g Topical QID PRN Linard Millers, MD   2 g at 09/28/16 [phone]  . gabapentin (NEURONTIN) capsule 300 mg  300 mg Oral TID Patrecia Pour, NP   300 mg at 09/29/16 0813  . hydrOXYzine (ATARAX/VISTARIL) tablet 25 mg  25 mg Oral Q6H PRN Patrecia Pour, NP   25 mg at 09/28/16 1711  . levothyroxine (SYNTHROID, LEVOTHROID) tablet 100 mcg  100 mcg Oral QAC breakfast Patrecia Pour, NP   100 mcg at 09/29/16 [phone]  . magnesium hydroxide (MILK OF MAGNESIA) suspension 30 mL  30 mL Oral Daily PRN Patrecia Pour, NP      . MUSCLE RUB CREA   Topical QID PRN Linard Millers, MD   1 application at 0000000 [phone]  . nicotine polacrilex (NICORETTE) gum 2 mg  2  mg Oral PRN Linard Millers, MD   2 mg at 09/29/16 1043  . QUEtiapine (SEROQUEL) tablet 75 mg  75 mg Oral QHS Linard Millers, MD   75 mg at 09/28/16 2142  . traZODone (DESYREL) tablet 100 mg  100 mg Oral QHS PRN Patrecia Pour, NP   100 mg at 09/28/16 2142    Lab Results:  Results for orders placed or performed during the hospital encounter of 09/23/16 (from the past 48 hour(s))  Hemoglobin A1c     Status: None   Collection Time: 09/28/16  6:49 AM  Result Value Ref Range   Hgb A1c MFr Bld 4.8 4.8 - 5.6 %    Comment: (NOTE)         Pre-diabetes: 5.7 - 6.4         Diabetes: >6.4         Glycemic control for adults with diabetes: <7.0    Mean Plasma Glucose 91 mg/dL    Comment: (NOTE) Performed At: [facility] [address] [place], [place] [id number] Lindon Romp MD [id number] Performed at [facility]     Blood Alcohol level:  Lab Results  Component Value Date   ETH <5 09/23/2016   ETH 6 (H) 123XX123    Metabolic Disorder Labs: Lab Results  Component Value Date   HGBA1C 4.8 09/28/2016   MPG 91 09/28/2016   MPG 94 09/27/2016   No results found for: PROLACTIN Lab Results  Component Value Date   CHOL 152 09/27/2016   TRIG 111 09/27/2016   HDL 65 09/27/2016   CHOLHDL 2.3 09/27/2016   VLDL 22 09/27/2016   LDLCALC 65 09/27/2016   LDLCALC 73 02/11/2014    Physical Findings: AIMS: Facial and Oral Movements Muscles of Facial Expression: None, normal Lips and Perioral Area: None, normal Jaw: None, normal Tongue: None, normal,Extremity Movements Upper (arms, wrists, hands, fingers): None, normal Lower (legs, knees, ankles, toes): None, normal, Trunk Movements Neck, shoulders, hips: None, normal, Overall Severity Severity of abnormal movements (highest score from questions above): None, normal Incapacitation due to abnormal movements: None, normal Patient's awareness of abnormal movements (rate only patient's report): No Awareness, Dental Status Current problems with teeth and/or dentures?: No Does patient usually wear dentures?: No  CIWA:  CIWA-Ar Total: 1 COWS:  COWS Total Score: 1  Musculoskeletal: Strength & Muscle Tone: within normal limits Gait & Station: normal Patient leans: N/A  Psychiatric Specialty Exam: Physical Exam  Nursing note and vitals reviewed. Psychiatric: He has a normal mood and affect. His behavior is normal.    Review of Systems  All other systems reviewed and are negative.   Blood pressure (!) 148/75, pulse 76, temperature 98.5 F (36.9 C), temperature source Oral, resp. rate 16, height 6\' 5"  (1.956 m), weight 100.2 kg (221 lb), SpO2 100 %.Body mass index is 26.21 kg/m.  General Appearance: Neat  Eye Contact:  Good   Speech:  Clear and Coherent  Volume:  Normal  Mood:  Euthymic  Affect:  Appropriate  Thought Process:  Coherent and Goal Directed  Orientation:  Full (Time, Place, and Person)  Thought Content:  Logical  Suicidal Thoughts:  No  Homicidal Thoughts:  No  Memory:  Immediate;   Good Recent;   Good Remote;   Good  Judgement:  Good  Insight:  Good  Psychomotor Activity:  Normal  Concentration:  Concentration: Good and Attention Span: Good  Recall:  Good  Fund of Knowledge:  Good  Language:  Good  Akathisia:  Negative  Handed:  Right  AIMS (if indicated):     Assets:  Physical Health Resilience  ADL's:  Intact  Cognition:  WNL  Sleep:  Number of Hours: 6.5   Treatment Plan Summary: Review of chart, vital signs, medications, and notes.  1-Individual and group therapy  2-Medication management for depression and anxiety: Medications reviewed with the patient.   Gabapentin 300 TID agitation control, Seroquel 75 mg QHS mood stabilization,  Trazodone 100 mg insomnia 3-Coping skills for depression, anxiety  4-Continue crisis stabilization and management  5-Address health issues--monitoring vital signs, stable  6-Treatment plan in progress to prevent relapse of depression and anxiety  Janett Labella, NP [facility] 09/29/2016, 10:44 AM

## 2016-09-29 NOTE — Progress Notes (Signed)
D: Patient is pleasant and is interacting well with his peers.  Patient is hoping to be discharged tomorrow.  Patient is attempting to obtain a train ticket to [facility].  He hopes to go to [employer].  Patient states he has suicidal thought "sometimes."  He does not have any current thoughts of self harm.  He denies any withdrawal symptoms.  His goal today is to "stay out of bed until tonight; take my medications and make some phone calls."  Patient is well groomed with good hygiene.  His affect is brighter today.  His thought processes are intact; speech is logical and goal directed. A: Continue to monitor medication management and MD orders.  Safety checks completed every 15 minutes per protocol.  Offer support and encouragement as needed. R: Patient is receptive to staff; his behavior is appropriate.

## 2016-09-29 NOTE — Plan of Care (Signed)
Problem: Medication: Goal: Compliance with prescribed medication regimen will improve Outcome: Progressing Pt has been compliant with scheduled medication tonight.

## 2016-09-29 NOTE — Progress Notes (Signed)
Patient ID: Johnathan Baker, male   DOB: 1971/07/07, 45 y.o.   MRN: [id number] Patient states that he cannot discharge tomorrow.  He states that he is trying to get into [facility] and they will not have a place for him until next week.  Informed patient that he will need to speak with his Education officer, museum and MD.

## 2016-09-29 NOTE — Progress Notes (Signed)
D: Pt was in the day room upon initial approach.  Pt presents with appropriate affect and depressed, pleasant mood.  Pt reports his day was "good" and "I got my everything set up, ready to roll out Friday."  Pt reports he will go to an aftercare facility in [place] and he feels that his is a safe plan.  Pt denies SI/HI, denies hallucinations, denies pain.  Pt has been visible in milieu interacting with peers and staff appropriately.  Pt attended evening group.   A: Introduced self to pt.  Actively listened to pt and offered support and encouragement. Medications administered per order.  PRN medication administered for sleep. R: Pt is safe on the unit.  Pt is compliant with medications.  Pt verbally contracts for safety.  Will continue to monitor and assess.

## 2016-09-29 NOTE — BHH Group Notes (Signed)
[facility] Group Notes:  (Nursing/MHT/Case Management/Adjunct)  Date:  09/29/2016  Time:  0900 am  Type of Therapy:  Psychoeducational Skills  Participation Level:  Active  Participation Quality:  Appropriate and Attentive  Affect:  Appropriate  Cognitive:  Alert and Appropriate  Insight:  Appropriate  Engagement in Group:  Engaged  Modes of Intervention:  Support  Summary of Progress/Problems:  Patient's goal is to "eat a lot of [place]."  Patient will be a probable discharge tomorrow.  Patient hopes to obtain a train ticket to [place], Arman Bogus 09/29/2016, 10:14 AM

## 2016-09-30 MED ORDER — NICOTINE POLACRILEX 2 MG MT GUM: 2.0000 mg | CHEWING_GUM | OROMUCOSAL | 0 refills | Status: DC | PRN

## 2016-09-30 MED ORDER — DICLOFENAC SODIUM 1 % TD GEL: 2.0000 g | Freq: Four times a day (QID) | TRANSDERMAL | Status: DC | PRN

## 2016-09-30 MED ORDER — LEVOTHYROXINE SODIUM 100 MCG PO TABS: 100.0000 ug | ORAL_TABLET | Freq: Every day | ORAL | 0 refills | Status: DC

## 2016-09-30 MED ORDER — GABAPENTIN 300 MG PO CAPS: 300.0000 mg | ORAL_CAPSULE | Freq: Three times a day (TID) | ORAL | 0 refills | Status: DC

## 2016-09-30 MED ORDER — QUETIAPINE FUMARATE 25 MG PO TABS: 75.0000 mg | ORAL_TABLET | Freq: Every day | ORAL | 0 refills | Status: DC

## 2016-09-30 MED ORDER — TRAZODONE HCL 100 MG PO TABS: 100.0000 mg | ORAL_TABLET | Freq: Every evening | ORAL | 0 refills | Status: DC | PRN

## 2016-09-30 MED ORDER — HYDROXYZINE HCL 25 MG PO TABS: 25.0000 mg | ORAL_TABLET | Freq: Four times a day (QID) | ORAL | 0 refills | Status: DC | PRN

## 2016-09-30 MED ORDER — MUSCLE RUB 10-15 % EX CREA: 1.0000 "application " | TOPICAL_CREAM | Freq: Four times a day (QID) | CUTANEOUS | 0 refills | Status: DC | PRN

## 2016-09-30 NOTE — Progress Notes (Signed)
D: Pt is flat isolative and withdrawn to self. Pt endorses moderate depression and anxiety. Pt also complained of moderate R. knee pain denies. Pt at the time of assessment denies SI, HI or AVH. Pt was observed interacting with peers. Pt remained calm and cooperative. A:  Support, encouragement, and safe environment provided.  15-minute safety checks continue. R: Pt was med compliant.  Pt did attend wrap-up group. Safety checks continue.

## 2016-09-30 NOTE — Progress Notes (Signed)
[place] Group Notes:  (Nursing/MHT/Case Management/Adjunct)  Date:  09/30/2016  Time:  12:21 AM  Type of Therapy:  Psychoeducational Skills  Participation Level:  Active  Participation Quality:  Appropriate  Affect:  Appropriate  Cognitive:  Appropriate  Insight:  Good  Engagement in Group:  Engaged  Modes of Intervention:  Education  Summary of Progress/Problems: Patient stated in group that he has a telephone interview with [facility] tomorrow morning. He is working on trying to remain positive and keeping his blood pressure down. His goal for tomorrow is to not allow things to "sidetrack" him.   Rudi Bunyard S 09/30/2016, 12:21 AM

## 2016-09-30 NOTE — Progress Notes (Signed)
D: Patient to be discharged today.  He is awaiting a bed at [facility] and will go to [facility] until a bed becomes available.  Patient rates his depression and hopelessness as a 3; anxiety as a 4.  He has telephone interview today at 73 with [facility].  He denies any thoughts of self harm.  Patient is sleeping and eating well; his energy is normal; concentration good.  He is pleasant and cooperative. A: Initiate discharge paperwork.  Safety checks every 15 minutes per protocol.  Offer support and encouragement as needed. R: Patient is receptive to staff; his behavior is appropriate.

## 2016-09-30 NOTE — Progress Notes (Signed)
Recreation Therapy Notes  Date: 09/30/16 Time: 0930 Location: 300 Hall Dayroom  Group Topic: Stress Management  Goal Area(s) Addresses:  Patient will verbalize importance of using healthy stress management.  Patient will identify positive emotions associated with healthy stress management.   Behavioral Response: Engaged  Intervention: Calm App  Activity :  Body Scan Meditation.  LRT introduced the stress management technique of meditation to the group.  LRT played a body scan meditation from the Calm App to allow patients to take notice of the stress and tension the body can hold.  Patients were to follow along with the app to participate in the activity to relieve their bodies of any tension or stress.  Education:  Stress Management, Discharge Planning.   Education Outcome: Acknowledges edcuation/In group clarification offered/Needs additional education  Clinical Observations/Feedback: Pt attended group.   [name], LRT/CTRS         [name] A 09/30/2016 11:23 AM

## 2016-09-30 NOTE — Progress Notes (Signed)
  [facility] Adult Case Management Discharge Plan :  Will you be returning to the same living situation after discharge:  No. Patient plans to go to shelter to await bed at [facility] At discharge, do you have transportation home?: Yes,  bus pass provided Do you have the ability to pay for your medications: Yes,  patient will be provided with prescriptions at discharge  Release of information consent forms completed and in the chart;  Patient's signature needed at discharge.  Patient to Follow up at: Follow-up Information    [facility] Follow up.   Why:  Please go to walk-in clinic within 7 days of discharge Monday-Friday at 9am for assessment for therapy and medication management services.  Contact information: [address] [place],  [address] Phone: [phone]          Next level of care provider has access to [place] and Suicide Prevention discussed: Yes,  with patient   Have you used any form of tobacco in the last 30 days? (Cigarettes, Smokeless Tobacco, Cigars, and/or Pipes): Yes  Has patient been referred to the Quitline?: Patient refused referral  Patient has been referred for addiction treatment: Yes  Johnathan Baker 09/30/2016, 9:44 AM

## 2016-09-30 NOTE — Progress Notes (Signed)
Discharge note:  Patient discharged per MD order.  Patient received all personal belongings from unit and locker.  Reviewed AVS/transition record with patient and he indicated understanding.  Patient will follow up with [facility].  He denies any thoughts of self harm.  Patient received prescriptions and medication samples.  Patient has a bed at [facility] waiting for him.  His sister will pick him up at the bus depot to take him to his destination.  Patient left ambulatory for the bus depot.

## 2016-09-30 NOTE — Tx Team (Signed)
Interdisciplinary Treatment and Diagnostic Plan Update  09/30/2016 Time of Session: 9:30am Johnathan Baker MRN: [id number]  Principal Diagnosis: MDD (major depressive disorder), recurrent severe, without psychosis ([place])   Current Medications:  Current Facility-Administered Medications  Medication Dose Route Frequency Provider Last Rate Last Dose  . alum & mag hydroxide-simeth (MAALOX/MYLANTA) 200-200-20 MG/5ML suspension 30 mL  30 mL Oral Q4H PRN Patrecia Pour, NP      . diclofenac sodium (VOLTAREN) 1 % transdermal gel 2 g  2 g Topical QID PRN Linard Millers, MD   2 g at 09/29/16 1701  . gabapentin (NEURONTIN) capsule 300 mg  300 mg Oral TID Patrecia Pour, NP   300 mg at 09/30/16 0813  . hydrOXYzine (ATARAX/VISTARIL) tablet 25 mg  25 mg Oral Q6H PRN Patrecia Pour, NP   25 mg at 09/29/16 1701  . levothyroxine (SYNTHROID, LEVOTHROID) tablet 100 mcg  100 mcg Oral QAC breakfast Patrecia Pour, NP   100 mcg at 09/30/16 0650  . magnesium hydroxide (MILK OF MAGNESIA) suspension 30 mL  30 mL Oral Daily PRN Patrecia Pour, NP      . MUSCLE RUB CREA   Topical QID PRN Linard Millers, MD      . nicotine polacrilex (NICORETTE) gum 2 mg  2 mg Oral PRN Linard Millers, MD   2 mg at 09/29/16 1813  . QUEtiapine (SEROQUEL) tablet 75 mg  75 mg Oral QHS Linard Millers, MD   75 mg at 09/29/16 2200  . traZODone (DESYREL) tablet 100 mg  100 mg Oral QHS PRN Patrecia Pour, NP   100 mg at 09/28/16 2142   PTA Medications: Prescriptions Prior to Admission  Medication Sig Dispense Refill Last Dose  . cyclobenzaprine (FLEXERIL) 10 MG tablet Take 1 tablet (10 mg total) by mouth 3 (three) times daily. (Patient taking differently: Take 10 mg by mouth 3 (three) times daily as needed for muscle spasms. ) 30 tablet 0 Past Month at Unknown time  . gabapentin (NEURONTIN) 300 MG capsule Take 1 capsule (300 mg total) by mouth 3 (three) times daily. (Patient not taking: Reported on 09/23/2016) 90  capsule 0 Not Taking at Unknown time  . gabapentin (NEURONTIN) 300 MG capsule Take 1 capsule (300 mg total) by mouth 3 (three) times daily. 90 capsule 0 09/22/2016 at Unknown time  . hydrOXYzine (ATARAX/VISTARIL) 25 MG tablet Take 1 tablet (25 mg total) by mouth every 6 (six) hours as needed for anxiety. 45 tablet 0 Past Month at Unknown time  . hydrOXYzine (ATARAX/VISTARIL) 25 MG tablet Take 1 tablet (25 mg total) by mouth every 6 (six) hours as needed for anxiety. (Patient not taking: Reported on 09/23/2016) 30 tablet 0 Not Taking at Unknown time  . levothyroxine (SYNTHROID, LEVOTHROID) 100 MCG tablet Take 1 tablet (100 mcg total) by mouth daily before breakfast. (Patient not taking: Reported on 09/23/2016) 30 tablet 0 Not Taking at Unknown time  . levothyroxine (SYNTHROID, LEVOTHROID) 100 MCG tablet Take 1 tablet (100 mcg total) by mouth daily before breakfast. 30 tablet 0 Past Month at Unknown time  . nicotine polacrilex (NICORETTE) 2 MG gum Take 1 each (2 mg total) by mouth as needed for smoking cessation. (Patient not taking: Reported on 11/13/2015) 100 tablet 0 Not Taking at Unknown time  . QUEtiapine (SEROQUEL) 100 MG tablet Take 1 tablet (100 mg total) by mouth at bedtime. (Patient not taking: Reported on 09/23/2016) 30 tablet 0 Not Taking at Unknown time  .  QUEtiapine (SEROQUEL) 100 MG tablet Take 1 tablet (100 mg total) by mouth at bedtime. 30 tablet 0 09/22/2016 at Unknown time  . traZODone (DESYREL) 100 MG tablet Take 1 tablet (100 mg total) by mouth at bedtime as needed for sleep. (Patient not taking: Reported on 09/23/2016) 30 tablet 0 Not Taking at Unknown time  . traZODone (DESYREL) 100 MG tablet Take 1 tablet (100 mg total) by mouth at bedtime as needed for sleep. 30 tablet 0 Past Month at Unknown time    Patient Stressors: Financial difficulties Health problems Marital or family conflict Medication change or noncompliance Substance abuse  Patient Strengths: Ability for  insight Active sense of humor Communication skills General fund of knowledge Motivation for treatment/growth Special hobby/interest Supportive family/friends Work skills  Treatment Modalities: Medication Management, Group therapy, Case management,  1 to 1 session with clinician, Psychoeducation, Recreational therapy.   Physician Treatment Plan for Primary Diagnosis: MDD (major depressive disorder), recurrent severe, without psychosis ([place]) Long Term Goal(s): Improvement in symptoms so as ready for discharge Improvement in symptoms so as ready for discharge   Short Term Goals: Ability to disclose and discuss suicidal ideas Ability to demonstrate self-control will improve Ability to identify changes in lifestyle to reduce recurrence of condition will improve Compliance with prescribed medications will improve  Medication Management: Evaluate patient's response, side effects, and tolerance of medication regimen.  Therapeutic Interventions: 1 to 1 sessions, Unit Group sessions and Medication administration.  Evaluation of Outcomes: Adequate for Discharge    RN Treatment Plan for Primary Diagnosis: MDD (major depressive disorder), recurrent severe, without psychosis ([place]) Long Term Goal(s): Knowledge of disease and therapeutic regimen to maintain health will improve  Short Term Goals: Ability to remain free from injury will improve, Ability to identify and develop effective coping behaviors will improve and Compliance with prescribed medications will improve  Medication Management: RN will administer medications as ordered by provider, will assess and evaluate patient's response and provide education to patient for prescribed medication. RN will report any adverse and/or side effects to prescribing provider.  Therapeutic Interventions: 1 on 1 counseling sessions, Psychoeducation, Medication administration, Evaluate responses to treatment, Monitor vital signs and CBGs as ordered,  Perform/monitor CIWA, COWS, AIMS and Fall Risk screenings as ordered, Perform wound care treatments as ordered.  Evaluation of Outcomes: Adequate for Discharge   LCSW Treatment Plan for Primary Diagnosis: MDD (major depressive disorder), recurrent severe, without psychosis ([place]) Long Term Goal(s): Safe transition to appropriate next level of care at discharge, Engage patient in therapeutic group addressing interpersonal concerns.  Short Term Goals: Engage patient in aftercare planning with referrals and resources, Increase social support, Increase emotional regulation, Identify triggers associated with mental health/substance abuse issues and Increase skills for wellness and recovery  Therapeutic Interventions: Assess for all discharge needs, 1 to 1 time with Social worker, Explore available resources and support systems, Assess for adequacy in community support network, Educate family and significant other(s) on suicide prevention, Complete Psychosocial Assessment, Interpersonal group therapy.  Evaluation of Outcomes: Adequate for Discharge   Progress in Treatment :  Attending groups: Yes  Participating in groups: Yes  Taking medication as prescribed: Yes, MD continuing to assess for appropriate medication regimen  Toleration medication: Yes  Family/Significant other contact made: No, patient has declined for collateral contact   Patient understands diagnosis: Yes  Discussing patient identified problems/goals with staff: Yes  Medical problems stabilized or resolved: Yes  Denies suicidal/homicidal ideation: Yes, denies  Issues/concerns per patient self-inventory: None reported  Other: N/A  New problem(s) identified: None reported at this time    New Short Term/Long Term Goal(s): None at this time    Discharge Plan or Barriers: Patient plans to discharge to [facility] as he is interested in [place] and [employer]. He will follow up with outpatient services.      Reason for Continuation of Hospitalization: Anxiety Depression Medication stabilization Suicidal Ideations Withdrawal symptoms  Estimated Length of Stay: Discharge anticipated for today 09/30/16    Attendees:  Patient:   Physician: Dr. Adele Schilder, MD  09/30/2016   9:30am  Nursing: Leanne Lovely, Carl Best 09/30/2016 9:30am  RN Care Manager:   Social Workers: Peri Maris, LCSW, Erasmo Downer Waylynn Benefiel, LCSW, [place], LCSW   09/30/2016 9:30am  Nurse Pratictioners: Samuel Jester, NP, Lindell Spar, NP 09/30/2016 9:30am   Tilden Fossa, LCSW Clinical Social Worker [facility] [phone]

## 2016-09-30 NOTE — [facility] Suicide Risk Assessment (Signed)
[facility] Discharge Suicide Risk Assessment   Principal Problem: MDD (major depressive disorder), recurrent severe, without psychosis ([place]) Discharge Diagnoses:  Patient Active Problem List   Diagnosis Date Noted  . Major depressive disorder, recurrent severe without psychotic features ([place]) [F33.2] 09/23/2016  . MDD (major depressive disorder), recurrent severe, without psychosis ([place]) [F33.2] 09/23/2016  . Cocaine abuse [F14.10] 12/21/2015  . Major depressive disorder, recurrent episode ([place]) [F33.9] 11/08/2015  . Alcohol dependence with uncomplicated withdrawal ([place]) [F10.230]   . Thyroid activity decreased [E03.9]   . Cocaine dependence with cocaine-induced mood disorder ([place]) [F14.24] 10/03/2015  . Alcohol dependence ([place]) [F10.20] 10/03/2015  . Hypothyroidism [E03.9] 10/03/2015    Total Time spent with patient: 30 minutes  Musculoskeletal: Strength & Muscle Tone: within normal limits Gait & Station: normal Patient leans: N/A  Psychiatric Specialty Exam: ROS  Blood pressure 139/83, pulse 66, temperature 98.5 F (36.9 C), temperature source Oral, resp. rate 18, height 6\' 5"  (1.956 m), weight 100.2 kg (221 lb), SpO2 100 %.Body mass index is 26.21 kg/m.  General Appearance: Casual  Eye Contact::  Good  Speech:  Clear and Coherent409  Volume:  Normal  Mood:  Anxious  Affect:  Appropriate and Congruent  Thought Process:  Goal Directed  Orientation:  Full (Time, Place, and Person)  Thought Content:  Logical  Suicidal Thoughts:  No  Homicidal Thoughts:  No  Memory:  Immediate;   Good Recent;   Good Remote;   Good  Judgement:  Good  Insight:  Good  Psychomotor Activity:  Normal  Concentration:  Good  Recall:  Good  Fund of Knowledge:Good  Language: Good  Akathisia:  No  Handed:  Right  AIMS (if indicated):     Assets:  Communication Skills Desire for Improvement Housing Physical Health Resilience Social Support  Sleep:  Number of Hours: 5  Cognition: WNL  ADL's:   Intact   Mental Status Per Nursing Assessment::   On Admission:     Demographic Factors:  NA  Loss Factors: Financial problems/change in socioeconomic status  Historical Factors: Prior suicide attempts and Impulsivity  Risk Reduction Factors:   Religious beliefs about death, Employed, Positive social support, Positive therapeutic relationship and Positive coping skills or problem solving skills  Continued Clinical Symptoms:  Alcohol/Substance Abuse/Dependencies Unstable or Poor Therapeutic Relationship Previous Psychiatric Diagnoses and Treatments  Cognitive Features That Contribute To Risk:  None    Suicide Risk:  Minimal: No identifiable suicidal ideation.  Patients presenting with no risk factors but with morbid ruminations; may be classified as minimal risk based on the severity of the depressive symptoms  Follow-up Information    [facility] Follow up.   Why:  Please go to walk-in clinic within 7 days of discharge Monday-Friday at 9am for assessment for therapy and medication management services.  Contact information: [address] [place], [place] [address] Phone: [phone]          Plan Of Care/Follow-up recommendations:  Activity:  As tolerated Diet:  Unchanged from the past  Please see complete discharge summary for more details.  Asanti Craigo T., MD 09/30/2016, 10:46 AM

## 2016-09-30 NOTE — Discharge Summary (Signed)
Physician Discharge Summary Note  Patient:  Johnathan Baker is an 45 y.o., male MRN:  [id number] DOB:  08-06-1971 Patient phone:  [phone] (home)  Patient address:   [place] [address],  Total Time spent with patient: Greater than 30 minutes  Date of Admission:  09/23/2016  Date of Discharge: 09-30-16  Reason for Admission: Suicidal ideations.  Principal Problem: MDD (major depressive disorder), recurrent severe, without psychosis [facility])  Discharge Diagnoses: Patient Active Problem List   Diagnosis Date Noted  . Major depressive disorder, recurrent severe without psychotic features ([place]) [F33.2] 09/23/2016  . MDD (major depressive disorder), recurrent severe, without psychosis ([place]) [F33.2] 09/23/2016  . Cocaine abuse [F14.10] 12/21/2015  . Major depressive disorder, recurrent episode ([place]) [F33.9] 11/08/2015  . Alcohol dependence with uncomplicated withdrawal ([place]) [F10.230]   . Thyroid activity decreased [E03.9]   . Cocaine dependence with cocaine-induced mood disorder ([place]) [F14.24] 10/03/2015  . Alcohol dependence ([place]) [F10.20] 10/03/2015  . Hypothyroidism [E03.9] 10/03/2015   Past Psychiatric History: Polysubstance dependence  Past Medical History:  Past Medical History:  Diagnosis Date  . Back pain   . Depression   . Hypertension   . Thyroid disease    History reviewed. No pertinent surgical history.  Family History: History reviewed. No pertinent family history.  Family Psychiatric  History: See H&P  Social History:  History  Alcohol Use  . Yes    Comment: binge drinks     History  Drug Use  . Types: Cocaine, Marijuana    Social History   Social History  . Marital status: Married    Spouse name: N/A  . Number of children: N/A  . Years of education: N/A   Social History Main Topics  . Smoking status: Current Every Day Smoker    Packs/day: 0.50    Types: Cigarettes  . Smokeless tobacco: Never Used  . Alcohol use Yes     Comment:  binge drinks  . Drug use:     Types: Cocaine, Marijuana  . Sexual activity: Yes   Other Topics Concern  . None   Social History Narrative  . None   Hospital Course: Patient reports that he thinks the genesis of his current problems is the relationship he has had with his wife for approximately the past 8 years. "She is causing me" to "want to hurt me, hurt her and do drugs." He states he feels safe here in the hospital and would not harm himself or others but states he does not feel he would be safe and would be at risk to harm self or others if released. He reports that he has been married for about 5 years and that initially the relationship went well until he began to suspect her of cheating and they began using drugs together. They "lost everything" and "we both lost her jobs" and they ended up homeless and needing to access social services  Johnathan Baker was admitted to the adult unit with a UDS results positive for cocaine & THC. He did admit to having been using drugs. He presented with suicidal ideations as well. He blamed his drug use on a relationship problems. He was in need of mood stabilization treatments & received medication managment. He was also enrolled & participated in the group counseling sessions being offered & held on this unit. He learned coping skills that should help him cope better & manage his substance abuse issues after discharge.   He was medicated & discharged on; Hydroxyzine 25 mg for anxiety,  Seroquel 75 mg for mood control, Gabapentin 300 mg for agitation & Trazodone 100 mg for insomnia. He was resumed on all his pertinent home medications for his other pre-existing medical issues that he presented. He tolerated his treatment regimen without any significant adverse effects or reactions reported.  Darin's mood is now stable. This is evidenced by his reports of improved mood & absence of substance withdrawal symptoms. He is being referred to [facility] for further substance  abuse treatments. Upon his hospital discharge, he adamantly denies any SIHI, AVH, delusional thoughts, paranoia & or substance withdrawal symptoms. He is currently being discharged to follow-up care as noted below. He is provided with all the pertinent information to make it to to this appointment without problems. He was provided with a 7 days worth supply samples of his [facility] discharge medications. He left [facility] with all personal belongings in no apparent distress. Transportation per city bus/family. [place] assisted with bus pass.  Physical Findings:  AIMS: Facial and Oral Movements Muscles of Facial Expression: None, normal Lips and Perioral Area: None, normal Jaw: None, normal Tongue: None, normal,Extremity Movements Upper (arms, wrists, hands, fingers): None, normal Lower (legs, knees, ankles, toes): None, normal, Trunk Movements Neck, shoulders, hips: None, normal, Overall Severity Severity of abnormal movements (highest score from questions above): None, normal Incapacitation due to abnormal movements: None, normal Patient's awareness of abnormal movements (rate only patient's report): No Awareness, Dental Status Current problems with teeth and/or dentures?: No Does patient usually wear dentures?: No  CIWA:  CIWA-Ar Total: 1 COWS:  COWS Total Score: 1  Musculoskeletal: Strength & Muscle Tone: within normal limits Gait & Station: normal Patient leans: N/A  Psychiatric Specialty Exam: Review of Systems  Constitutional: Negative.   HENT: Negative.   Eyes: Negative.   Respiratory: Negative.   Cardiovascular: Negative.   Gastrointestinal: Negative.   Genitourinary: Negative.   Musculoskeletal: Negative.   Skin: Negative.   Neurological: Negative.   Psychiatric/Behavioral: Positive for depression (Stable) and substance abuse (Alcohol/cocaine dependence). Negative for hallucinations, memory loss and suicidal ideas. The patient has insomnia (Stable). The patient is not nervous/anxious.      Blood pressure 139/83, pulse 66, temperature 98.5 F (36.9 C), temperature source Oral, resp. rate 18, height 6\' 5"  (1.956 m), weight 100.2 kg (221 lb), SpO2 100 %.Body mass index is 26.21 kg/m.  See Md's SRA   Have you used any form of tobacco in the last 30 days? (Cigarettes, Smokeless Tobacco, Cigars, and/or Pipes): Yes  Has this patient used any form of tobacco in the last 30 days? (Cigarettes, Smokeless Tobacco, Cigars, and/or Pipes) Yes, Yes, A prescription for an FDA-approved tobacco cessation medication was offered at discharge and the patient refused  Metabolic Disorder Labs:  Lab Results  Component Value Date   HGBA1C 4.8 09/28/2016   MPG 91 09/28/2016   MPG 94 09/27/2016   No results found for: PROLACTIN Lab Results  Component Value Date   CHOL 152 09/27/2016   TRIG 111 09/27/2016   HDL 65 09/27/2016   CHOLHDL 2.3 09/27/2016   VLDL 22 09/27/2016   LDLCALC 65 09/27/2016   LDLCALC 73 02/11/2014   See Psychiatric Specialty Exam and Suicide Risk Assessment completed by Attending Physician prior to discharge.  Discharge destination:  Home  Is patient on multiple antipsychotic therapies at discharge:  No   Has Patient had three or more failed trials of antipsychotic monotherapy by history:  No  Recommended Plan for Multiple Antipsychotic Therapies: NA    Medication List  STOP taking these medications   cyclobenzaprine 10 MG tablet Commonly known as:  FLEXERIL     TAKE these medications     Indication  diclofenac sodium 1 % Gel Commonly known as:  VOLTAREN Apply 2 g topically 4 (four) times daily as needed (right knee pain).  Indication:  Joint Damage causing Pain and Loss of Function   gabapentin 300 MG capsule Commonly known as:  NEURONTIN Take 1 capsule (300 mg total) by mouth 3 (three) times daily. For agitation What changed:  additional instructions  Another medication with the same name was removed. Continue taking this medication, and follow  the directions you see here.  Indication:  Agitation   hydrOXYzine 25 MG tablet Commonly known as:  ATARAX/VISTARIL Take 1 tablet (25 mg total) by mouth every 6 (six) hours as needed for anxiety. What changed:  Another medication with the same name was removed. Continue taking this medication, and follow the directions you see here.  Indication:  Anxiety Neurosis, Tension, Anxiety   levothyroxine 100 MCG tablet Commonly known as:  SYNTHROID, LEVOTHROID Take 1 tablet (100 mcg total) by mouth daily before breakfast. For low functioning Thyroid gland What changed:  additional instructions  Another medication with the same name was removed. Continue taking this medication, and follow the directions you see here.  Indication:  Underactive Thyroid   MUSCLE RUB 10-15 % Crea Apply 1 application topically 4 (four) times daily as needed for muscle pain (joint pain).  Indication:  Joint pain   nicotine polacrilex 2 MG gum Commonly known as:  NICORETTE Take 1 each (2 mg total) by mouth as needed for smoking cessation.  Indication:  Nicotine Addiction   QUEtiapine 25 MG tablet Commonly known as:  SEROQUEL Take 3 tablets (75 mg total) by mouth at bedtime. For mood control What changed:  medication strength  how much to take  additional instructions  Another medication with the same name was removed. Continue taking this medication, and follow the directions you see here.  Indication:  Mood control   traZODone 100 MG tablet Commonly known as:  DESYREL Take 1 tablet (100 mg total) by mouth at bedtime as needed for sleep. What changed:  Another medication with the same name was removed. Continue taking this medication, and follow the directions you see here.  Indication:  Trouble Sleeping      Follow-up Information    [facility] Follow up.   Why:  Please go to walk-in clinic within 7 days of discharge Monday-Friday at 9am for assessment for therapy and medication management  services.  Contact information: [address] [place], [place] [address] Phone: [phone]         Follow-up recommendations:  Activity:  As tolerated Diet: As recommended by your primary care doctor. Keep all scheduled follow-up appointments as recommended.  Comments: Patient is instructed prior to discharge to: Take all medications as prescribed by his/her mental healthcare provider. Report any adverse effects and or reactions from the medicines to his/her outpatient provider promptly. Patient has been instructed & cautioned: To not engage in alcohol and or illegal drug use while on prescription medicines. In the event of worsening symptoms, patient is instructed to call the crisis hotline, 911 and or go to the nearest ED for appropriate evaluation and treatment of symptoms. To follow-up with his/her primary care provider for your other medical issues, concerns and or health care needs.   Signed: Encarnacion Slates, PMHNP, FNP-BC 09/30/2016, 11:10 AM

## 2016-10-28 ENCOUNTER — Emergency Department (HOSPITAL_COMMUNITY)
Admission: EM | Admit: 2016-10-28 | Discharge: 2016-10-28 | Disposition: A | Discharge status: Home / Self Care | Payer: BLUE CROSS/BLUE SHIELD | Attending: Emergency Medicine | Admitting: Emergency Medicine

## 2016-10-28 ENCOUNTER — Encounter (HOSPITAL_COMMUNITY): Payer: Self-pay | Admitting: Emergency Medicine

## 2016-10-28 DIAGNOSIS — F1424 Cocaine dependence with cocaine-induced mood disorder: Secondary | ICD-10-CM

## 2016-10-28 DIAGNOSIS — E039 Hypothyroidism, unspecified: Secondary | ICD-10-CM | POA: Insufficient documentation

## 2016-10-28 DIAGNOSIS — F17210 Nicotine dependence, cigarettes, uncomplicated: Secondary | ICD-10-CM | POA: Insufficient documentation

## 2016-10-28 DIAGNOSIS — Z79899 Other long term (current) drug therapy: Secondary | ICD-10-CM | POA: Insufficient documentation

## 2016-10-28 DIAGNOSIS — I10 Essential (primary) hypertension: Secondary | ICD-10-CM | POA: Insufficient documentation

## 2016-10-28 LAB — COMPREHENSIVE METABOLIC PANEL
ALT: 25 U/L (ref 17–63)
AST: 44 U/L — AB (ref 15–41)
Albumin: 4.3 g/dL (ref 3.5–5.0)
Alkaline Phosphatase: 63 U/L (ref 38–126)
Anion gap: 10 (ref 5–15)
BUN: 16 mg/dL (ref 6–20)
CHLORIDE: 106 mmol/L (ref 101–111)
CO2: 21 mmol/L — AB (ref 22–32)
CREATININE: 1.12 mg/dL (ref 0.61–1.24)
Calcium: 9.4 mg/dL (ref 8.9–10.3)
Glucose, Bld: 121 mg/dL — ABNORMAL HIGH (ref 65–99)
Potassium: 3.7 mmol/L (ref 3.5–5.1)
SODIUM: 137 mmol/L (ref 135–145)
Total Bilirubin: 0.5 mg/dL (ref 0.3–1.2)
Total Protein: 7.3 g/dL (ref 6.5–8.1)

## 2016-10-28 LAB — CBC
HCT: 36 % — ABNORMAL LOW (ref 39.0–52.0)
HEMOGLOBIN: 12.6 g/dL — AB (ref 13.0–17.0)
MCH: 34.7 pg — AB (ref 26.0–34.0)
MCHC: 35 g/dL (ref 30.0–36.0)
MCV: 99.2 fL (ref 78.0–100.0)
PLATELETS: 247 10*3/uL (ref 150–400)
RBC: 3.63 MIL/uL — AB (ref 4.22–5.81)
RDW: 12.9 % (ref 11.5–15.5)
WBC: 7.9 10*3/uL (ref 4.0–10.5)

## 2016-10-28 LAB — SALICYLATE LEVEL

## 2016-10-28 LAB — ETHANOL

## 2016-10-28 LAB — ACETAMINOPHEN LEVEL: Acetaminophen (Tylenol), Serum: <10 ug/mL — ABNORMAL LOW (ref 10–30)

## 2016-10-28 MED ORDER — LEVOTHYROXINE SODIUM 100 MCG PO TABS
100.0000 ug | ORAL_TABLET | Freq: Every day | ORAL | Status: DC
  Filled 2016-10-28: qty 1

## 2016-10-28 MED ORDER — MUSCLE RUB 10-15 % EX CREA
1.0000 "application " | TOPICAL_CREAM | Freq: Four times a day (QID) | CUTANEOUS | Status: DC | PRN
  Filled 2016-10-28: qty 85

## 2016-10-28 MED ORDER — NICOTINE POLACRILEX 2 MG MT GUM: 2.0000 mg | CHEWING_GUM | OROMUCOSAL | Status: DC | PRN

## 2016-10-28 MED ORDER — GABAPENTIN 300 MG PO CAPS
300.0000 mg | ORAL_CAPSULE | Freq: Three times a day (TID) | ORAL | Status: DC
  Administered 2016-10-28: 300 mg via ORAL
  Filled 2016-10-28: qty 1

## 2016-10-28 MED ORDER — QUETIAPINE FUMARATE 50 MG PO TABS: 75.0000 mg | ORAL_TABLET | Freq: Every day | ORAL | Status: DC

## 2016-10-28 MED ORDER — TRAZODONE HCL 100 MG PO TABS: 100.0000 mg | ORAL_TABLET | Freq: Every evening | ORAL | Status: DC | PRN

## 2016-10-28 MED ORDER — HYDROXYZINE HCL 25 MG PO TABS: 25.0000 mg | ORAL_TABLET | Freq: Four times a day (QID) | ORAL | Status: DC | PRN
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
## 2016-10-28 NOTE — ED Provider Notes (Signed)
[facility] DEPT Provider Note   CSN: [id number] Arrival date & time: 10/28/16  0542     History   Chief Complaint Chief Complaint  Patient presents with  . Suicidal    HPI Johnathan Baker is a 45 y.o. male.  HPI Patient presents to the emergency room with complaints of depression and suicidal ideation.  Patient has a history of substance abuse and depression. He was in the hospital last month for similar symptoms. Patient states he was feeling better after that treatment last month. He says he has been going to group sessions but has not followed up with a psychiatrist. His symptoms started to return last evening he was feeling more depressed and was having thoughts of suicide. He had no specific plan. Patient does admit to continued alcohol and drug use including use last evening. He does feel like that sometimes exacerbates his symptoms. He complains of some lower body aches but otherwise no other complaints of chest pain, shortness of breath fevers, chills or other medical concerns. Past Medical History:  Diagnosis Date  . Back pain   . Depression   . Hypertension   . Thyroid disease     Patient Active Problem List   Diagnosis Date Noted  . Major depressive disorder, recurrent severe without psychotic features ([place]) 09/23/2016  . MDD (major depressive disorder), recurrent severe, without psychosis ([place]) 09/23/2016  . Cocaine abuse 12/21/2015  . Major depressive disorder, recurrent episode ([place]) 11/08/2015  . Alcohol dependence with uncomplicated withdrawal ([place])   . Thyroid activity decreased   . Cocaine dependence with cocaine-induced mood disorder ([place]) 10/03/2015  . Alcohol dependence ([place]) 10/03/2015  . Hypothyroidism 10/03/2015    History reviewed. No pertinent surgical history.     Home Medications    Prior to Admission medications   Medication Sig Start Date End Date Taking? Authorizing Provider  diclofenac sodium (VOLTAREN) 1 % GEL Apply 2 g topically 4  (four) times daily as needed (right knee pain). 09/30/16   Encarnacion Slates, NP  gabapentin (NEURONTIN) 300 MG capsule Take 1 capsule (300 mg total) by mouth 3 (three) times daily. For agitation 09/30/16   Encarnacion Slates, NP  hydrOXYzine (ATARAX/VISTARIL) 25 MG tablet Take 1 tablet (25 mg total) by mouth every 6 (six) hours as needed for anxiety. 09/30/16   Encarnacion Slates, NP  levothyroxine (SYNTHROID, LEVOTHROID) 100 MCG tablet Take 1 tablet (100 mcg total) by mouth daily before breakfast. For low functioning Thyroid gland 09/30/16   Encarnacion Slates, NP  levothyroxine (SYNTHROID, LEVOTHROID) 100 MCG tablet Take 1 tablet (100 mcg total) by mouth daily before breakfast. 09/30/16   Kerrie Buffalo, NP  Menthol-Methyl Salicylate (MUSCLE RUB) 10-15 % CREA Apply 1 application topically 4 (four) times daily as needed for muscle pain (joint pain). 09/30/16   Encarnacion Slates, NP  nicotine polacrilex (NICORETTE) 2 MG gum Take 1 each (2 mg total) by mouth as needed for smoking cessation. 09/30/16   Encarnacion Slates, NP  QUEtiapine (SEROQUEL) 25 MG tablet Take 3 tablets (75 mg total) by mouth at bedtime. For mood control 09/30/16   Encarnacion Slates, NP  traZODone (DESYREL) 100 MG tablet Take 1 tablet (100 mg total) by mouth at bedtime as needed for sleep. 09/30/16   Encarnacion Slates, NP    Family History No family history on file.  Social History Social History  Substance Use Topics  . Smoking status: Current Every Day Smoker    Packs/day: 0.50  Types: Cigarettes  . Smokeless tobacco: Never Used  . Alcohol use Yes     Comment: binge drinks     Allergies   Patient has no known allergies.   Review of Systems Review of Systems   Physical Exam Updated Vital Signs BP 111/81 (BP Location: Left Arm)   Pulse 73   Temp 97.7 F (36.5 C) (Oral)   Resp 18   SpO2 99%   Physical Exam  Constitutional: He appears well-developed and well-nourished. No distress.  HENT:  Head: Normocephalic and atraumatic.  Right  Ear: External ear normal.  Left Ear: External ear normal.  Eyes: Conjunctivae are normal. Right eye exhibits no discharge. Left eye exhibits no discharge. No scleral icterus.  Neck: Neck supple. No tracheal deviation present.  Cardiovascular: Normal rate, regular rhythm and intact distal pulses.   Pulmonary/Chest: Effort normal and breath sounds normal. No stridor. No respiratory distress. He has no wheezes. He has no rales.  Abdominal: Soft. Bowel sounds are normal. He exhibits no distension. There is no tenderness. There is no rebound and no guarding.  Musculoskeletal: He exhibits no edema or tenderness.  Neurological: He is alert. He has normal strength. No cranial nerve deficit (no facial droop, extraocular movements intact, no slurred speech) or sensory deficit. He exhibits normal muscle tone. He displays no seizure activity. Coordination normal.  Skin: Skin is warm and dry. No rash noted. He is not diaphoretic.  Psychiatric: His speech is not delayed, not tangential and not slurred. He is not aggressive, not hyperactive, not withdrawn and not combative. He exhibits a depressed mood. He expresses suicidal ideation. He is communicative.  Nursing note and vitals reviewed.    ED Treatments / Results  Labs (all labs ordered are listed, but only abnormal results are displayed) Labs Reviewed  COMPREHENSIVE METABOLIC PANEL - Abnormal; Notable for the following:       Result Value   CO2 21 (*)    Glucose, Bld 121 (*)    AST 44 (*)    All other components within normal limits  ACETAMINOPHEN LEVEL - Abnormal; Notable for the following:    Acetaminophen (Tylenol), Serum <10 (*)    All other components within normal limits  CBC - Abnormal; Notable for the following:    RBC 3.63 (*)    Hemoglobin 12.6 (*)    HCT 36.0 (*)    MCH 34.7 (*)    All other components within normal limits  ETHANOL  SALICYLATE LEVEL  Procedures Procedures (including critical care time)  Medications Ordered in  ED Medications - No data to display   Initial Impression / Assessment and Plan / ED Course  I have reviewed the triage vital signs and the nursing notes.  Pertinent labs & imaging results that were available during my care of the patient were reviewed by me and considered in my medical decision making (see chart for details).  Clinical Course as of Oct 29 1603  Fri Oct 28, 2016  0725 Labs reviewed.  No acute concerns. Medically clear for psych evaluation  [JK]    Clinical Course User Index [JK] Dorie Rank, MD    Pt was evaluated by psychiatry.  Low risk for self harm. Dc home with outpatient follow up.  Final Clinical Impressions(s) / ED Diagnoses   Final diagnoses:  Cocaine dependence with cocaine-induced mood disorder [facility])    New Prescriptions Discharge Medication List as of 10/28/2016 11:18 AM       Dorie Rank, MD 10/28/16 [phone]

## 2016-10-28 NOTE — Progress Notes (Signed)
Entered in d/c instructions Please use the resources provided to you by the ED case manager to assist with finding a follow provider      Johnathan Baker will continue to see you at family services and assist with medications as needed

## 2016-10-28 NOTE — Progress Notes (Signed)
Pt confirms with ED CM that his pcp is maryann placey Reports he last saw Maryan on 10/27/16  ED CM left pt uninsured [place] resources in his locker #[phone]  CM provided and discussed written information to assist pt with determining choice for uninsured accepting pcps, discussed the importance of pcp vs EDP services for f/u care, www.needymeds.org, www.goodrx.com, discounted pharmacies and other [employer] such as [employer], affordable care act, financial assistance, uninsured dental services, [place] med assist, DSS and  health department  Provided resources for [employer] uninsured accepting pcps like [facility], family medicine at [employer], [facility], [facility], local urgent care centers, [facility], [facility] family practice, general medical clinics, family services of the [place], [facility] urgent care plus others, medication resources, [facility] out patient pharmacies and housing Provided [employer]

## 2016-10-28 NOTE — ED Triage Notes (Signed)
Pt brought to ED via police escort.  Pt called 911 from the [facility] on [facility] stating that he wanted to kill himself.  Pt states that 'I am not a danger to myself or others' but that he has had ideations about suicide.  Denies AVH or HI.  Denies having any specific plan.  Reports feeling anxious and hopeless.  Denies any recent loss in his support system.  Reports feeling 'like something is following me or around me, like I'm going to look down the hall and see something'.  A&Ox4 with steady gait.  Calm and cooperative.  Hx depression,

## 2016-10-28 NOTE — ED Notes (Signed)
Patient understood discharge instructions.  He is A & O x4.

## 2016-10-28 NOTE — [facility] Suicide Risk Assessment (Signed)
Suicide Risk Assessment  Discharge Assessment   [facility] Discharge Suicide Risk Assessment   Principal Problem: <principal problem not specified> Discharge Diagnoses:  Patient Active Problem List   Diagnosis Date Noted  . Major depressive disorder, recurrent severe without psychotic features ([place]) [F33.2] 09/23/2016    Priority: High  . Cocaine abuse [F14.10] 12/21/2015    Priority: High  . Cocaine dependence with cocaine-induced mood disorder [facility]) [F14.24] 10/03/2015    Priority: High  . MDD (major depressive disorder), recurrent severe, without psychosis ([place]) [F33.2] 09/23/2016  . Major depressive disorder, recurrent episode ([place]) [F33.9] 11/08/2015  . Alcohol dependence with uncomplicated withdrawal ([place]) [F10.230]   . Thyroid activity decreased [E03.9]   . Alcohol dependence ([place]) [F10.20] 10/03/2015  . Hypothyroidism [E03.9] 10/03/2015    Total Time spent with patient: 45 minutes  Musculoskeletal: Strength & Muscle Tone: within normal limits Gait & Station: normal Patient leans: N/A  Psychiatric Specialty Exam:   Blood pressure 140/86, pulse 78, temperature 98.1 F (36.7 C), temperature source Oral, resp. rate 16, SpO2 99 %.There is no height or weight on file to calculate BMI.  General Appearance: Casual  Eye Contact::  Good  Speech:  Normal Rate  Volume:  Normal  Mood:  Irritable   Affect:  Congruent  Thought Process:  Coherent and Descriptions of Associations: Intact  Orientation:  Full (Time, Place, and Person)  Thought Content:  WDL  Suicidal Thoughts:  No  Homicidal Thoughts:  No  Memory:  Immediate;   Fair Recent;   Fair Remote;   Fair  Judgement:  Fair  Insight:  Fair  Psychomotor Activity:  Normal  Concentration:  Fair  Recall:  [employer] of Knowledge:Fair  Language: Good  Akathisia:  No  Handed:  Right  AIMS (if indicated):     Assets:  Leisure Time Physical Health Resilience  Sleep:     Cognition: WNL  ADL's:  Intact   Mental Status Per  Nursing Assessment::   On Admission:   cocaine abuse  Demographic Factors:  Male  Loss Factors: NA  Historical Factors: NA  Risk Reduction Factors:   Sense of responsibility to family  Continued Clinical Symptoms:  Irritability   Cognitive Features That Contribute To Risk:  None    Suicide Risk:  Minimal: No identifiable suicidal ideation.  Patients presenting with no risk factors but with morbid ruminations; may be classified as minimal risk based on the severity of the depressive symptoms    Plan Of Care/Follow-up recommendations:  Activity:  as tolerated Diet:  heart healthy diet  LORD, JAMISON, NP 10/28/2016, 10:28 AM

## 2016-10-29 ENCOUNTER — Encounter (HOSPITAL_COMMUNITY): Payer: Self-pay

## 2016-10-29 ENCOUNTER — Emergency Department (HOSPITAL_COMMUNITY)
Admission: EM | Admit: 2016-10-29 | Discharge: 2016-10-29 | Disposition: A | Discharge status: Home / Self Care | Payer: BLUE CROSS/BLUE SHIELD | Attending: Emergency Medicine | Admitting: Emergency Medicine

## 2016-10-29 DIAGNOSIS — F17210 Nicotine dependence, cigarettes, uncomplicated: Secondary | ICD-10-CM | POA: Insufficient documentation

## 2016-10-29 DIAGNOSIS — Z79899 Other long term (current) drug therapy: Secondary | ICD-10-CM | POA: Insufficient documentation

## 2016-10-29 DIAGNOSIS — I10 Essential (primary) hypertension: Secondary | ICD-10-CM | POA: Insufficient documentation

## 2016-10-29 DIAGNOSIS — F1410 Cocaine abuse, uncomplicated: Secondary | ICD-10-CM | POA: Insufficient documentation

## 2016-10-29 DIAGNOSIS — E039 Hypothyroidism, unspecified: Secondary | ICD-10-CM | POA: Diagnosis not present

## 2016-10-29 DIAGNOSIS — R45851 Suicidal ideations: Secondary | ICD-10-CM | POA: Diagnosis present

## 2016-10-29 LAB — COMPREHENSIVE METABOLIC PANEL
ALT: 24 U/L (ref 17–63)
AST: 33 U/L (ref 15–41)
Albumin: 4.3 g/dL (ref 3.5–5.0)
Alkaline Phosphatase: 73 U/L (ref 38–126)
Anion gap: 8 (ref 5–15)
BUN: 16 mg/dL (ref 6–20)
CHLORIDE: 105 mmol/L (ref 101–111)
CO2: 25 mmol/L (ref 22–32)
CREATININE: 1.33 mg/dL — AB (ref 0.61–1.24)
Calcium: 9.2 mg/dL (ref 8.9–10.3)
GFR calc non Af Amer: >60 mL/min (ref >60)
Glucose, Bld: 79 mg/dL (ref 65–99)
POTASSIUM: 4.4 mmol/L (ref 3.5–5.1)
SODIUM: 138 mmol/L (ref 135–145)
Total Bilirubin: 0.5 mg/dL (ref 0.3–1.2)
Total Protein: 7.5 g/dL (ref 6.5–8.1)

## 2016-10-29 LAB — RAPID URINE DRUG SCREEN, HOSP PERFORMED
AMPHETAMINES: NOT DETECTED
BENZODIAZEPINES: NOT DETECTED
Barbiturates: NOT DETECTED
COCAINE: POSITIVE — AB
OPIATES: NOT DETECTED
TETRAHYDROCANNABINOL: POSITIVE — AB

## 2016-10-29 LAB — CBC
HCT: 40.9 % (ref 39.0–52.0)
HEMOGLOBIN: 13.4 g/dL (ref 13.0–17.0)
MCH: 33.8 pg (ref 26.0–34.0)
MCHC: 32.8 g/dL (ref 30.0–36.0)
MCV: 103.3 fL — ABNORMAL HIGH (ref 78.0–100.0)
Platelets: 267 10*3/uL (ref 150–400)
RBC: 3.96 MIL/uL — AB (ref 4.22–5.81)
RDW: 13.2 % (ref 11.5–15.5)
WBC: 4.6 10*3/uL (ref 4.0–10.5)

## 2016-10-29 LAB — ACETAMINOPHEN LEVEL: Acetaminophen (Tylenol), Serum: <10 ug/mL — ABNORMAL LOW (ref 10–30)

## 2016-10-29 LAB — SALICYLATE LEVEL

## 2016-10-29 LAB — ETHANOL: Alcohol, Ethyl (B): <5 mg/dL (ref <5)

## 2016-10-29 NOTE — ED Notes (Signed)
Bed: WTR5 Expected date:  Expected time:  Means of arrival:  Comments: 

## 2016-10-29 NOTE — ED Provider Notes (Addendum)
[facility] DEPT Provider Note   CSN: [id number] Arrival date & time: 10/29/16  0753     History   Chief Complaint Chief Complaint  Patient presents with  . IVC  . Medical Clearance    HPI Johnathan Baker is a 45 y.o. male.  45 year old male with history of polysubstance abuse as well as depression presents from [place] due to suicidal ideations. Was sent here for medical clearance. Patient endorses increased paranoia. Denies responding to internal stimuli. No intentional ingestions. Denies any homicidal ideations. Has chronic pain without any new injuries. Denies any new neurological symptoms. Presents via [employer].      Past Medical History:  Diagnosis Date  . Back pain   . Depression   . Hypertension   . Thyroid disease     Patient Active Problem List   Diagnosis Date Noted  . Major depressive disorder, recurrent severe without psychotic features ([place]) 09/23/2016  . MDD (major depressive disorder), recurrent severe, without psychosis ([place]) 09/23/2016  . Cocaine abuse 12/21/2015  . Major depressive disorder, recurrent episode ([place]) 11/08/2015  . Alcohol dependence with uncomplicated withdrawal ([place])   . Thyroid activity decreased   . Cocaine dependence with cocaine-induced mood disorder ([place]) 10/03/2015  . Alcohol dependence ([place]) 10/03/2015  . Hypothyroidism 10/03/2015    History reviewed. No pertinent surgical history.     Home Medications    Prior to Admission medications   Medication Sig Start Date End Date Taking? Authorizing Provider  diclofenac sodium (VOLTAREN) 1 % GEL Apply 2 g topically 4 (four) times daily as needed (right knee pain). 09/30/16   Encarnacion Slates, NP  gabapentin (NEURONTIN) 300 MG capsule Take 1 capsule (300 mg total) by mouth 3 (three) times daily. For agitation 09/30/16   Encarnacion Slates, NP  hydrOXYzine (ATARAX/VISTARIL) 25 MG tablet Take 1 tablet (25 mg total) by mouth every 6 (six) hours as needed for anxiety. 09/30/16   Encarnacion Slates,  NP  levothyroxine (SYNTHROID, LEVOTHROID) 100 MCG tablet Take 1 tablet (100 mcg total) by mouth daily before breakfast. For low functioning Thyroid gland 09/30/16   Encarnacion Slates, NP  levothyroxine (SYNTHROID, LEVOTHROID) 100 MCG tablet Take 1 tablet (100 mcg total) by mouth daily before breakfast. 09/30/16   Kerrie Buffalo, NP  Menthol-Methyl Salicylate (MUSCLE RUB) 10-15 % CREA Apply 1 application topically 4 (four) times daily as needed for muscle pain (joint pain). 09/30/16   Encarnacion Slates, NP  nicotine polacrilex (NICORETTE) 2 MG gum Take 1 each (2 mg total) by mouth as needed for smoking cessation. 09/30/16   Encarnacion Slates, NP  QUEtiapine (SEROQUEL) 25 MG tablet Take 3 tablets (75 mg total) by mouth at bedtime. For mood control 09/30/16   Encarnacion Slates, NP  traZODone (DESYREL) 100 MG tablet Take 1 tablet (100 mg total) by mouth at bedtime as needed for sleep. 09/30/16   Encarnacion Slates, NP    Family History History reviewed. No pertinent family history.  Social History Social History  Substance Use Topics  . Smoking status: Current Every Day Smoker    Packs/day: 0.50    Types: Cigarettes  . Smokeless tobacco: Never Used  . Alcohol use Yes     Comment: binge drinks     Allergies   Patient has no known allergies.   Review of Systems Review of Systems  All other systems reviewed and are negative.    Physical Exam Updated Vital Signs BP 134/90 (BP Location: Right Arm)   Pulse Marland Kitchen)  59   Temp 97.8 F (36.6 C) (Oral)   Resp 16   SpO2 99%   Physical Exam  Constitutional: He is oriented to person, place, and time. He appears well-developed and well-nourished.  Non-toxic appearance. No distress.  HENT:  Head: Normocephalic and atraumatic.  Eyes: Conjunctivae, EOM and lids are normal. Pupils are equal, round, and reactive to light.  Neck: Normal range of motion. Neck supple. No tracheal deviation present. No thyroid mass present.  Cardiovascular: Normal rate, regular rhythm  and normal heart sounds.  Exam reveals no gallop.   No murmur heard. Pulmonary/Chest: Effort normal and breath sounds normal. No stridor. No respiratory distress. He has no decreased breath sounds. He has no wheezes. He has no rhonchi. He has no rales.  Abdominal: Soft. Normal appearance and bowel sounds are normal. He exhibits no distension. There is no tenderness. There is no rebound and no CVA tenderness.  Musculoskeletal: Normal range of motion. He exhibits no edema or tenderness.  Neurological: He is alert and oriented to person, place, and time. He has normal strength. No cranial nerve deficit or sensory deficit. GCS eye subscore is 4. GCS verbal subscore is 5. GCS motor subscore is 6.  Skin: Skin is warm and dry. No abrasion and no rash noted.  Psychiatric: His speech is normal. His affect is blunt. He is withdrawn. Thought content is paranoid.  Nursing note and vitals reviewed.    ED Treatments / Results  Labs (all labs ordered are listed, but only abnormal results are displayed) Labs Reviewed  COMPREHENSIVE METABOLIC PANEL  ETHANOL  SALICYLATE LEVEL  ACETAMINOPHEN LEVEL  CBC  RAPID URINE DRUG SCREEN, HOSP PERFORMED    EKG  EKG Interpretation None       Radiology No results found.  Procedures Procedures (including critical care time)  Medications Ordered in ED Medications - No data to display   Initial Impression / Assessment and Plan / ED Course  I have reviewed the triage vital signs and the nursing notes.  Pertinent labs & imaging results that were available during my care of the patient were reviewed by me and considered in my medical decision making (see chart for details).  Clinical Course     Patient to be medically cleared and then transported back to [facility] for disposition  Final Clinical Impressions(s) / ED Diagnoses   Final diagnoses:  None   10:01 AM Patient's creatinine noted and likely from dehydration. Encouraged to consume liquids and  patient is medically cleared New Prescriptions New Prescriptions   No medications on file     Lacretia Leigh, MD 10/29/16 [id number]    Lacretia Leigh, MD 10/29/16 1001

## 2016-10-29 NOTE — ED Triage Notes (Addendum)
Pt presents w/ [employer] for medical clearance.  Pt c/o chronic leg pain r/t back injuries.  Pain score 7/10.  Pt reports "I was over here and they let me go.  I went to [facility], because I felt like I need something further."  Denies SI/HI/AVH.    IVC paperwork sts "increased paranoia and cocaine use."  Per [employer], Pt can return to [facility].

## 2016-10-29 NOTE — ED Notes (Signed)
Results faxed to [facility] and spoke w/ Crystal.  Per Zenia Resides MD, we will not be performing the TSH.  Copy of results and d/c paperwork sent w/ [employer].

## 2016-11-05 ENCOUNTER — Emergency Department (HOSPITAL_COMMUNITY): Payer: BLUE CROSS/BLUE SHIELD

## 2016-11-05 ENCOUNTER — Inpatient Hospital Stay (HOSPITAL_COMMUNITY)
Admission: EM | Admit: 2016-11-05 | Discharge: 2016-11-07 | DRG: 918 | Disposition: A | Discharge status: Home / Self Care | Payer: BLUE CROSS/BLUE SHIELD | Attending: Internal Medicine | Admitting: Internal Medicine

## 2016-11-05 ENCOUNTER — Encounter (HOSPITAL_COMMUNITY): Payer: Self-pay | Admitting: Oncology

## 2016-11-05 DIAGNOSIS — F329 Major depressive disorder, single episode, unspecified: Secondary | ICD-10-CM

## 2016-11-05 DIAGNOSIS — E039 Hypothyroidism, unspecified: Secondary | ICD-10-CM | POA: Diagnosis present

## 2016-11-05 DIAGNOSIS — Y929 Unspecified place or not applicable: Secondary | ICD-10-CM

## 2016-11-05 DIAGNOSIS — Z79899 Other long term (current) drug therapy: Secondary | ICD-10-CM | POA: Diagnosis not present

## 2016-11-05 DIAGNOSIS — T426X2A Poisoning by other antiepileptic and sedative-hypnotic drugs, intentional self-harm, initial encounter: Secondary | ICD-10-CM | POA: Diagnosis present

## 2016-11-05 DIAGNOSIS — T43592A Poisoning by other antipsychotics and neuroleptics, intentional self-harm, initial encounter: Secondary | ICD-10-CM | POA: Diagnosis not present

## 2016-11-05 DIAGNOSIS — I4581 Long QT syndrome: Secondary | ICD-10-CM | POA: Diagnosis present

## 2016-11-05 DIAGNOSIS — R45851 Suicidal ideations: Secondary | ICD-10-CM | POA: Diagnosis present

## 2016-11-05 DIAGNOSIS — T43212A Poisoning by selective serotonin and norepinephrine reuptake inhibitors, intentional self-harm, initial encounter: Secondary | ICD-10-CM | POA: Diagnosis present

## 2016-11-05 DIAGNOSIS — I959 Hypotension, unspecified: Secondary | ICD-10-CM | POA: Diagnosis present

## 2016-11-05 DIAGNOSIS — F32A Depression, unspecified: Secondary | ICD-10-CM

## 2016-11-05 DIAGNOSIS — Z590 Homelessness: Secondary | ICD-10-CM

## 2016-11-05 DIAGNOSIS — N182 Chronic kidney disease, stage 2 (mild): Secondary | ICD-10-CM | POA: Diagnosis present

## 2016-11-05 DIAGNOSIS — F17210 Nicotine dependence, cigarettes, uncomplicated: Secondary | ICD-10-CM | POA: Diagnosis not present

## 2016-11-05 DIAGNOSIS — F332 Major depressive disorder, recurrent severe without psychotic features: Secondary | ICD-10-CM | POA: Diagnosis present

## 2016-11-05 DIAGNOSIS — F1910 Other psychoactive substance abuse, uncomplicated: Secondary | ICD-10-CM

## 2016-11-05 DIAGNOSIS — I129 Hypertensive chronic kidney disease with stage 1 through stage 4 chronic kidney disease, or unspecified chronic kidney disease: Secondary | ICD-10-CM | POA: Diagnosis present

## 2016-11-05 DIAGNOSIS — F1210 Cannabis abuse, uncomplicated: Secondary | ICD-10-CM | POA: Diagnosis present

## 2016-11-05 DIAGNOSIS — D7589 Other specified diseases of blood and blood-forming organs: Secondary | ICD-10-CM | POA: Diagnosis present

## 2016-11-05 DIAGNOSIS — N179 Acute kidney failure, unspecified: Secondary | ICD-10-CM | POA: Diagnosis present

## 2016-11-05 DIAGNOSIS — F1410 Cocaine abuse, uncomplicated: Secondary | ICD-10-CM | POA: Diagnosis present

## 2016-11-05 LAB — URINALYSIS, ROUTINE W REFLEX MICROSCOPIC
BILIRUBIN URINE: NEGATIVE
Glucose, UA: NEGATIVE mg/dL
HGB URINE DIPSTICK: NEGATIVE
KETONES UR: NEGATIVE mg/dL
Leukocytes, UA: NEGATIVE
Nitrite: NEGATIVE
PROTEIN: 30 mg/dL — AB
Specific Gravity, Urine: 1.016 (ref 1.005–1.030)
pH: 6 (ref 5.0–8.0)

## 2016-11-05 LAB — CBC
HCT: 35.8 % — ABNORMAL LOW (ref 39.0–52.0)
HEMOGLOBIN: 12.1 g/dL — AB (ref 13.0–17.0)
MCH: 34.5 pg — AB (ref 26.0–34.0)
MCHC: 33.8 g/dL (ref 30.0–36.0)
MCV: 102 fL — ABNORMAL HIGH (ref 78.0–100.0)
PLATELETS: 255 10*3/uL (ref 150–400)
RBC: 3.51 MIL/uL — AB (ref 4.22–5.81)
RDW: 12.7 % (ref 11.5–15.5)
WBC: 7.9 10*3/uL (ref 4.0–10.5)

## 2016-11-05 LAB — COMPREHENSIVE METABOLIC PANEL
ALT: 33 U/L (ref 17–63)
AST: 68 U/L — AB (ref 15–41)
Albumin: 4.5 g/dL (ref 3.5–5.0)
Alkaline Phosphatase: 75 U/L (ref 38–126)
Anion gap: 11 (ref 5–15)
BUN: 18 mg/dL (ref 6–20)
CHLORIDE: 103 mmol/L (ref 101–111)
CO2: 25 mmol/L (ref 22–32)
CREATININE: 2.18 mg/dL — AB (ref 0.61–1.24)
Calcium: 9.3 mg/dL (ref 8.9–10.3)
GFR, EST AFRICAN AMERICAN: 40 mL/min — AB (ref >60)
GFR, EST NON AFRICAN AMERICAN: 35 mL/min — AB (ref >60)
Glucose, Bld: 90 mg/dL (ref 65–99)
POTASSIUM: 4.1 mmol/L (ref 3.5–5.1)
SODIUM: 139 mmol/L (ref 135–145)
Total Bilirubin: 0.9 mg/dL (ref 0.3–1.2)
Total Protein: 7.6 g/dL (ref 6.5–8.1)

## 2016-11-05 LAB — OSMOLALITY, URINE: Osmolality, Ur: 746 mOsm/kg (ref 300–900)

## 2016-11-05 LAB — I-STAT CG4 LACTIC ACID, ED: Lactic Acid, Venous: 0.53 mmol/L (ref 0.5–1.9)

## 2016-11-05 LAB — SODIUM, URINE, RANDOM: Sodium, Ur: 122 mmol/L

## 2016-11-05 LAB — RAPID URINE DRUG SCREEN, HOSP PERFORMED
AMPHETAMINES: NOT DETECTED
BENZODIAZEPINES: NOT DETECTED
Barbiturates: NOT DETECTED
Cocaine: POSITIVE — AB
OPIATES: NOT DETECTED
TETRAHYDROCANNABINOL: POSITIVE — AB

## 2016-11-05 LAB — SALICYLATE LEVEL

## 2016-11-05 LAB — ETHANOL

## 2016-11-05 LAB — CREATININE, URINE, RANDOM: Creatinine, Urine: 281.91 mg/dL

## 2016-11-05 LAB — OSMOLALITY: OSMOLALITY: 291 mosm/kg (ref 275–295)

## 2016-11-05 LAB — ACETAMINOPHEN LEVEL: Acetaminophen (Tylenol), Serum: <10 ug/mL — ABNORMAL LOW (ref 10–30)

## 2016-11-05 MED ORDER — HYDROXYZINE HCL 25 MG PO TABS
25.0000 mg | ORAL_TABLET | Freq: Four times a day (QID) | ORAL | Status: DC | PRN
  Administered 2016-11-05 – 2016-11-06 (×2): 25 mg via ORAL
  Filled 2016-11-05 (×2): qty 1

## 2016-11-05 MED ORDER — ENOXAPARIN SODIUM 40 MG/0.4ML ~~LOC~~ SOLN
40.0000 mg | SUBCUTANEOUS | Status: DC
Start: 2016-11-05 — End: 2016-11-07
  Administered 2016-11-05: 40 mg via SUBCUTANEOUS
  Filled 2016-11-05 (×3): qty 0.4

## 2016-11-05 MED ORDER — SODIUM CHLORIDE 0.9 % IV BOLUS (SEPSIS)
1000.0000 mL | Freq: Once | INTRAVENOUS | Status: AC
  Administered 2016-11-05: 1000 mL via INTRAVENOUS

## 2016-11-05 MED ORDER — INFLUENZA VAC SPLIT QUAD 0.5 ML IM SUSY
0.5000 mL | PREFILLED_SYRINGE | INTRAMUSCULAR | Status: DC
  Filled 2016-11-05 (×2): qty 0.5

## 2016-11-05 MED ORDER — ONDANSETRON HCL 4 MG PO TABS: 4.0000 mg | ORAL_TABLET | Freq: Four times a day (QID) | ORAL | Status: DC | PRN

## 2016-11-05 MED ORDER — ONDANSETRON HCL 4 MG/2ML IJ SOLN: 4.0000 mg | Freq: Four times a day (QID) | INTRAMUSCULAR | Status: DC | PRN

## 2016-11-05 MED ORDER — OXYCODONE-ACETAMINOPHEN 5-325 MG PO TABS
1.0000 | ORAL_TABLET | Freq: Once | ORAL | Status: AC
  Administered 2016-11-05: 1 via ORAL
  Filled 2016-11-05: qty 1

## 2016-11-05 MED ORDER — GABAPENTIN 300 MG PO CAPS
300.0000 mg | ORAL_CAPSULE | Freq: Three times a day (TID) | ORAL | Status: DC
  Administered 2016-11-05 – 2016-11-07 (×7): 300 mg via ORAL
  Filled 2016-11-05 (×7): qty 1

## 2016-11-05 MED ORDER — ACETAMINOPHEN 650 MG RE SUPP: 650.0000 mg | Freq: Four times a day (QID) | RECTAL | Status: DC | PRN

## 2016-11-05 MED ORDER — SODIUM CHLORIDE 0.9 % IV SOLN
INTRAVENOUS | Status: DC
  Administered 2016-11-05: 75 mL/h via INTRAVENOUS
  Administered 2016-11-05 – 2016-11-06 (×2): via INTRAVENOUS

## 2016-11-05 MED ORDER — SODIUM CHLORIDE 0.9% FLUSH
3.0000 mL | Freq: Two times a day (BID) | INTRAVENOUS | Status: DC
  Administered 2016-11-05 – 2016-11-07 (×2): 3 mL via INTRAVENOUS

## 2016-11-05 MED ORDER — ACETAMINOPHEN 325 MG PO TABS: 650.0000 mg | ORAL_TABLET | Freq: Four times a day (QID) | ORAL | Status: DC | PRN

## 2016-11-05 MED ORDER — LEVOTHYROXINE SODIUM 100 MCG PO TABS
100.0000 ug | ORAL_TABLET | Freq: Every day | ORAL | Status: DC
  Administered 2016-11-05 – 2016-11-07 (×3): 100 ug via ORAL
  Filled 2016-11-05 (×3): qty 1

## 2016-11-05 NOTE — ED Provider Notes (Signed)
[facility] DEPT Provider Note   CSN: [id number] Arrival date & time: 11/05/16  0022     History   Chief Complaint Chief Complaint  Patient presents with  . Suicidal    HPI Johnathan Baker is a 45 y.o. male with a hx of Chronic back pain, depression, hypertension, thyroid disease presents to the Emergency Department complaining of gradual, persistent, progressively worsening suicidal ideations onset as a child. Patient reports that he does not feel safe. He requests inpatient therapy. He denies specific plan to harm himself. Denies homicidal ideations, auditory or visual hallucinations. Record review shows that patient was evaluated on 10/29/2016 and hospitalized until yesterday.   Patient denies fever, chills, nausea, vomiting, diarrhea, weakness, dizziness, syncope, dysuria, hematuria, chest pain, shortness of breath.  Patient reports continued cocaine usage. He denies alcohol usage. He states he's also been taking other kinds of pills including Seroquel and trazodone in an effort to get high.  Patient reports he often feels thirsty and drinks lots of water.  The history is provided by the patient and medical records. No language interpreter was used.    Past Medical History:  Diagnosis Date  . Back pain   . Depression   . Hypertension   . Thyroid disease     Patient Active Problem List   Diagnosis Date Noted  . AKI (acute kidney injury) ([place]) 11/05/2016  . Major depressive disorder, recurrent severe without psychotic features ([place]) 09/23/2016  . MDD (major depressive disorder), recurrent severe, without psychosis ([place]) 09/23/2016  . Cocaine abuse 12/21/2015  . Major depressive disorder, recurrent episode ([place]) 11/08/2015  . Alcohol dependence with uncomplicated withdrawal ([place])   . Thyroid activity decreased   . Cocaine dependence with cocaine-induced mood disorder ([place]) 10/03/2015  . Alcohol dependence ([place]) 10/03/2015  . Hypothyroidism 10/03/2015    History reviewed.  No pertinent surgical history.     Home Medications    Prior to Admission medications   Medication Sig Start Date End Date Taking? Authorizing Provider  diclofenac sodium (VOLTAREN) 1 % GEL Apply 2 g topically 4 (four) times daily as needed (right knee pain). 09/30/16  Yes Encarnacion Slates, NP  gabapentin (NEURONTIN) 300 MG capsule Take 1 capsule (300 mg total) by mouth 3 (three) times daily. For agitation 09/30/16  Yes Encarnacion Slates, NP  hydrOXYzine (ATARAX/VISTARIL) 25 MG tablet Take 1 tablet (25 mg total) by mouth every 6 (six) hours as needed for anxiety. 09/30/16  Yes Encarnacion Slates, NP  levothyroxine (SYNTHROID, LEVOTHROID) 100 MCG tablet Take 1 tablet (100 mcg total) by mouth daily before breakfast. For low functioning Thyroid gland 09/30/16  Yes Encarnacion Slates, NP  QUEtiapine (SEROQUEL) 25 MG tablet Take 3 tablets (75 mg total) by mouth at bedtime. For mood control 09/30/16  Yes Encarnacion Slates, NP  traZODone (DESYREL) 100 MG tablet Take 1 tablet (100 mg total) by mouth at bedtime as needed for sleep. 09/30/16  Yes Encarnacion Slates, NP  Menthol-Methyl Salicylate (MUSCLE RUB) 10-15 % CREA Apply 1 application topically 4 (four) times daily as needed for muscle pain (joint pain). Patient not taking: Reported on 11/05/2016 09/30/16   Encarnacion Slates, NP  nicotine polacrilex (NICORETTE) 2 MG gum Take 1 each (2 mg total) by mouth as needed for smoking cessation. Patient not taking: Reported on 11/05/2016 09/30/16   Encarnacion Slates, NP    Family History No family history on file.  Social History Social History  Substance Use Topics  . Smoking status:  Current Every Day Smoker    Packs/day: 0.50    Types: Cigarettes  . Smokeless tobacco: Never Used  . Alcohol use Yes     Comment: binge drinks     Allergies   Patient has no known allergies.   Review of Systems Review of Systems  Psychiatric/Behavioral: Positive for suicidal ideas.  All other systems reviewed and are  negative.    Physical Exam Updated Vital Signs BP (!) 82/47 (BP Location: Left Arm)   Pulse 70   Temp 97.6 F (36.4 C) (Oral)   Resp 18   SpO2 97%   Physical Exam  Constitutional: He appears well-developed and well-nourished. No distress.  Awake, alert, nontoxic appearance  HENT:  Head: Normocephalic and atraumatic.  Mouth/Throat: Oropharynx is clear and moist. No oropharyngeal exudate.  Eyes: Conjunctivae are normal. No scleral icterus.  Neck: Normal range of motion. Neck supple.  Cardiovascular: Normal rate, regular rhythm and intact distal pulses.   Pulmonary/Chest: Effort normal and breath sounds normal. No respiratory distress. He has no wheezes.  Equal chest expansion  Abdominal: Soft. Bowel sounds are normal. He exhibits no mass. There is no tenderness. There is no rebound and no guarding.  Musculoskeletal: Normal range of motion. He exhibits no edema.  Neurological: He is alert.  Speech is clear and goal oriented Moves extremities without ataxia  Skin: Skin is warm and dry. He is not diaphoretic.  Psychiatric: He has a normal mood and affect.  Nursing note and vitals reviewed.    ED Treatments / Results  Labs (all labs ordered are listed, but only abnormal results are displayed) Labs Reviewed  COMPREHENSIVE METABOLIC PANEL - Abnormal; Notable for the following:       Result Value   Creatinine, Ser 2.18 (*)    AST 68 (*)    GFR calc non Af Amer 35 (*)    GFR calc Af Amer 40 (*)    All other components within normal limits  ACETAMINOPHEN LEVEL - Abnormal; Notable for the following:    Acetaminophen (Tylenol), Serum <10 (*)    All other components within normal limits  CBC - Abnormal; Notable for the following:    RBC 3.51 (*)    Hemoglobin 12.1 (*)    HCT 35.8 (*)    MCV 102.0 (*)    MCH 34.5 (*)    All other components within normal limits  ETHANOL  SALICYLATE LEVEL  RAPID URINE DRUG SCREEN, HOSP PERFORMED  URINALYSIS, ROUTINE W REFLEX MICROSCOPIC   I-STAT CG4 LACTIC ACID, ED    Radiology Dg Chest 2 View  Result Date: 11/05/2016 CLINICAL DATA:  Hypotension and acute kidney injury. EXAM: CHEST  2 VIEW COMPARISON:  03/08/2013 FINDINGS: The cardiomediastinal contours are normal. Probable apical emphysema. Pulmonary vasculature is normal. No consolidation, pleural effusion, or pneumothorax. No acute osseous abnormalities are seen. IMPRESSION: No acute pulmonary process. Electronically Signed   By: Jeb Levering M.D.   On: 11/05/2016 04:47    Procedures Procedures (including critical care time)  Medications Ordered in ED Medications  sodium chloride 0.9 % bolus 1,000 mL (1,000 mLs Intravenous New Bag/Given 11/05/16 0458)     Initial Impression / Assessment and Plan / ED Course  I have reviewed the triage vital signs and the nursing notes.  Pertinent labs & imaging results that were available during my care of the patient were reviewed by me and considered in my medical decision making (see chart for details).  Clinical Course as of Nov 05 599  Sat Nov 05, 2016  0444 Patient is well appearing on my exam however a triage patient's blood pressure documented at 78/50.    [HM]  0444 Worsening creatinine up from 1.3 approximately 7 days ago.  Initial bump last week was thought to be dehydration but at this time and do not believe this adequately explains patient's lab work. He will need admission for further evaluation. Creatinine: (!) 2.18 [HM]    Clinical Course User Index [HM] Abigail Butts, PA-C    Patient presents with suicidal ideation. He has no clear plan. Patient initially hypotensive upon arrival.  Lab work shows AKI worsening since he was evaluated on 10/29/2016.  Patient with a history of polysubstance abuse. Reports recent cocaine along with other medications. Patient will need admission for further evaluation prior to psychiatric evaluation.  Final Clinical Impressions(s) / ED Diagnoses   Final diagnoses:   Suicidal ideation  AKI (acute kidney injury) ([place])  Depression, unspecified depression type  Polysubstance abuse    New Prescriptions New Prescriptions   No medications on file     Abigail Butts, PA-C 11/05/16 0600    Everlene Balls, MD 11/05/16 [phone]

## 2016-11-05 NOTE — H&P (Signed)
[facility] History and Physical  Purav Flener [id number] DOB: 05/03/1971 DOA: 11/05/2016  PCP: Carmie Kanner, NP  Patient coming from: home  Chief Complaint: Suicidal thoughts  HPI: Johnathan Baker is a 45 y.o. male with a medical history of depression, hypothyroidism, presented to the emergency department with complaints of suicidal ideations. Patient states this is been ongoing for several years however has continued to worsen with several months. He states he does not feel safe and does not have a home to go to. Patient denies any specific plan to her home himself. However he did state that yesterday he took several pills of Seroquel, trazodone and gabapentin. Patient states he sometimes hears voices and sees things however currently not seeing anything. Patient currently denies any chest pain, shortness of breath, abdominal pain, nausea or vomiting, diarrhea constipation, headache, dizziness, recent travel. He denies any alcohol use although does state that he has been using cocaine, last used 2 days ago. Patient also endorses smoking.  ED Course: Found to have hypotension and acute kidney injury. [facility] called for admission.  Review of Systems:  All other systems reviewed and are negative.   Past Medical History:  Diagnosis Date  . Back pain   . Depression   . Hypertension   . Thyroid disease     History reviewed. No pertinent surgical history.  Social History:  reports that he has been smoking Cigarettes.  He has been smoking about 0.50 packs per day. He has never used smokeless tobacco. He reports that he drinks alcohol. He reports that he uses drugs, including Cocaine and Marijuana.  No Known Allergies  No family history on file. Denies family history of hypertension, heart disease, diabetes.  Prior to Admission medications   Medication Sig Start Date End Date Taking? Authorizing Provider  diclofenac sodium (VOLTAREN) 1 % GEL Apply 2 g topically 4 (four) times daily as  needed (right knee pain). 09/30/16  Yes Encarnacion Slates, NP  gabapentin (NEURONTIN) 300 MG capsule Take 1 capsule (300 mg total) by mouth 3 (three) times daily. For agitation 09/30/16  Yes Encarnacion Slates, NP  hydrOXYzine (ATARAX/VISTARIL) 25 MG tablet Take 1 tablet (25 mg total) by mouth every 6 (six) hours as needed for anxiety. 09/30/16  Yes Encarnacion Slates, NP  levothyroxine (SYNTHROID, LEVOTHROID) 100 MCG tablet Take 1 tablet (100 mcg total) by mouth daily before breakfast. For low functioning Thyroid gland 09/30/16  Yes Encarnacion Slates, NP  QUEtiapine (SEROQUEL) 25 MG tablet Take 3 tablets (75 mg total) by mouth at bedtime. For mood control 09/30/16  Yes Encarnacion Slates, NP  traZODone (DESYREL) 100 MG tablet Take 1 tablet (100 mg total) by mouth at bedtime as needed for sleep. 09/30/16  Yes Encarnacion Slates, NP  Menthol-Methyl Salicylate (MUSCLE RUB) 10-15 % CREA Apply 1 application topically 4 (four) times daily as needed for muscle pain (joint pain). Patient not taking: Reported on 11/05/2016 09/30/16   Encarnacion Slates, NP  nicotine polacrilex (NICORETTE) 2 MG gum Take 1 each (2 mg total) by mouth as needed for smoking cessation. Patient not taking: Reported on 11/05/2016 09/30/16   Encarnacion Slates, NP    Physical Exam: Vitals:   11/05/16 0612 11/05/16 0645  BP: 112/76 (!) 141/80  Pulse: 66 66  Resp: 18 18  Temp:  97.4 F (36.3 C)     General: Well developed, well nourished, NAD, appears stated age  HEENT: NCAT, PERRLA, EOMI, Anicteic Sclera, mucous membranes slightly dry.  Neck: Supple, no JVD, no masses  Cardiovascular: S1 S2 auscultated, no rubs, murmurs or gallops. Regular rate and rhythm.  Respiratory: Clear to auscultation bilaterally with equal chest rise  Abdomen: Soft, nontender, nondistended, + bowel sounds  Extremities: warm dry without cyanosis clubbing or edema  Neuro: AAOx3, cranial nerves grossly intact. Strength 5/5 in patient's upper and lower extremities  bilaterally  Skin: Without rashes exudates or nodules  Psych: Appropriate mood and affect  Labs on Admission: I have personally reviewed following labs and imaging studies CBC:  Recent Labs Lab 10/29/16 0857 11/05/16 0042  WBC 4.6 7.9  HGB 13.4 12.1*  HCT 40.9 35.8*  MCV 103.3* 102.0*  PLT [id number]   Basic Metabolic Panel:  Recent Labs Lab 10/29/16 0857 11/05/16 0042  NA 138 139  K 4.4 4.1  CL 105 103  CO2 25 25  GLUCOSE 79 90  BUN 16 18  CREATININE 1.33* 2.18*  CALCIUM 9.2 9.3   GFR: Estimated Creatinine Clearance: 58.3 mL/min (by C-G formula based on SCr of 2.18 mg/dL (H)). Liver Function Tests:  Recent Labs Lab 10/29/16 0857 11/05/16 0042  AST 33 68*  ALT 24 33  ALKPHOS 73 75  BILITOT 0.5 0.9  PROT 7.5 7.6  ALBUMIN 4.3 4.5   No results for input(s): LIPASE, AMYLASE in the last 168 hours. No results for input(s): AMMONIA in the last 168 hours. Coagulation Profile: No results for input(s): INR, PROTIME in the last 168 hours. Cardiac Enzymes: No results for input(s): CKTOTAL, CKMB, CKMBINDEX, TROPONINI in the last 168 hours. BNP (last 3 results) No results for input(s): PROBNP in the last 8760 hours. HbA1C: No results for input(s): HGBA1C in the last 72 hours. CBG: No results for input(s): GLUCAP in the last 168 hours. Lipid Profile: No results for input(s): CHOL, HDL, LDLCALC, TRIG, CHOLHDL, LDLDIRECT in the last 72 hours. Thyroid Function Tests: No results for input(s): TSH, T4TOTAL, FREET4, T3FREE, THYROIDAB in the last 72 hours. Anemia Panel: No results for input(s): VITAMINB12, FOLATE, FERRITIN, TIBC, IRON, RETICCTPCT in the last 72 hours. Urine analysis:    Component Value Date/Time   COLORURINE YELLOW 11/05/2016 0645   APPEARANCEUR HAZY (A) 11/05/2016 0645   LABSPEC 1.016 11/05/2016 0645   PHURINE 6.0 11/05/2016 0645   GLUCOSEU NEGATIVE 11/05/2016 0645   HGBUR NEGATIVE 11/05/2016 0645   BILIRUBINUR NEGATIVE 11/05/2016 0645    BILIRUBINUR neg 02/11/2014 1420   KETONESUR NEGATIVE 11/05/2016 0645   PROTEINUR 30 (A) 11/05/2016 0645   UROBILINOGEN 0.2 02/11/2014 1420   UROBILINOGEN 1.0 08/15/2011 0350   NITRITE NEGATIVE 11/05/2016 0645   LEUKOCYTESUR NEGATIVE 11/05/2016 0645   Sepsis Labs: @LABRCNTIP (procalcitonin:4,lacticidven:4) )No results found for this or any previous visit (from the past 240 hour(s)).   Radiological Exams on Admission: Dg Chest 2 View  Result Date: 11/05/2016 CLINICAL DATA:  Hypotension and acute kidney injury. EXAM: CHEST  2 VIEW COMPARISON:  03/08/2013 FINDINGS: The cardiomediastinal contours are normal. Probable apical emphysema. Pulmonary vasculature is normal. No consolidation, pleural effusion, or pneumothorax. No acute osseous abnormalities are seen. IMPRESSION: No acute pulmonary process. Electronically Signed   By: Jeb Levering M.D.   On: 11/05/2016 04:47    EKG: None  Assessment/Plan  Acute kidney injury -Baseline creatinine 1.3, upon admission creatinine 2.18 -Suspect due to poor oral intake -Gentle IV fluid rehydration -Obtain urine electrolytes, check FeNa -Monitor intake and output  Suicidal ideations/overdose -Patient is had multiple inpatient peripheral psych admissions -Psychiatry consulted and appreciated -Supposedly, patient took several Seroquel  tablets and trazodone along with gabapentin, 2 each. -Air cabin crew -Telemetry monitoring -will obtain EKG  Hypothyroidism -Continue Synthroid  Essential hypertension -Currently not on any home medications. -Patient was hypotensive upon admission however responded well to IV fluids  Depression -Hold Seroquel and trazodone  Polysubstance abuse -Tox screen positive for cocaine and THC -Patient also endorses tobacco abuse -Discussed cessation -Refused nicotine patch  DVT prophylaxis: Lovenox  Code Status: Full  Family Communication: None at bedside. Admission, patients condition and plan of care  including tests being ordered have been discussed with the patient, who indicates understanding and agrees with the plan and Code Status.  Disposition Plan: Home when stable  Consults called: Psychiatry   Admission status: Observation   Time spent: 70 minutes  Seddrick Flax D.O. [facility] Pager [phone]  If 7PM-7AM, please contact night-coverage [email or web address] Password [facility] 11/05/2016, 8:43 AM

## 2016-11-05 NOTE — Consult Note (Signed)
[facility] Consult   Reason for Consult:  Suicidal ideation, substance abuse possible overdose Referring Physician:  Dr. Barbaraann Rondo Patient Identification: Johnathan Baker MRN:  [id number] Principal Diagnosis: <principal problem not specified> Diagnosis:   Patient Active Problem List   Diagnosis Date Noted  . AKI (acute kidney injury) ([place]) [N17.9] 11/05/2016  . Major depressive disorder, recurrent severe without psychotic features ([place]) [F33.2] 09/23/2016  . MDD (major depressive disorder), recurrent severe, without psychosis ([place]) [F33.2] 09/23/2016  . Cocaine abuse [F14.10] 12/21/2015  . Major depressive disorder, recurrent episode ([place]) [F33.9] 11/08/2015  . Alcohol dependence with uncomplicated withdrawal ([place]) [F10.230]   . Thyroid activity decreased [E03.9]   . Cocaine dependence with cocaine-induced mood disorder ([place]) [F14.24] 10/03/2015  . Alcohol dependence ([place]) [F10.20] 10/03/2015  . Hypothyroidism [E03.9] 10/03/2015    Total Time spent with patient: 30 minutes  Subjective:   Johnathan Baker is a 45 y.o. male patient admitted with suicidal ideation, AKI  HPI:    Past Psychiatric History: Pt is a 45 y.o. male with a medical history of depression, hypothyroidism, presented to the emergency department with complaints of suicidal ideations. Patient states this is been ongoing for several years however has continued to worsen with several months. He states he does not feel safe and does not have a home to go to. Patient denies any specific plan to her home himself. However he did state that yesterday he took several pills of Seroquel, trazodone and gabapentin. Patient states he sometimes hears voices and sees things however currently not seeing anything. Patient currently denies any chest pain, shortness of breath, abdominal pain, nausea or vomiting, diarrhea constipation, headache, dizziness, recent travel. He denies any alcohol use although does state that he has been using  cocaine, last used 2 days ago. Patient also endorses smoking.  ED Course: Found to have hypotension and acute kidney injury. [facility] called for admission.  Today patient states that he is homeless. He had been living with his wife but they couldn't get along. He has a long-term history of polysubstance abuse from early cocaine and marijuana. He states that he just got out of the [facility] facility a few days ago and went right back to using cocaine. He states that he has been "trying hard" to get his life in order to go to his appointments at the [facility] etc. he states that all the drug dealers know him and will find him and give him drugs. He wants his life to change but denies being suicidal at the present time. At times he feels a bit paranoid but denies auditory or visual hallucinations. He states that he took 3 trazodone and 3 Seroquel tablets a few days ago in order to go to sleep but denies that this was a suicide attempt. He was supposed to been discharged from the ED with outpatient resources but his kidney function indicated elevated creatinine and he has being monitored for this  Risk to Self: Is patient at risk for suicide?: Yes Risk to Others:   Prior Inpatient Therapy:   Prior Outpatient Therapy:    Past Medical History:  Past Medical History:  Diagnosis Date  . Back pain   . Depression   . Hypertension   . Thyroid disease    History reviewed. No pertinent surgical history. Family History: No family history on file. Family Psychiatric  History: None Social History:  History  Alcohol Use  . Yes    Comment: binge drinks     History  Drug  Use  . Types: Cocaine, Marijuana    Social History   Social History  . Marital status: Married    Spouse name: N/A  . Number of children: N/A  . Years of education: N/A   Social History Main Topics  . Smoking status: Current Every Day Smoker    Packs/day: 0.50    Types: Cigarettes  . Smokeless tobacco: Never Used  . Alcohol use Yes      Comment: binge drinks  . Drug use:     Types: Cocaine, Marijuana  . Sexual activity: Yes   Other Topics Concern  . None   Social History Narrative  . None   Additional Social History:Patient is homeless. He states that he cannot work due to chronic back pain    Allergies:  No Known Allergies  Labs:  Results for orders placed or performed during the hospital encounter of 11/05/16 (from the past 48 hour(s))  Comprehensive metabolic panel     Status: Abnormal   Collection Time: 11/05/16 12:42 AM  Result Value Ref Range   Sodium 139 135 - 145 mmol/L   Potassium 4.1 3.5 - 5.1 mmol/L   Chloride 103 101 - 111 mmol/L   CO2 25 22 - 32 mmol/L   Glucose, Bld 90 65 - 99 mg/dL   BUN 18 6 - 20 mg/dL   Creatinine, Ser 2.18 (H) 0.61 - 1.24 mg/dL   Calcium 9.3 8.9 - 10.3 mg/dL   Total Protein 7.6 6.5 - 8.1 g/dL   Albumin 4.5 3.5 - 5.0 g/dL   AST 68 (H) 15 - 41 U/L   ALT 33 17 - 63 U/L   Alkaline Phosphatase 75 38 - 126 U/L   Total Bilirubin 0.9 0.3 - 1.2 mg/dL   GFR calc non Af Amer 35 (L) >60 mL/min   GFR calc Af Amer 40 (L) >60 mL/min    Comment: (NOTE) The eGFR has been calculated using the CKD EPI equation. This calculation has not been validated in all clinical situations. eGFR's persistently <60 mL/min signify possible Chronic Kidney Disease.    Anion gap 11 5 - 15  cbc     Status: Abnormal   Collection Time: 11/05/16 12:42 AM  Result Value Ref Range   WBC 7.9 4.0 - 10.5 K/uL   RBC 3.51 (L) 4.22 - 5.81 MIL/uL   Hemoglobin 12.1 (L) 13.0 - 17.0 g/dL   HCT 35.8 (L) 39.0 - 52.0 %   MCV 102.0 (H) 78.0 - 100.0 fL   MCH 34.5 (H) 26.0 - 34.0 pg   MCHC 33.8 30.0 - 36.0 g/dL   RDW 12.7 11.5 - 15.5 %   Platelets 255 150 - 400 K/uL  Ethanol     Status: None   Collection Time: 11/05/16 12:43 AM  Result Value Ref Range   Alcohol, Ethyl (B) <5 <5 mg/dL    Comment:        LOWEST DETECTABLE LIMIT FOR SERUM ALCOHOL IS 5 mg/dL FOR MEDICAL PURPOSES ONLY   Salicylate level      Status: None   Collection Time: 11/05/16 12:43 AM  Result Value Ref Range   Salicylate Lvl <9.4 2.8 - 30.0 mg/dL  Acetaminophen level     Status: Abnormal   Collection Time: 11/05/16 12:43 AM  Result Value Ref Range   Acetaminophen (Tylenol), Serum <10 (L) 10 - 30 ug/mL    Comment:        THERAPEUTIC CONCENTRATIONS VARY SIGNIFICANTLY. A RANGE OF 10-30 ug/mL MAY BE AN  EFFECTIVE CONCENTRATION FOR MANY PATIENTS. HOWEVER, SOME ARE BEST TREATED AT CONCENTRATIONS OUTSIDE THIS RANGE. ACETAMINOPHEN CONCENTRATIONS >150 ug/mL AT 4 HOURS AFTER INGESTION AND >50 ug/mL AT 12 HOURS AFTER INGESTION ARE OFTEN ASSOCIATED WITH TOXIC REACTIONS.   Osmolality     Status: None   Collection Time: 11/05/16 12:43 AM  Result Value Ref Range   Osmolality 291 275 - 295 mOsm/kg    Comment: Performed at [facility] Lactic Acid, ED     Status: None   Collection Time: 11/05/16  5:03 AM  Result Value Ref Range   Lactic Acid, Venous 0.53 0.5 - 1.9 mmol/L  Rapid urine drug screen (hospital performed)     Status: Abnormal   Collection Time: 11/05/16  6:45 AM  Result Value Ref Range   Opiates NONE DETECTED NONE DETECTED   Cocaine POSITIVE (A) NONE DETECTED   Benzodiazepines NONE DETECTED NONE DETECTED   Amphetamines NONE DETECTED NONE DETECTED   Tetrahydrocannabinol POSITIVE (A) NONE DETECTED   Barbiturates NONE DETECTED NONE DETECTED    Comment:        DRUG SCREEN FOR MEDICAL PURPOSES ONLY.  IF CONFIRMATION IS NEEDED FOR ANY PURPOSE, NOTIFY LAB WITHIN 5 DAYS.        LOWEST DETECTABLE LIMITS FOR URINE DRUG SCREEN Drug Class       Cutoff (ng/mL) Amphetamine      1000 Barbiturate      200 Benzodiazepine   631 Tricyclics       497 Opiates          300 Cocaine          300 THC              50   Urinalysis, Routine w reflex microscopic     Status: Abnormal   Collection Time: 11/05/16  6:45 AM  Result Value Ref Range   Color, Urine YELLOW YELLOW   APPearance HAZY (A) CLEAR    Specific Gravity, Urine 1.016 1.005 - 1.030   pH 6.0 5.0 - 8.0   Glucose, UA NEGATIVE NEGATIVE mg/dL   Hgb urine dipstick NEGATIVE NEGATIVE   Bilirubin Urine NEGATIVE NEGATIVE   Ketones, ur NEGATIVE NEGATIVE mg/dL   Protein, ur 30 (A) NEGATIVE mg/dL   Nitrite NEGATIVE NEGATIVE   Leukocytes, UA NEGATIVE NEGATIVE   RBC / HPF 0-5 0 - 5 RBC/hpf   WBC, UA 0-5 0 - 5 WBC/hpf   Bacteria, UA RARE (A) NONE SEEN   Squamous Epithelial / LPF 0-5 (A) NONE SEEN   Mucous PRESENT    Hyaline Casts, UA PRESENT    Sperm, UA PRESENT     Current Facility-Administered Medications  Medication Dose Route Frequency Provider Last Rate Last Dose  . 0.9 %  sodium chloride infusion   Intravenous Continuous Maryann Mikhail, DO 75 mL/hr at 11/05/16 0815 75 mL/hr at 11/05/16 0815  . acetaminophen (TYLENOL) tablet 650 mg  650 mg Oral Q6H PRN Maryann Mikhail, DO       Or  . acetaminophen (TYLENOL) suppository 650 mg  650 mg Rectal Q6H PRN Maryann Mikhail, DO      . enoxaparin (LOVENOX) injection 40 mg  40 mg Subcutaneous Q24H Maryann Mikhail, DO   40 mg at 11/05/16 1019  . gabapentin (NEURONTIN) capsule 300 mg  300 mg Oral TID Maryann Mikhail, DO   300 mg at 11/05/16 1020  . hydrOXYzine (ATARAX/VISTARIL) tablet 25 mg  25 mg Oral Q6H PRN Maryann Mikhail, DO      .  levothyroxine (SYNTHROID, LEVOTHROID) tablet 100 mcg  100 mcg Oral QAC breakfast Maryann Mikhail, DO   100 mcg at 11/05/16 1020  . ondansetron (ZOFRAN) tablet 4 mg  4 mg Oral Q6H PRN Maryann Mikhail, DO       Or  . ondansetron (ZOFRAN) injection 4 mg  4 mg Intravenous Q6H PRN Maryann Mikhail, DO      . sodium chloride flush (NS) 0.9 % injection 3 mL  3 mL Intravenous Q12H Maryann Mikhail, DO   3 mL at 11/05/16 1000    Musculoskeletal: Strength & Muscle Tone: within normal limits Gait & Station: normal Patient leans: N/A  Psychiatric Specialty Exam: Physical Exam  ROS  Blood pressure (!) 141/80, pulse 66, temperature 97.4 F (36.3 C), temperature  source Oral, resp. rate 18, height 6' 5"  (1.956 m), weight 107.2 kg (236 lb 6.4 oz), SpO2 100 %.Body mass index is 28.03 kg/m.  General Appearance: Casual and Fairly Groomed  Eye Contact:  Good  Speech:  Clear and Coherent  Volume:  Normal  Mood:  Anxious  Affect:  Appropriate  Thought Process:  Goal Directed  Orientation:  Full (Time, Place, and Person)  Thought Content:  Rumination  Suicidal Thoughts:  No  Homicidal Thoughts:  No  Memory:  Immediate;   Good Recent;   Fair Remote;   Fair  Judgement:  Poor  Insight:  Lacking  Psychomotor Activity:  Normal  Concentration:  Concentration: Good and Attention Span: Good  Recall:  Good  Fund of Knowledge:  Good  Language:  Good  Akathisia:  No  Handed:  Right  AIMS (if indicated):     Assets:  Communication Skills Desire for Improvement Resilience  ADL's:  Intact  Cognition:  WNL  Sleep:        Treatment Plan Summary: Agree with holding Seroquel and trazodone while elevated creatinine is being treated. Patient does not meet criteria for inpatient hospitalization but is interested in longer term substance abuse treatment. Substance abuse induced mood disorder and substance abuse chronic are his main diagnoses. Suggest a social work consult to help him find resources for treatment  Disposition: No evidence of imminent risk to self or others at present.   Supportive therapy provided about ongoing stressors.  Levonne Spiller, MD 11/05/2016 2:26 PM

## 2016-11-05 NOTE — ED Triage Notes (Signed)
Pt bib [facility] from d/t SI/HI.  Pt has no clear plan to complete suicide.  Pt calm and cooperative for EMS.

## 2016-11-05 NOTE — ED Notes (Signed)
Bed: WTR7 Expected date:  Expected time:  Means of arrival:  Comments: 

## 2016-11-06 DIAGNOSIS — F17210 Nicotine dependence, cigarettes, uncomplicated: Secondary | ICD-10-CM | POA: Diagnosis present

## 2016-11-06 DIAGNOSIS — Y929 Unspecified place or not applicable: Secondary | ICD-10-CM | POA: Diagnosis not present

## 2016-11-06 DIAGNOSIS — N179 Acute kidney failure, unspecified: Secondary | ICD-10-CM | POA: Diagnosis present

## 2016-11-06 DIAGNOSIS — T43502S Poisoning by unspecified antipsychotics and neuroleptics, intentional self-harm, sequela: Secondary | ICD-10-CM

## 2016-11-06 DIAGNOSIS — R45851 Suicidal ideations: Secondary | ICD-10-CM | POA: Diagnosis present

## 2016-11-06 DIAGNOSIS — R9431 Abnormal electrocardiogram [ECG] [EKG]: Secondary | ICD-10-CM

## 2016-11-06 DIAGNOSIS — F1210 Cannabis abuse, uncomplicated: Secondary | ICD-10-CM | POA: Diagnosis present

## 2016-11-06 DIAGNOSIS — Z590 Homelessness: Secondary | ICD-10-CM | POA: Diagnosis not present

## 2016-11-06 DIAGNOSIS — F332 Major depressive disorder, recurrent severe without psychotic features: Secondary | ICD-10-CM | POA: Diagnosis present

## 2016-11-06 DIAGNOSIS — E039 Hypothyroidism, unspecified: Secondary | ICD-10-CM | POA: Diagnosis present

## 2016-11-06 DIAGNOSIS — T43592A Poisoning by other antipsychotics and neuroleptics, intentional self-harm, initial encounter: Secondary | ICD-10-CM | POA: Diagnosis present

## 2016-11-06 DIAGNOSIS — I129 Hypertensive chronic kidney disease with stage 1 through stage 4 chronic kidney disease, or unspecified chronic kidney disease: Secondary | ICD-10-CM | POA: Diagnosis present

## 2016-11-06 DIAGNOSIS — I959 Hypotension, unspecified: Secondary | ICD-10-CM | POA: Diagnosis present

## 2016-11-06 DIAGNOSIS — N182 Chronic kidney disease, stage 2 (mild): Secondary | ICD-10-CM | POA: Diagnosis not present

## 2016-11-06 DIAGNOSIS — T43212A Poisoning by selective serotonin and norepinephrine reuptake inhibitors, intentional self-harm, initial encounter: Secondary | ICD-10-CM | POA: Diagnosis present

## 2016-11-06 DIAGNOSIS — I4581 Long QT syndrome: Secondary | ICD-10-CM | POA: Diagnosis present

## 2016-11-06 DIAGNOSIS — D7589 Other specified diseases of blood and blood-forming organs: Secondary | ICD-10-CM | POA: Diagnosis present

## 2016-11-06 DIAGNOSIS — F1410 Cocaine abuse, uncomplicated: Secondary | ICD-10-CM | POA: Diagnosis present

## 2016-11-06 DIAGNOSIS — T426X2A Poisoning by other antiepileptic and sedative-hypnotic drugs, intentional self-harm, initial encounter: Secondary | ICD-10-CM | POA: Diagnosis present

## 2016-11-06 LAB — BASIC METABOLIC PANEL
ANION GAP: 6 (ref 5–15)
BUN: 20 mg/dL (ref 6–20)
CALCIUM: 8.7 mg/dL — AB (ref 8.9–10.3)
CHLORIDE: 109 mmol/L (ref 101–111)
CO2: 24 mmol/L (ref 22–32)
Creatinine, Ser: 1.38 mg/dL — ABNORMAL HIGH (ref 0.61–1.24)
GFR calc non Af Amer: 60 mL/min — ABNORMAL LOW (ref >60)
Glucose, Bld: 90 mg/dL (ref 65–99)
Potassium: 4.8 mmol/L (ref 3.5–5.1)
Sodium: 139 mmol/L (ref 135–145)

## 2016-11-06 LAB — CBC
HCT: 34.8 % — ABNORMAL LOW (ref 39.0–52.0)
HEMOGLOBIN: 11.5 g/dL — AB (ref 13.0–17.0)
MCH: 34.4 pg — AB (ref 26.0–34.0)
MCHC: 33 g/dL (ref 30.0–36.0)
MCV: 104.2 fL — AB (ref 78.0–100.0)
Platelets: 251 10*3/uL (ref 150–400)
RBC: 3.34 MIL/uL — AB (ref 4.22–5.81)
RDW: 12.8 % (ref 11.5–15.5)
WBC: 5.7 10*3/uL (ref 4.0–10.5)

## 2016-11-06 NOTE — Progress Notes (Signed)
PROGRESS NOTE  Johnathan Baker [id number] DOB: 10/02/1971 DOA: 11/05/2016 PCP: Carmie Kanner, NP  HPI/Recap of past 62 hours: 45 year old male past history of hypothyroidism and major depressive disorder  brought in on 12/30 after suicide attempt this patient took a number of different pills. At that time he was found to have acute kidney injury as well as prolonged QTC. Monitored overnight with IV fluids. Seen by psychiatry left discussion, felt patient does not risk for harm to self or others.  This morning, patient states he saw a little bit better. Creatinine improving. Repeat EKG this morning notes resolution of QTC  Assessment/Plan: Active Problems:   Hypothyroidism: Continue Synthroid    MDD (major depressive disorder), recurrent severe, without psychosis [facility]): Outpatient follow-up.  Given patient's homeless status, awaiting social work recommendations for places he can go   AKI (acute kidney injury) [facility]): Secondary to ingestion of multiple psychiatric medications reviewed. Improving with IV fluids, will continue Prolonged QTC: Second to ingestion of psychiatric medications. Resolved. Suicide attempt: Patient felt to no longer be danger to himself or to psychiatry  Macrocytosis: Appears chronic  Code Status: Full code   Family Communication: Declined for me to call family   Disposition Plan: Anticipate discharge tomorrow    Consultants:  Psychiatry   Procedures:  None   Antimicrobials:  None   DVT prophylaxis:  Lovenox   Objective: Vitals:   11/05/16 1514 11/05/16 2032 11/06/16 0619 11/06/16 1458  BP: 121/66 111/75 100/60 133/75  Pulse: 60 67 60 60  Resp: 18 18 18 17   Temp: 97.6 F (36.4 C) 98.3 F (36.8 C) 98.1 F (36.7 C) 98.8 F (37.1 C)  TempSrc: Oral Oral Oral Oral  SpO2: 100% 99% 100% 99%  Weight:      Height:        Intake/Output Summary (Last 24 hours) at 11/06/16 1615 Last data filed at 11/06/16 1500  Gross per 24 hour  Intake              3150 ml  Output                0 ml  Net             3150 ml   Filed Weights   11/05/16 0645  Weight: 107.2 kg (236 lb 6.4 oz)    Exam:   General:  Alert and oriented 3, no acute distress   Cardiovascular: Regular rate and rhythm, S1-S2   Respiratory: Clear to auscultation bilaterally   Abdomen: Soft, nontender, nondistended, positive bowel sounds   Musculoskeletal: No clubbing or cyanosis or edema  Skin: No skin breaks, tears or lesions  Psychiatry: Patient is currently appropriate. Slightly depressed affect   Data Reviewed: CBC:  Recent Labs Lab 11/05/16 0042 11/06/16 0511  WBC 7.9 5.7  HGB 12.1* 11.5*  HCT 35.8* 34.8*  MCV 102.0* 104.2*  PLT 255 [id number]   Basic Metabolic Panel:  Recent Labs Lab 11/05/16 0042 11/06/16 0511  NA 139 139  K 4.1 4.8  CL 103 109  CO2 25 24  GLUCOSE 90 90  BUN 18 20  CREATININE 2.18* 1.38*  CALCIUM 9.3 8.7*   GFR: Estimated Creatinine Clearance: 91.1 mL/min (by C-G formula based on SCr of 1.38 mg/dL (H)). Liver Function Tests:  Recent Labs Lab 11/05/16 0042  AST 68*  ALT 33  ALKPHOS 75  BILITOT 0.9  PROT 7.6  ALBUMIN 4.5   No results for input(s): LIPASE, AMYLASE in the last 168 hours. No  results for input(s): AMMONIA in the last 168 hours. Coagulation Profile: No results for input(s): INR, PROTIME in the last 168 hours. Cardiac Enzymes: No results for input(s): CKTOTAL, CKMB, CKMBINDEX, TROPONINI in the last 168 hours. BNP (last 3 results) No results for input(s): PROBNP in the last 8760 hours. HbA1C: No results for input(s): HGBA1C in the last 72 hours. CBG: No results for input(s): GLUCAP in the last 168 hours. Lipid Profile: No results for input(s): CHOL, HDL, LDLCALC, TRIG, CHOLHDL, LDLDIRECT in the last 72 hours. Thyroid Function Tests: No results for input(s): TSH, T4TOTAL, FREET4, T3FREE, THYROIDAB in the last 72 hours. Anemia Panel: No results for input(s): VITAMINB12, FOLATE, FERRITIN,  TIBC, IRON, RETICCTPCT in the last 72 hours. Urine analysis:    Component Value Date/Time   COLORURINE YELLOW 11/05/2016 0645   APPEARANCEUR HAZY (A) 11/05/2016 0645   LABSPEC 1.016 11/05/2016 0645   PHURINE 6.0 11/05/2016 0645   GLUCOSEU NEGATIVE 11/05/2016 0645   HGBUR NEGATIVE 11/05/2016 0645   BILIRUBINUR NEGATIVE 11/05/2016 0645   BILIRUBINUR neg 02/11/2014 1420   KETONESUR NEGATIVE 11/05/2016 0645   PROTEINUR 30 (A) 11/05/2016 0645   UROBILINOGEN 0.2 02/11/2014 1420   UROBILINOGEN 1.0 08/15/2011 0350   NITRITE NEGATIVE 11/05/2016 0645   LEUKOCYTESUR NEGATIVE 11/05/2016 0645   Sepsis Labs: @LABRCNTIP (procalcitonin:4,lacticidven:4)  )No results found for this or any previous visit (from the past 240 hour(s)).    Studies: No results found.  Scheduled Meds: . enoxaparin (LOVENOX) injection  40 mg Subcutaneous Q24H  . gabapentin  300 mg Oral TID  . Influenza vac split quadrivalent PF  0.5 mL Intramuscular Tomorrow-1000  . levothyroxine  100 mcg Oral QAC breakfast  . sodium chloride flush  3 mL Intravenous Q12H    Continuous Infusions: . sodium chloride 75 mL/hr at 11/05/16 2309     LOS: 0 days     Annita Brod, MD [facility] Pager [phone]  If 7PM-7AM, please contact night-coverage [email or web address] Password [email or web address] 11/06/2016, 4:15 PM

## 2016-11-07 DIAGNOSIS — N182 Chronic kidney disease, stage 2 (mild): Secondary | ICD-10-CM

## 2016-11-07 DIAGNOSIS — F332 Major depressive disorder, recurrent severe without psychotic features: Secondary | ICD-10-CM

## 2016-11-07 LAB — BASIC METABOLIC PANEL
ANION GAP: 6 (ref 5–15)
BUN: 16 mg/dL (ref 6–20)
CALCIUM: 8.6 mg/dL — AB (ref 8.9–10.3)
CO2: 24 mmol/L (ref 22–32)
Chloride: 108 mmol/L (ref 101–111)
Creatinine, Ser: 1.23 mg/dL (ref 0.61–1.24)
GFR calc Af Amer: >60 mL/min (ref >60)
GLUCOSE: 91 mg/dL (ref 65–99)
Potassium: 4.3 mmol/L (ref 3.5–5.1)
Sodium: 138 mmol/L (ref 135–145)

## 2016-11-07 NOTE — Discharge Summary (Signed)
Discharge Summary  Johnathan Baker [id number] DOB: 1970/11/28  PCP: Carmie Kanner, NP  Admit date: 11/05/2016 Discharge date: 11/07/2016  Time spent: 25 minutes   Recommendations for Outpatient Follow-up:  1. Patient getting referral for resources for substance abuse and mood disorder   Discharge Diagnoses:  Active Hospital Problems   Diagnosis Date Noted  . CKD (chronic kidney disease), symptom management only, stage 2 (mild) 11/06/2016  . AKI (acute kidney injury) ([place]) 11/05/2016  . MDD (major depressive disorder), recurrent severe, without psychosis ([place]) 09/23/2016  . Thyroid activity decreased   . Hypothyroidism 10/03/2015    Resolved Hospital Problems   Diagnosis Date Noted Date Resolved  No resolved problems to display.    Discharge Condition: Improved, being discharged to home   Diet recommendation: Regular   Vitals:   11/06/16 2240 11/07/16 0627  BP: (!) 154/89 136/85  Pulse: 62 (!) 56  Resp: 16 16  Temp: 99.2 F (37.3 C) 98.4 F (36.9 C)    History of present illness:  46 year old male past history of hypothyroidism and major depressive disorder  brought in on 12/30 after suicide attempt this patient took a number of different pills. At that time he was found to have acute kidney injury with a creatinine of 2.18 as well as prolonged QTC. Monitored overnight with IV fluids. Seen by psychiatry left discussion, felt patient does not risk for harm to self or others.   Hospital Course:  Active Problems:   Hypothyroidism: Stable continue on Synthroid   Thyroid activity decreased   MDD (major depressive disorder), recurrent severe, without psychosis [facility]): Outpatient follow-up. Patient states that he where he was living, he cannot return there. Social worker to give him places for shelters.  Macrocytosis: Appears chronic    Acute kidney injury in the setting of CKD (chronic kidney disease), symptom management only, stage 2 (mild) colon secondary to ingestion of  multiple psychiatric medications. By hospital day 2, creatinine down to 1.38 and by day of discharge, down to baseline with a creatinine of 1.23.  Prolonged QTC: Secondary to ingestion of psychiatric medications. EKG on 12/31 noted resolution. Procedures:  None   Consultations:  Psychiatry   Discharge Exam: BP 136/85 (BP Location: Right Arm)   Pulse (!) 56   Temp 98.4 F (36.9 C) (Oral)   Resp 16   Ht 6\' 5"  (1.956 m)   Wt 107.2 kg (236 lb 6.4 oz)   SpO2 100%   BMI 28.03 kg/m   General: Alert and oriented 3, no acute distress  Cardiovascular: Regular rate and rhythm, S1-S2  Respiratory: Clear to auscultation bilaterally   Discharge Instructions You were cared for by a hospitalist during your hospital stay. If you have any questions about your discharge medications or the care you received while you were in the hospital after you are discharged, you can call the unit and asked to speak with the hospitalist on call if the hospitalist that took care of you is not available. Once you are discharged, your primary care physician will handle any further medical issues. Please note that NO REFILLS for any discharge medications will be authorized once you are discharged, as it is imperative that you return to your primary care physician (or establish a relationship with a primary care physician if you do not have one) for your aftercare needs so that they can reassess your need for medications and monitor your lab values.  Discharge Instructions    Diet - low sodium heart healthy  Complete by:  As directed    Increase activity slowly    Complete by:  As directed      Allergies as of 11/07/2016   No Known Allergies     Medication List    TAKE these medications   diclofenac sodium 1 % Gel Commonly known as:  VOLTAREN Apply 2 g topically 4 (four) times daily as needed (right knee pain).   gabapentin 300 MG capsule Commonly known as:  NEURONTIN Take 1 capsule (300 mg total) by  mouth 3 (three) times daily. For agitation   hydrOXYzine 25 MG tablet Commonly known as:  ATARAX/VISTARIL Take 1 tablet (25 mg total) by mouth every 6 (six) hours as needed for anxiety.   levothyroxine 100 MCG tablet Commonly known as:  SYNTHROID, LEVOTHROID Take 1 tablet (100 mcg total) by mouth daily before breakfast. For low functioning Thyroid gland   QUEtiapine 25 MG tablet Commonly known as:  SEROQUEL Take 3 tablets (75 mg total) by mouth at bedtime. For mood control   traZODone 100 MG tablet Commonly known as:  DESYREL Take 1 tablet (100 mg total) by mouth at bedtime as needed for sleep.      No Known Allergies    The results of significant diagnostics from this hospitalization (including imaging, microbiology, ancillary and laboratory) are listed below for reference.    Significant Diagnostic Studies: Dg Chest 2 View  Result Date: 11/05/2016 CLINICAL DATA:  Hypotension and acute kidney injury. EXAM: CHEST  2 VIEW COMPARISON:  03/08/2013 FINDINGS: The cardiomediastinal contours are normal. Probable apical emphysema. Pulmonary vasculature is normal. No consolidation, pleural effusion, or pneumothorax. No acute osseous abnormalities are seen. IMPRESSION: No acute pulmonary process. Electronically Signed   By: Jeb Levering M.D.   On: 11/05/2016 04:47    Microbiology: No results found for this or any previous visit (from the past 240 hour(s)).   Labs: Basic Metabolic Panel:  Recent Labs Lab 11/05/16 0042 11/06/16 0511 11/07/16 0453  NA 139 139 138  K 4.1 4.8 4.3  CL 103 109 108  CO2 25 24 24   GLUCOSE 90 90 91  BUN 18 20 16   CREATININE 2.18* 1.38* 1.23  CALCIUM 9.3 8.7* 8.6*   Liver Function Tests:  Recent Labs Lab 11/05/16 0042  AST 68*  ALT 33  ALKPHOS 75  BILITOT 0.9  PROT 7.6  ALBUMIN 4.5   No results for input(s): LIPASE, AMYLASE in the last 168 hours. No results for input(s): AMMONIA in the last 168 hours. CBC:  Recent Labs Lab  11/05/16 0042 11/06/16 0511  WBC 7.9 5.7  HGB 12.1* 11.5*  HCT 35.8* 34.8*  MCV 102.0* 104.2*  PLT 255 251   Cardiac Enzymes: No results for input(s): CKTOTAL, CKMB, CKMBINDEX, TROPONINI in the last 168 hours. BNP: BNP (last 3 results) No results for input(s): BNP in the last 8760 hours.  ProBNP (last 3 results) No results for input(s): PROBNP in the last 8760 hours.  CBG: No results for input(s): GLUCAP in the last 168 hours.     Signed:  Annita Brod, MD [facility] 11/07/2016, 11:15 AM

## 2016-12-16 ENCOUNTER — Emergency Department (HOSPITAL_COMMUNITY)
Admission: EM | Admit: 2016-12-16 | Discharge: 2016-12-16 | Disposition: A | Discharge status: Home / Self Care | Payer: BLUE CROSS/BLUE SHIELD | Attending: Emergency Medicine | Admitting: Emergency Medicine

## 2016-12-16 ENCOUNTER — Encounter (HOSPITAL_COMMUNITY): Payer: Self-pay | Admitting: Emergency Medicine

## 2016-12-16 ENCOUNTER — Inpatient Hospital Stay (HOSPITAL_COMMUNITY)
Admission: AD | Admit: 2016-12-16 | Discharge: 2016-12-22 | DRG: 885 | Disposition: A | Discharge status: Home / Self Care | Payer: BLUE CROSS/BLUE SHIELD | Source: Intra-hospital | Attending: Psychiatry | Admitting: Psychiatry

## 2016-12-16 DIAGNOSIS — F332 Major depressive disorder, recurrent severe without psychotic features: Principal | ICD-10-CM | POA: Diagnosis present

## 2016-12-16 DIAGNOSIS — R45851 Suicidal ideations: Secondary | ICD-10-CM | POA: Diagnosis present

## 2016-12-16 DIAGNOSIS — F1420 Cocaine dependence, uncomplicated: Secondary | ICD-10-CM | POA: Diagnosis present

## 2016-12-16 DIAGNOSIS — I129 Hypertensive chronic kidney disease with stage 1 through stage 4 chronic kidney disease, or unspecified chronic kidney disease: Secondary | ICD-10-CM | POA: Diagnosis present

## 2016-12-16 DIAGNOSIS — Z915 Personal history of self-harm: Secondary | ICD-10-CM

## 2016-12-16 DIAGNOSIS — F32A Depression, unspecified: Secondary | ICD-10-CM

## 2016-12-16 DIAGNOSIS — Z79899 Other long term (current) drug therapy: Secondary | ICD-10-CM | POA: Insufficient documentation

## 2016-12-16 DIAGNOSIS — Z9114 Patient's other noncompliance with medication regimen: Secondary | ICD-10-CM | POA: Diagnosis not present

## 2016-12-16 DIAGNOSIS — N182 Chronic kidney disease, stage 2 (mild): Secondary | ICD-10-CM | POA: Insufficient documentation

## 2016-12-16 DIAGNOSIS — E039 Hypothyroidism, unspecified: Secondary | ICD-10-CM | POA: Diagnosis present

## 2016-12-16 DIAGNOSIS — F329 Major depressive disorder, single episode, unspecified: Secondary | ICD-10-CM | POA: Insufficient documentation

## 2016-12-16 DIAGNOSIS — Z590 Homelessness: Secondary | ICD-10-CM

## 2016-12-16 DIAGNOSIS — F1020 Alcohol dependence, uncomplicated: Secondary | ICD-10-CM | POA: Diagnosis present

## 2016-12-16 DIAGNOSIS — F1290 Cannabis use, unspecified, uncomplicated: Secondary | ICD-10-CM | POA: Diagnosis not present

## 2016-12-16 DIAGNOSIS — G8929 Other chronic pain: Secondary | ICD-10-CM

## 2016-12-16 DIAGNOSIS — F17210 Nicotine dependence, cigarettes, uncomplicated: Secondary | ICD-10-CM | POA: Diagnosis present

## 2016-12-16 DIAGNOSIS — G4700 Insomnia, unspecified: Secondary | ICD-10-CM | POA: Diagnosis not present

## 2016-12-16 DIAGNOSIS — F1490 Cocaine use, unspecified, uncomplicated: Secondary | ICD-10-CM | POA: Diagnosis not present

## 2016-12-16 DIAGNOSIS — M545 Low back pain: Secondary | ICD-10-CM | POA: Insufficient documentation

## 2016-12-16 HISTORY — DX: Homelessness: Z59.0

## 2016-12-16 HISTORY — DX: Other psychoactive substance dependence, uncomplicated: F19.20

## 2016-12-16 HISTORY — DX: Homelessness unspecified: Z59.00

## 2016-12-16 HISTORY — DX: Suicidal ideations: R45.851

## 2016-12-16 LAB — COMPREHENSIVE METABOLIC PANEL
ALBUMIN: 4.5 g/dL (ref 3.5–5.0)
ALT: 22 U/L (ref 17–63)
ANION GAP: 11 (ref 5–15)
AST: 44 U/L — ABNORMAL HIGH (ref 15–41)
Alkaline Phosphatase: 65 U/L (ref 38–126)
BILIRUBIN TOTAL: 0.8 mg/dL (ref 0.3–1.2)
BUN: 16 mg/dL (ref 6–20)
CO2: 20 mmol/L — ABNORMAL LOW (ref 22–32)
Calcium: 9.7 mg/dL (ref 8.9–10.3)
Chloride: 106 mmol/L (ref 101–111)
Creatinine, Ser: 1.24 mg/dL (ref 0.61–1.24)
Glucose, Bld: 93 mg/dL (ref 65–99)
POTASSIUM: 4 mmol/L (ref 3.5–5.1)
Sodium: 137 mmol/L (ref 135–145)
TOTAL PROTEIN: 7.7 g/dL (ref 6.5–8.1)

## 2016-12-16 LAB — RAPID URINE DRUG SCREEN, HOSP PERFORMED
Amphetamines: NOT DETECTED
Barbiturates: NOT DETECTED
Benzodiazepines: NOT DETECTED
Cocaine: POSITIVE — AB
OPIATES: NOT DETECTED
TETRAHYDROCANNABINOL: POSITIVE — AB

## 2016-12-16 LAB — CBC
HCT: 37.9 % — ABNORMAL LOW (ref 39.0–52.0)
Hemoglobin: 12.7 g/dL — ABNORMAL LOW (ref 13.0–17.0)
MCH: 34.2 pg — ABNORMAL HIGH (ref 26.0–34.0)
MCHC: 33.5 g/dL (ref 30.0–36.0)
MCV: 102.2 fL — ABNORMAL HIGH (ref 78.0–100.0)
PLATELETS: 264 10*3/uL (ref 150–400)
RBC: 3.71 MIL/uL — ABNORMAL LOW (ref 4.22–5.81)
RDW: 12.4 % (ref 11.5–15.5)
WBC: 7.3 10*3/uL (ref 4.0–10.5)

## 2016-12-16 LAB — SALICYLATE LEVEL: Salicylate Lvl: <7 mg/dL (ref 2.8–30.0)

## 2016-12-16 LAB — ACETAMINOPHEN LEVEL

## 2016-12-16 LAB — ETHANOL

## 2016-12-16 MED ORDER — QUETIAPINE FUMARATE 25 MG PO TABS: 75.0000 mg | ORAL_TABLET | Freq: Every day | ORAL | Status: DC

## 2016-12-16 MED ORDER — TRAZODONE HCL 50 MG PO TABS: 50.0000 mg | ORAL_TABLET | Freq: Every evening | ORAL | Status: DC | PRN

## 2016-12-16 MED ORDER — LORAZEPAM 1 MG PO TABS: 1.0000 mg | ORAL_TABLET | Freq: Three times a day (TID) | ORAL | Status: DC | PRN

## 2016-12-16 MED ORDER — TRAZODONE HCL 100 MG PO TABS: 100.0000 mg | ORAL_TABLET | Freq: Every evening | ORAL | Status: DC | PRN

## 2016-12-16 MED ORDER — IBUPROFEN 400 MG PO TABS: 600.0000 mg | ORAL_TABLET | Freq: Three times a day (TID) | ORAL | Status: DC | PRN

## 2016-12-16 MED ORDER — LEVOTHYROXINE SODIUM 100 MCG PO TABS
100.0000 ug | ORAL_TABLET | Freq: Every day | ORAL | Status: DC
  Administered 2016-12-17 – 2016-12-22 (×6): 100 ug via ORAL
  Filled 2016-12-16 (×8): qty 1

## 2016-12-16 MED ORDER — LEVOTHYROXINE SODIUM 100 MCG PO TABS
100.0000 ug | ORAL_TABLET | Freq: Every day | ORAL | Status: DC
  Administered 2016-12-16: 100 ug via ORAL
  Filled 2016-12-16 (×2): qty 1

## 2016-12-16 MED ORDER — ACETAMINOPHEN 325 MG PO TABS
650.0000 mg | ORAL_TABLET | Freq: Four times a day (QID) | ORAL | Status: DC | PRN
  Administered 2016-12-17 – 2016-12-19 (×4): 650 mg via ORAL
  Filled 2016-12-16 (×4): qty 2

## 2016-12-16 MED ORDER — GABAPENTIN 300 MG PO CAPS
300.0000 mg | ORAL_CAPSULE | Freq: Three times a day (TID) | ORAL | Status: DC
  Administered 2016-12-16 (×2): 300 mg via ORAL
  Filled 2016-12-16 (×2): qty 1

## 2016-12-16 MED ORDER — HYDROXYZINE HCL 25 MG PO TABS
25.0000 mg | ORAL_TABLET | Freq: Four times a day (QID) | ORAL | Status: DC | PRN
  Administered 2016-12-17 – 2016-12-19 (×4): 25 mg via ORAL
  Filled 2016-12-16 (×4): qty 1

## 2016-12-16 MED ORDER — MAGNESIUM HYDROXIDE 400 MG/5ML PO SUSP: 30.0000 mL | Freq: Every day | ORAL | Status: DC | PRN

## 2016-12-16 MED ORDER — GABAPENTIN 300 MG PO CAPS
300.0000 mg | ORAL_CAPSULE | Freq: Three times a day (TID) | ORAL | Status: DC
  Administered 2016-12-17 – 2016-12-19 (×7): 300 mg via ORAL
  Filled 2016-12-16 (×13): qty 1

## 2016-12-16 MED ORDER — ALUM & MAG HYDROXIDE-SIMETH 200-200-20 MG/5ML PO SUSP: 30.0000 mL | ORAL | Status: DC | PRN

## 2016-12-16 MED ORDER — QUETIAPINE FUMARATE 50 MG PO TABS
75.0000 mg | ORAL_TABLET | Freq: Every day | ORAL | Status: DC
  Administered 2016-12-16 – 2016-12-18 (×3): 75 mg via ORAL
  Filled 2016-12-16 (×7): qty 1

## 2016-12-16 NOTE — ED Provider Notes (Signed)
[facility] DEPT Provider Note   CSN: [id number] Arrival date & time: 12/16/16  0207 By signing my name below, I, Dyke Brackett, attest that this documentation has been prepared under the direction and in the presence of Ripley Fraise, MD . Electronically Signed: Dyke Brackett, Scribe. 12/16/2016. 3:16 AM.   History   Chief Complaint Chief Complaint  Patient presents with  . Suicidal   HPI Johnathan Baker is a 46 y.o. male with a PMHx of back pain, depression, drug addiction, homelessness and suicidal ideation who presents to the Emergency Department complaining of intermittent suicidal ideation today. Pt states he feels "hopeless" and is "trying to get some help one more time". Pt states he has been seen at [facility] in the past and feels as if he needs to go to rehab. He has been complaint with his medication with no significant relief. He reports gradually worsening chronic back pain and associated weakness to his right knee and RLE. He denies abdominal pain, nausea, vomiting, or any other symptoms at this time.   The history is provided by the patient. No language interpreter was used.   Past Medical History:  Diagnosis Date  . Back pain   . Depression   . Drug addiction ([place])   . Homelessness   . Hypertension   . Suicidal ideation   . Thyroid disease    Patient Active Problem List   Diagnosis Date Noted  . CKD (chronic kidney disease), symptom management only, stage 2 (mild) 11/06/2016  . AKI (acute kidney injury) ([place]) 11/05/2016  . Major depressive disorder, recurrent severe without psychotic features ([place]) 09/23/2016  . MDD (major depressive disorder), recurrent severe, without psychosis ([place]) 09/23/2016  . Cocaine abuse 12/21/2015  . Major depressive disorder, recurrent episode ([place]) 11/08/2015  . Alcohol dependence with uncomplicated withdrawal ([place])   . Thyroid activity decreased   . Cocaine dependence with cocaine-induced mood disorder ([place]) 10/03/2015  .  Alcohol dependence ([place]) 10/03/2015  . Hypothyroidism 10/03/2015    History reviewed. No pertinent surgical history.    Home Medications    Prior to Admission medications   Medication Sig Start Date End Date Taking? Authorizing Provider  diclofenac sodium (VOLTAREN) 1 % GEL Apply 2 g topically 4 (four) times daily as needed (right knee pain). 09/30/16   Encarnacion Slates, NP  gabapentin (NEURONTIN) 300 MG capsule Take 1 capsule (300 mg total) by mouth 3 (three) times daily. For agitation 09/30/16   Encarnacion Slates, NP  hydrOXYzine (ATARAX/VISTARIL) 25 MG tablet Take 1 tablet (25 mg total) by mouth every 6 (six) hours as needed for anxiety. 09/30/16   Encarnacion Slates, NP  levothyroxine (SYNTHROID, LEVOTHROID) 100 MCG tablet Take 1 tablet (100 mcg total) by mouth daily before breakfast. For low functioning Thyroid gland 09/30/16   Encarnacion Slates, NP  QUEtiapine (SEROQUEL) 25 MG tablet Take 3 tablets (75 mg total) by mouth at bedtime. For mood control 09/30/16   Encarnacion Slates, NP  traZODone (DESYREL) 100 MG tablet Take 1 tablet (100 mg total) by mouth at bedtime as needed for sleep. 09/30/16   Encarnacion Slates, NP    Family History No family history on file.  Social History Social History  Substance Use Topics  . Smoking status: Current Every Day Smoker    Packs/day: 0.50    Types: Cigarettes  . Smokeless tobacco: Never Used  . Alcohol use Yes     Comment: binge drinks    Allergies   Patient has  no known allergies.  Review of Systems Review of Systems 10 systems reviewed and all are negative for acute change except as noted in the HPI.   Physical Exam Updated Vital Signs BP 144/92   Pulse 92   Temp 97.4 F (36.3 C) (Oral)   Resp 16   Ht 6\' 5"  (1.956 m)   Wt 109.8 kg   SpO2 100%   BMI 28.70 kg/m   Physical Exam CONSTITUTIONAL: Well developed/well nourished HEAD: Normocephalic/atraumatic ENMT: Mucous membranes moist NECK: supple no meningeal signs SPINE/BACK: lumbar  paraspinal tenderness CV: S1/S2 noted, no murmurs/rubs/gallops noted LUNGS: Lungs are clear to auscultation bilaterally, no apparent distress ABDOMEN: soft, nontender NEURO: Pt is awake/alert/appropriate, moves all extremitiesx4.  No facial droop.  No focal weakness noted to the lower extremities.  Pt is ambulatory EXTREMITIES: pulses normal/equal, full ROM SKIN: warm, color normal PSYCH: no abnormalities of mood noted, alert and oriented to situation   ED Treatments / Results  DIAGNOSTIC STUDIES:  Oxygen Saturation is 98% on RA, normal by my interpretation.    COORDINATION OF CARE:  3:14 AM Discussed treatment plan with pt at bedside and pt agreed to plan.  Labs (all labs ordered are listed, but only abnormal results are displayed) Labs Reviewed  COMPREHENSIVE METABOLIC PANEL - Abnormal; Notable for the following:       Result Value   CO2 20 (*)    AST 44 (*)    All other components within normal limits  ACETAMINOPHEN LEVEL - Abnormal; Notable for the following:    Acetaminophen (Tylenol), Serum <10 (*)    All other components within normal limits  CBC - Abnormal; Notable for the following:    RBC 3.71 (*)    Hemoglobin 12.7 (*)    HCT 37.9 (*)    MCV 102.2 (*)    MCH 34.2 (*)    All other components within normal limits  RAPID URINE DRUG SCREEN, HOSP PERFORMED - Abnormal; Notable for the following:    Cocaine POSITIVE (*)    Tetrahydrocannabinol POSITIVE (*)    All other components within normal limits  ETHANOL  SALICYLATE LEVEL    EKG  EKG Interpretation None       Radiology No results found.  Procedures Procedures   Medications Ordered in ED Medications  ibuprofen (ADVIL,MOTRIN) tablet 600 mg (not administered)  LORazepam (ATIVAN) tablet 1 mg (not administered)  traZODone (DESYREL) tablet 100 mg (not administered)  gabapentin (NEURONTIN) capsule 300 mg (not administered)  levothyroxine (SYNTHROID, LEVOTHROID) tablet 100 mcg (not administered)    QUEtiapine (SEROQUEL) tablet 75 mg (not administered)     Initial Impression / Assessment and Plan / ED Course  I have reviewed the triage vital signs and the nursing notes.  Pertinent labs  results that were available during my care of the patient were reviewed by me and considered in my medical decision making (see chart for details).     4:06 AM Pt stable He is awake/alert, no distress He is medically stable Psych consulted Home meds ordered Final Clinical Impressions(s) / ED Diagnoses   Final diagnoses:  Depression, unspecified depression type  Chronic low back pain, unspecified back pain laterality, with sciatica presence unspecified    New Prescriptions New Prescriptions   No medications on file  I personally performed the services described in this documentation, which was scribed in my presence. The recorded information has been reviewed and is accurate.        Ripley Fraise, MD 12/16/16 [phone]

## 2016-12-16 NOTE — ED Notes (Signed)
Pt given [place] sandwich, graham crackers, peanut butter, and sprite per request. Pt watching TV. Sitter at bedside

## 2016-12-16 NOTE — ED Notes (Signed)
Lunch order placed

## 2016-12-16 NOTE — ED Notes (Signed)
Dinner order placed 

## 2016-12-16 NOTE — Progress Notes (Signed)
12/16/16  CSW notified that pt. accepted to [facility] Rm. 304-1.  Accepting MD Is Dr. Parke Poisson. Call report to [phone]  CSW called [facility] ED andd spoke to Nurse, Rachel Bo, who will pass information to pts nurse.  Disposition: Awaiting discharge from [facility] ED and transport to [facility] T. Judi Cong, MSW, [place] Work Disposition [phone]

## 2016-12-16 NOTE — ED Notes (Addendum)
Purple scrubs given to pt. , staffing office/charge nurse notified for pt.'s sitter , security notified to wand pt.

## 2016-12-16 NOTE — ED Notes (Signed)
Lunch meal given. 

## 2016-12-16 NOTE — ED Notes (Signed)
Snacks given 

## 2016-12-16 NOTE — ED Notes (Signed)
TTS at bedside. 

## 2016-12-16 NOTE — ED Triage Notes (Addendum)
Pt. reports suicidal ideation plans to overdose on medications , denies hallucinations . Pt. stated homelessness and Cocaine use today.
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
## 2016-12-16 NOTE — ED Notes (Signed)
[place] sandwich and apple juice given.

## 2016-12-16 NOTE — ED Notes (Signed)
Family at bedside. 

## 2016-12-17 ENCOUNTER — Encounter (HOSPITAL_COMMUNITY): Payer: Self-pay

## 2016-12-17 DIAGNOSIS — Z79899 Other long term (current) drug therapy: Secondary | ICD-10-CM

## 2016-12-17 DIAGNOSIS — F332 Major depressive disorder, recurrent severe without psychotic features: Principal | ICD-10-CM

## 2016-12-17 DIAGNOSIS — N182 Chronic kidney disease, stage 2 (mild): Secondary | ICD-10-CM

## 2016-12-17 DIAGNOSIS — G4700 Insomnia, unspecified: Secondary | ICD-10-CM

## 2016-12-17 MED ORDER — NICOTINE POLACRILEX 2 MG MT GUM: 2.0000 mg | CHEWING_GUM | OROMUCOSAL | Status: DC | PRN

## 2016-12-17 MED ORDER — MIRTAZAPINE 7.5 MG PO TABS
7.5000 mg | ORAL_TABLET | Freq: Every day | ORAL | Status: DC
  Administered 2016-12-17: 7.5 mg via ORAL
  Filled 2016-12-17 (×4): qty 1

## 2016-12-17 NOTE — Progress Notes (Signed)
Patient ID: Johnathan Baker, male   DOB: 11/18/1970, 46 y.o.   MRN: [id number]  Admission note: D:Patient is a voluntary admission in no acute distress for depression, suicidal ideation and substance abuse. Pt reports he drinks about 5-- 25oz natural ice weekly. Pt reports using marijuana and cocaine. Last use was yesterday about $60. Pt reports having assault charges pending. Pt reports goal is to stay sober and get medication in order.  A: Pt admitted to unit per protocol, skin assessment and belonging search done. No skin issues noted. Consent signed by pt. Pt educated on therapeutic milieu rules. Pt was introduced to milieu by nursing staff. Fall risk safety plan explained to the patient. 15 minutes checks started for safety.  R: Pt was receptive to education. Writer offered support.

## 2016-12-17 NOTE — Progress Notes (Signed)
Nursing Progress Note: 7p-7a D: Pt currently presents with a flat/depressed affect and behavior. Pt states "I never wanna hurt myself. I need to get my addiction under control."  Pt reports ok sleep with current medication regimen.   A: Pt provided with medications per providers orders. Pt's labs and vitals were monitored throughout the night. Pt supported emotionally and encouraged to express concerns and questions. Pt educated on medications.  R: Pt's safety ensured with 15 minute and environmental checks. Pt currently denies SI/HI/Self Harm and A/V hallucinations. Pt verbally contracts to seek staff if SI/HI or A/VH occurs and to consult with staff before acting on any harmful thoughts. Will continue to monitor.
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
## 2016-12-17 NOTE — BHH Suicide Risk Assessment (Signed)
[facility] Admission Suicide Risk Assessment   Nursing information obtained from:  Patient Demographic factors:  Male, Unemployed, Living alone Current Mental Status:  Suicidal ideation indicated by patient, Self-harm thoughts Loss Factors:  Decrease in vocational status, Legal issues, Financial problems / change in socioeconomic status Historical Factors:  Prior suicide attempts, Family history of mental illness or substance abuse Risk Reduction Factors:  NA  Total Time spent with patient: 45 minutes Principal Problem: MDD (major depressive disorder), recurrent severe, without psychosis ([place]) Diagnosis:   Patient Active Problem List   Diagnosis Date Noted  . CKD (chronic kidney disease), symptom management only, stage 2 (mild) [N18.2] 11/06/2016  . AKI (acute kidney injury) ([place]) [N17.9] 11/05/2016  . Major depressive disorder, recurrent severe without psychotic features ([place]) [F33.2] 09/23/2016  . MDD (major depressive disorder), recurrent severe, without psychosis ([place]) [F33.2] 09/23/2016  . Cocaine abuse [F14.10] 12/21/2015  . Major depressive disorder, recurrent episode ([place]) [F33.9] 11/08/2015  . Alcohol dependence with uncomplicated withdrawal ([place]) [F10.230]   . Thyroid activity decreased [E03.9]   . Cocaine dependence with cocaine-induced mood disorder ([place]) [F14.24] 10/03/2015  . Alcohol dependence ([place]) [F10.20] 10/03/2015  . Hypothyroidism [E03.9] 10/03/2015   Subjective Data: Patient is 46 year old African-American male who was admitted due to having severe depression and feeling hopeless helpless worthless and having suicidal thoughts but no plan.  Patient is recently kicked out and had assault charges.  He is using drugs.  Patient was hospitalized to behavioral [facility] in the past.  Patient has history of suicidal attempt in the past and being in the prison for 10 years.  His UDS is positive for cocaine and cannabis.  Patient is noncompliant with medication.  Patient requires  inpatient treatment and stabilization.  Please see history and physical for more information.  Continued Clinical Symptoms:    The "Alcohol Use Disorders Identification Test", Guidelines for Use in Primary Care, Second Edition.  World Pharmacologist [facility]). Score between 0-7:  no or low risk or alcohol related problems. Score between 8-15:  moderate risk of alcohol related problems. Score between 16-19:  high risk of alcohol related problems. Score 20 or above:  warrants further diagnostic evaluation for alcohol dependence and treatment.   CLINICAL FACTORS:   Alcohol/Substance Abuse/Dependencies   Musculoskeletal: Strength & Muscle Tone: within normal limits Gait & Station: normal Patient leans: N/A  Psychiatric Specialty Exam: Physical Exam  ROS  Blood pressure 133/88, pulse 62, temperature 98.5 F (36.9 C), resp. rate 18, height 6' 5.01" (1.956 m), weight 109.8 kg (242 lb).Body mass index is 28.69 kg/m.  General Appearance: Fairly Groomed and Guarded  Eye Contact:  Fair  Speech:  Slow  Volume:  Decreased  Mood:  Anxious, Depressed and Dysphoric  Affect:  Constricted and Depressed  Thought Process:  Goal Directed  Orientation:  Full (Time, Place, and Person)  Thought Content:  Rumination  Suicidal Thoughts:  Yes.  without intent/plan  Homicidal Thoughts:  No  Memory:  Immediate;   Fair Recent;   Fair Remote;   Fair  Judgement:  Fair  Insight:  Fair  Psychomotor Activity:  Decreased  Concentration:  Concentration: Fair and Attention Span: Fair  Recall:  [employer] of Knowledge:  Fair  Language:  Good  Akathisia:  No  Handed:  Right  AIMS (if indicated):     Assets:  Desire for Improvement  ADL's:  Intact  Cognition:  WNL  Sleep:  Number of Hours: 5.25      COGNITIVE FEATURES  THAT CONTRIBUTE TO RISK:  Thought constriction (tunnel vision)    SUICIDE RISK:   Mild:  Suicidal ideation of limited frequency, intensity, duration, and specificity.  There are  no identifiable plans, no associated intent, mild dysphoria and related symptoms, good self-control (both objective and subjective assessment), few other risk factors, and identifiable protective factors, including available and accessible social support.  PLAN OF CARE: Patient is 47 year old African-American male with known history of drug use, admitted due to severe depression and having suicidal thoughts but no plan.  Patient is using drugs and UDS is positive for cocaine and cannabis.  Patient requires inpatient treatment and stabilization .  Please see history and physical for more detailed plan of care.  I certify that inpatient services furnished can reasonably be expected to improve the patient's condition.   Rilen Shukla T., MD 12/17/2016, 4:36 PM

## 2016-12-17 NOTE — BHH Suicide Risk Assessment (Signed)
[facility] INPATIENT:  Family/Significant Other Suicide Prevention Education  Suicide Prevention Education:   Patient Refusal for Family/Significant Other Suicide Prevention Education: The patient Johnathan Baker has refused to provide written consent for family/significant other to be provided Family/Significant Other Suicide Prevention Education during admission and/or prior to discharge.  Physician notified.  Writer provided suicide prevention education directly to patient; conversation included risk factors, warning signs and resources to contact for help. Mobile crisis services explained and  explanations given as to resources which will be included on patient's discharge paperwork.   Sheilah Pigeon

## 2016-12-17 NOTE — Progress Notes (Signed)
[facility] Group Notes:  (Nursing/MHT/Case Management/Adjunct)  Date:  12/17/2016  Time:  10:45 PM  Type of Therapy:  Psychoeducational Skills  Participation Level:  Minimal  Participation Quality:  Appropriate  Affect:  Appropriate  Cognitive:  Lacking  Insight:  Limited  Engagement in Group:  Limited  Modes of Intervention:  Education  Summary of Progress/Problems: The patient stated that he slept a great deal today. His goal for tomorrow is to attend recreation and to attend more of his meetings.   Archie Balboa S 12/17/2016, 10:45 PM

## 2016-12-17 NOTE — BHH Group Notes (Signed)
[facility] LCSW Group Therapy Note  12/17/2016  at  10:15 AM  Type of Therapy and Topic:  Group Therapy: Avoiding Self-Sabotaging and Engaging in Radical Acceptance  Participation Level:  Minimal  Participation Quality:  Attentive  Affect:  Appropriate  Cognitive:  Alert and Oriented  Insight:  None shared  Engagement in Therapy:  Limited   Therapeutic models used: Cognitive Behavioral Therapy,  Person-Centered Therapy and Motivational Interviewing  Modes of Intervention:  Discussion, Exploration, Orientation, Rapport Building, Socialization and Support   Summary of Progress/Problems:  The main focus of today's process group was for the patient to identify ways in which they may be able to engage in Radical Acceptance verses self sabotage. Motivational Interviewing was utilized to identify motivation they may have for wanting to change. The group processed multiple fears associated with change including the fear of success. Patient was attentive throughout group as evidenced by his eye contact. Patient identified with fear of success and is focused on seeking longer term treatment.   Sheilah Pigeon, LCSW
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
## 2016-12-17 NOTE — [facility] Group Notes (Signed)
[place] Group Notes:  (Nursing/MHT/Case Management/Adjunct)  Date:  12/17/2016  Time:  2:23 PM  Type of Therapy:  Nurse Education  Participation Level:  Did Not Attend   Cheri Kearns 12/17/2016, 2:23 PM

## 2016-12-17 NOTE — Plan of Care (Signed)
Problem: Activity: Goal: Imbalance in normal sleep/wake cycle will improve Outcome: Not Progressing Patient has spent most of this shift in bed napping, even after encouragement to get up and participate in activities and the milieu.

## 2016-12-17 NOTE — Progress Notes (Signed)
Data. Patient denies SI/HI/AVH. Patient has been isolating in his room for most of the day isolating in his room. His affect has been flat, but will brighten with interaction. Does not initiate interaction. He reports, "Not feeling well." On his self assessment patient reports 6/10 for depression and hopelessness and 5/10 for anxiety. His goal for today is: "Go to meetings and take meds." Action. Emotional support and encouragement offered. Education provided on medication, indications and side effect. Q 15 minute checks done for safety. Response. Safety on the unit maintained through 15 minute checks.  Medications taken as prescribed. Attended groups. Remained calm and appropriate through out shift.

## 2016-12-17 NOTE — Tx Team (Addendum)
Initial Treatment Plan 12/17/2016 12:34 AM [name] [id number]    PATIENT STRESSORS: Financial difficulties Legal issue Medication change or noncompliance Substance abuse   PATIENT STRENGTHS: Average or above average intelligence Capable of independent living General fund of knowledge Motivation for treatment/growth   PATIENT IDENTIFIED PROBLEMS: depression  anxiety  Suicide ideation  Substance abuse  "I want to get my addiction under control."  "I need help with depression"           DISCHARGE CRITERIA:  Adequate post-discharge living arrangements Improved stabilization in mood, thinking, and/or behavior Withdrawal symptoms are absent or subacute and managed without 24-hour nursing intervention  PRELIMINARY DISCHARGE PLAN: Attend PHP/IOP Attend 12-step recovery group Outpatient therapy Return to previous living arrangement  PATIENT/FAMILY INVOLVEMENT: This treatment plan has been presented to and reviewed with the patient, Johnathan Baker,  The patient and family have been given the opportunity to ask questions and make suggestions.  Mal Misty, RN 12/17/2016, 12:34 AM

## 2016-12-17 NOTE — Progress Notes (Signed)
Nursing Progress Note: 7p-7a D: Pt currently presents with a sad/worried affect and behavior. Pt states "i'm worried I won't be able to get into a program because of my court date." Interacting minimally with milieu. Pt reports good sleep with current medication regimen.   A: Pt provided with medications per providers orders. Pt's labs and vitals were monitored throughout the night. Pt supported emotionally and encouraged to express concerns and questions. Pt educated on medications.  R: Pt's safety ensured with 15 minute and environmental checks. Pt currently denies SI/HI/Self Harm and A/V hallucinations. Pt verbally contracts to seek staff if SI/HI or A/VH occurs and to consult with staff before acting on any harmful thoughts. Will continue to monitor.

## 2016-12-18 MED ORDER — MIRTAZAPINE 15 MG PO TABS
15.0000 mg | ORAL_TABLET | Freq: Every day | ORAL | Status: DC
  Administered 2016-12-18: 15 mg via ORAL
  Filled 2016-12-18 (×4): qty 1

## 2016-12-18 MED ORDER — HYDROXYZINE HCL 50 MG PO TABS
50.0000 mg | ORAL_TABLET | Freq: Once | ORAL | Status: DC | PRN
  Filled 2016-12-18: qty 1

## 2016-12-18 NOTE — Progress Notes (Signed)
Data. Patient denies HI/AVH.  Patient does endorse passive SI without a plan and is able to contract for safety on the unit and to come to staff before acting on any self harm thoughts/feelings. patient did spend most of the morning isolating in bed, but did get up and join the milieu for lunch and in the afternoon. Patient interacting well with staff and other patients. Patient did report he was feeling anxious and, "Like I am going to explode in this place." Patient reports he has a court date in March. On his self assessment patient reports 6/10 for depression and 8/10 for anxiety and hopelessness. His goal for today is: "Stay calm." Action. Emotional support and encouragement offered. Education provided on medication, indications and side effect. Q 15 minute checks done for safety. Response. Safety on the unit maintained through 15 minute checks.  Medications taken as prescribed. Attended groups. Remained calm and appropriate through out shift.

## 2016-12-18 NOTE — Progress Notes (Signed)
[facility] MD Progress Note  12/18/2016 12:04 PM Johnathan Baker  MRN:  [id number] Subjective:  I still feel depressed and sad.  I still have suicidal thoughts.  Objective; Patient seen chart reviewed.  Patient continued to endorse passive and fleeting suicidal thoughts but no plan.  He still feels sad depressed and isolated.  He did not participate in groups because he was very depressed and hopeless.  He has no family and he is not sure where he is going to live after discharge.  Patient is using drugs and noncompliant with medication.  Currently he is taking Seroquel and Remeron.  His sleep is fair but he still have racing thoughts, ruminating thoughts and passive and fleeting suicidal thoughts.  Principal Problem: MDD (major depressive disorder), recurrent severe, without psychosis ([place]) Diagnosis:   Patient Active Problem List   Diagnosis Date Noted  . CKD (chronic kidney disease), symptom management only, stage 2 (mild) [N18.2] 11/06/2016  . AKI (acute kidney injury) ([place]) [N17.9] 11/05/2016  . Major depressive disorder, recurrent severe without psychotic features ([place]) [F33.2] 09/23/2016  . MDD (major depressive disorder), recurrent severe, without psychosis ([place]) [F33.2] 09/23/2016  . Cocaine abuse [F14.10] 12/21/2015  . Major depressive disorder, recurrent episode ([place]) [F33.9] 11/08/2015  . Alcohol dependence with uncomplicated withdrawal ([place]) [F10.230]   . Thyroid activity decreased [E03.9]   . Cocaine dependence with cocaine-induced mood disorder ([place]) [F14.24] 10/03/2015  . Alcohol dependence ([place]) [F10.20] 10/03/2015  . Hypothyroidism [E03.9] 10/03/2015   Total Time spent with patient: 30 minutes  Past Psychiatric History: Reviewed.  Past Medical History:  Past Medical History:  Diagnosis Date  . Back pain   . Depression   . Drug addiction ([place])   . Homelessness   . Hypertension   . Suicidal ideation   . Thyroid disease    History reviewed. No pertinent surgical  history. Family History: History reviewed. No pertinent family history. Family Psychiatric  History: Reviewed. Social History:  History  Alcohol Use  . Yes    Comment: binge drinks     History  Drug Use  . Types: Cocaine, Marijuana    Social History   Social History  . Marital status: Married    Spouse name: N/A  . Number of children: N/A  . Years of education: N/A   Social History Main Topics  . Smoking status: Current Every Day Smoker    Packs/day: 0.50    Types: Cigarettes  . Smokeless tobacco: Never Used  . Alcohol use Yes     Comment: binge drinks  . Drug use: Yes    Types: Cocaine, Marijuana  . Sexual activity: Yes   Other Topics Concern  . None   Social History Narrative  . None   Additional Social History:                         Sleep: Fair  Appetite:  Fair  Current Medications: Current Facility-Administered Medications  Medication Dose Route Frequency Provider Last Rate Last Dose  . acetaminophen (TYLENOL) tablet 650 mg  650 mg Oral Q6H PRN Ethelene Hal, NP   650 mg at 12/18/16 0850  . alum & mag hydroxide-simeth (MAALOX/MYLANTA) 200-200-20 MG/5ML suspension 30 mL  30 mL Oral Q4H PRN Ethelene Hal, NP      . gabapentin (NEURONTIN) capsule 300 mg  300 mg Oral TID Ethelene Hal, NP   300 mg at 12/18/16 0848  . hydrOXYzine (ATARAX/VISTARIL) tablet 25 mg  25  mg Oral Q6H PRN Ethelene Hal, NP   25 mg at 12/18/16 0850  . levothyroxine (SYNTHROID, LEVOTHROID) tablet 100 mcg  100 mcg Oral QAC breakfast Ethelene Hal, NP   100 mcg at 12/18/16 [id number]  . magnesium hydroxide (MILK OF MAGNESIA) suspension 30 mL  30 mL Oral Daily PRN Ethelene Hal, NP      . mirtazapine (REMERON) tablet 7.5 mg  7.5 mg Oral QHS Derrill [place], NP   7.5 mg at 12/17/16 2131  . nicotine polacrilex (NICORETTE) gum 2 mg  2 mg Oral PRN Jenne Campus, MD      . QUEtiapine (SEROQUEL) tablet 75 mg  75 mg Oral QHS Ethelene Hal, NP    75 mg at 12/17/16 2128    Lab Results: No results found for this or any previous visit (from the past 54 hour(s)).  Blood Alcohol level:  Lab Results  Component Value Date   ETH <5 12/16/2016   ETH <5 0000000    Metabolic Disorder Labs: Lab Results  Component Value Date   HGBA1C 4.8 09/28/2016   MPG 91 09/28/2016   MPG 94 09/27/2016   No results found for: PROLACTIN Lab Results  Component Value Date   CHOL 152 09/27/2016   TRIG 111 09/27/2016   HDL 65 09/27/2016   CHOLHDL 2.3 09/27/2016   VLDL 22 09/27/2016   LDLCALC 65 09/27/2016   LDLCALC 73 02/11/2014    Physical Findings: AIMS: Facial and Oral Movements Muscles of Facial Expression: None, normal Lips and Perioral Area: None, normal Jaw: None, normal Tongue: None, normal,Extremity Movements Upper (arms, wrists, hands, fingers): None, normal Lower (legs, knees, ankles, toes): None, normal, Trunk Movements Neck, shoulders, hips: None, normal, Overall Severity Severity of abnormal movements (highest score from questions above): None, normal Incapacitation due to abnormal movements: None, normal Patient's awareness of abnormal movements (rate only patient's report): No Awareness, Dental Status Current problems with teeth and/or dentures?: No Does patient usually wear dentures?: No  CIWA:  CIWA-Ar Total: 1 COWS:     Musculoskeletal: Strength & Muscle Tone: within normal limits Gait & Station: normal Patient leans: N/A  Psychiatric Specialty Exam: Physical Exam  Review of Systems  Neurological: Negative.   Psychiatric/Behavioral: Positive for depression and suicidal ideas. The patient is nervous/anxious and has insomnia.     Blood pressure (!) 143/95, pulse 63, temperature 99.1 F (37.3 C), resp. rate 16, height 6' 5.01" (1.956 m), weight 109.8 kg (242 lb).Body mass index is 28.69 kg/m.  General Appearance: Casual and Fairly Groomed  Eye Contact:  Fair  Speech:  Slow  Volume:  Decreased  Mood:   Anxious, Depressed and Dysphoric  Affect:  Constricted and Depressed  Thought Process:  Goal Directed  Orientation:  Full (Time, Place, and Person)  Thought Content:  Rumination  Suicidal Thoughts:  Yes.  without intent/plan  Homicidal Thoughts:  No  Memory:  Immediate;   Fair Recent;   Fair Remote;   Fair  Judgement:  Fair  Insight:  Fair  Psychomotor Activity:  Decreased  Concentration:  Concentration: Fair and Attention Span: Fair  Recall:  [employer] of Knowledge:  Fair  Language:  Good  Akathisia:  No  Handed:  Right  AIMS (if indicated):     Assets:  Communication Skills Desire for Improvement  ADL's:  Intact  Cognition:  WNL  Sleep:  Number of Hours: 6     Treatment Plan Summary: Medication management and Patient continued to exhibit  symptoms of depression and suicidal thoughts.  Increase Remeron 15 mg at bedtime, Continue Seroquel 75 mg for mood stabilization and trazodone 50 mg at bedtime as needed for insomnia. Reviewed blood work results, complete has a metabolic panel normal except for AST which is 44.  His CBC shows hemoglobin 12.7, RBC 3.71, hematocrit 37.9 his UDS is positive for cocaine and cannabis.  Patient has no tremors or shakes.  Discussed medication side effects and benefits. Encouraged to participate in group milieu therapy. Social worker to start discharge planning.  Alexys Lobello T., MD 12/18/2016, 12:04 PM

## 2016-12-18 NOTE — [facility] Group Notes (Signed)
[facility] LCSW Group Therapy Note   12/18/2016  10:15  AM   Type of Therapy and Topic: Group Therapy: Feelings Around Returning Home & Establishing a Supportive Framework and Activity to Identify signs of Improvement or Decompensation   Participation Level: Did Not Attend invited to participate yet did not despite overhead announcement and encouragement by staff   Sheilah Pigeon, LCSW

## 2016-12-18 NOTE — Plan of Care (Signed)
Problem: Activity: Goal: Interest or engagement in leisure activities will improve Outcome: Not Progressing Patient continues to isolate in his room, except for meals.

## 2016-12-18 NOTE — Progress Notes (Signed)
The patient attended the  A.A. Meeting and was appropriate.   

## 2016-12-19 DIAGNOSIS — F17210 Nicotine dependence, cigarettes, uncomplicated: Secondary | ICD-10-CM

## 2016-12-19 DIAGNOSIS — R45851 Suicidal ideations: Secondary | ICD-10-CM

## 2016-12-19 DIAGNOSIS — F1490 Cocaine use, unspecified, uncomplicated: Secondary | ICD-10-CM

## 2016-12-19 DIAGNOSIS — F1290 Cannabis use, unspecified, uncomplicated: Secondary | ICD-10-CM

## 2016-12-19 MED ORDER — LISINOPRIL 10 MG PO TABS
10.0000 mg | ORAL_TABLET | Freq: Every day | ORAL | Status: DC
  Filled 2016-12-19 (×2): qty 1

## 2016-12-19 MED ORDER — QUETIAPINE FUMARATE ER 50 MG PO TB24
100.0000 mg | ORAL_TABLET | Freq: Every day | ORAL | Status: DC
  Administered 2016-12-19 – 2016-12-21 (×3): 100 mg via ORAL
  Filled 2016-12-19 (×4): qty 2

## 2016-12-19 MED ORDER — LISINOPRIL 20 MG PO TABS
20.0000 mg | ORAL_TABLET | Freq: Once | ORAL | Status: AC
  Administered 2016-12-19: 20 mg via ORAL
  Filled 2016-12-19 (×2): qty 1

## 2016-12-19 MED ORDER — GABAPENTIN 300 MG PO CAPS
600.0000 mg | ORAL_CAPSULE | Freq: Three times a day (TID) | ORAL | Status: DC
  Administered 2016-12-19 – 2016-12-22 (×9): 600 mg via ORAL
  Filled 2016-12-19 (×11): qty 2

## 2016-12-19 MED ORDER — HYDRALAZINE HCL 10 MG PO TABS
10.0000 mg | ORAL_TABLET | Freq: Once | ORAL | Status: AC
  Administered 2016-12-19: 10 mg via ORAL
  Filled 2016-12-19 (×2): qty 1

## 2016-12-19 MED ORDER — HYDROXYZINE HCL 50 MG PO TABS
50.0000 mg | ORAL_TABLET | Freq: Four times a day (QID) | ORAL | Status: DC | PRN
  Administered 2016-12-19 – 2016-12-22 (×6): 50 mg via ORAL
  Filled 2016-12-19 (×7): qty 1

## 2016-12-19 MED ORDER — LISINOPRIL 20 MG PO TABS
20.0000 mg | ORAL_TABLET | Freq: Every day | ORAL | Status: DC
  Administered 2016-12-20 – 2016-12-22 (×2): 20 mg via ORAL
  Filled 2016-12-19 (×5): qty 1

## 2016-12-19 MED ORDER — SERTRALINE HCL 50 MG PO TABS
50.0000 mg | ORAL_TABLET | Freq: Every day | ORAL | Status: DC
  Administered 2016-12-20 – 2016-12-22 (×3): 50 mg via ORAL
  Filled 2016-12-19 (×4): qty 1

## 2016-12-19 NOTE — Plan of Care (Signed)
Problem: Education: Goal: Utilization of techniques to improve thought processes will improve Outcome: Not Progressing Nurse discussed depression/coping skills with patient.

## 2016-12-19 NOTE — Progress Notes (Signed)
Recreation Therapy Notes  Date: 12/19/16 Time: 0930 Location: 300 Hall Group Room  Group Topic: Stress Management  Goal Area(s) Addresses:  Patient will verbalize importance of using healthy stress management.  Patient will identify positive emotions associated with healthy stress management.   Intervention: Stress Management  Activity :  Meditation.  LRT played a meditation on how to deal with stress from the Calm app.  Patients were to follow along as the meditation played.  Education:  Stress Management, Discharge Planning.   Education Outcome: Acknowledges edcuation/In group clarification offered/Needs additional education  Clinical Observations/Feedback: Pt did not attend group.   [name], LRT/CTRS         [name] A 12/19/2016 1:07 PM

## 2016-12-19 NOTE — Progress Notes (Signed)
[name] Group Notes:  (Nursing/MHT/Case Management/Adjunct)  Date:  12/19/2016  Time:  11:24 PM  Type of Therapy:  Psychoeducational Skills  Participation Level:  Active  Participation Quality:  Attentive  Affect:  Blunted  Cognitive:  Appropriate  Insight:  Good  Engagement in Group:  Engaged  Modes of Intervention:  Education  Summary of Progress/Problems: The patient described his day as having been "all right". He states that he had to work on being more patient today in dealing with the ups and downs of his day. In terms of the theme for the day, his wellness strategy will be to read more books and to exercise.   Johnathan Baker S 12/19/2016, 11:24 PM

## 2016-12-19 NOTE — Progress Notes (Signed)
D.  Pt pleasant on approach, concerned about sleep medication.  He had previously been on Trazodone and didn't do well so was changed to Remeron 15 mg for tonight.  Pt was hopeful this would work, but wished to have a back up plan if it didn't.  Pt denies SI/HI/hallucinations at this time.  Pt did attend evening AA group, minimal but appropriate interaction with peers on unit.  A.  Support and encouragement offered, NP notified of concern about sleep and new orders received.  R.  Pt remains safe on the unit, will continue to monitor.

## 2016-12-19 NOTE — [facility] Group Notes (Signed)
Pt attended spiritual care group on grief and loss facilitated by chaplain Jerene Pitch   Group opened with brief discussion and psycho-social ed around grief and loss in relationships and in relation to self - identifying life patterns, circumstances, changes that cause losses. Established group norm of speaking from own life experience. Group goal of establishing open and affirming space for members to share loss and experience with grief, normalize grief experience and provide psycho social education and grief support.      Johnathan Baker was present throughout group.  He was attentive to other group members.  He asked about "universal norms" like "people say I shouldn't isolate, but sometimes I need to get away from people who are toxic"   Group and facilitator explored being attentive to outcomes and whether something is "working for [place]"  Described some folks "hiding in work" to not address grief, while work can benefit others as it helps them take their minds to a different place.  Group acknowledged that a way of coping may function for [place] for a period, and then become unhelpful.    [place], [place]

## 2016-12-19 NOTE — Progress Notes (Addendum)
Patient ID: Johnathan Baker, male   DOB: 1971/08/30, 46 y.o.   MRN: [id number]  Pt currently presents with a flat affect and cooperative behavior. Pt reports to writer that their goal is to "stay sober." Pt reports good sleep with current medication regimen. Pt trending hypertensive, asymptomatic. Pt interacts positively with peers in the milieu.   Pt provided with medications per providers orders. Pt's labs and vitals were monitored throughout the night. Pt given a 1:1 about emotional and mental status. Pt supported and encouraged to express concerns and questions. Pt educated on medications. Provider informed of patients hypertension, see MAR. Instructions to assess blood pressure again in the morning. Pt educated on high blood pressure risk factors and medications.   Pt's safety ensured with 15 minute and environmental checks. Pt currently denies SI/HI and A/V hallucinations. Pt verbally agrees to seek staff if SI/HI or A/VH occurs and to consult with staff before acting on any harmful thoughts. Will continue POC.

## 2016-12-19 NOTE — Tx Team (Signed)
Interdisciplinary Treatment and Diagnostic Plan Update  12/19/2016 Time of Session: 9:30 AM Jayanth Tyburski MRN: [id number]  Principal Diagnosis: MDD (major depressive disorder), recurrent severe, without psychosis ([place])  Secondary Diagnoses: Principal Problem:   MDD (major depressive disorder), recurrent severe, without psychosis ([place])   Current Medications:  Current Facility-Administered Medications  Medication Dose Route Frequency Provider Last Rate Last Dose  . acetaminophen (TYLENOL) tablet 650 mg  650 mg Oral Q6H PRN Ethelene Hal, NP   650 mg at 12/19/16 0739  . alum & mag hydroxide-simeth (MAALOX/MYLANTA) 200-200-20 MG/5ML suspension 30 mL  30 mL Oral Q4H PRN Ethelene Hal, NP      . gabapentin (NEURONTIN) capsule 300 mg  300 mg Oral TID Ethelene Hal, NP   300 mg at 12/19/16 1217  . hydrOXYzine (ATARAX/VISTARIL) tablet 25 mg  25 mg Oral Q6H PRN Ethelene Hal, NP   25 mg at 12/19/16 0741  . hydrOXYzine (ATARAX/VISTARIL) tablet 50 mg  50 mg Oral Once PRN Rozetta Nunnery, NP      . levothyroxine (SYNTHROID, LEVOTHROID) tablet 100 mcg  100 mcg Oral QAC breakfast Ethelene Hal, NP   100 mcg at 12/19/16 [phone]  . magnesium hydroxide (MILK OF MAGNESIA) suspension 30 mL  30 mL Oral Daily PRN Ethelene Hal, NP      . mirtazapine (REMERON) tablet 15 mg  15 mg Oral QHS Kathlee Nations, MD   15 mg at 12/18/16 2104  . nicotine polacrilex (NICORETTE) gum 2 mg  2 mg Oral PRN Jenne Campus, MD      . QUEtiapine (SEROQUEL) tablet 75 mg  75 mg Oral QHS Ethelene Hal, NP   75 mg at 12/18/16 2104   PTA Medications: Prescriptions Prior to Admission  Medication Sig Dispense Refill Last Dose  . diclofenac sodium (VOLTAREN) 1 % GEL Apply 2 g topically 4 (four) times daily as needed (right knee pain). (Patient not taking: Reported on 12/16/2016)   Not Taking at Unknown time  . gabapentin (NEURONTIN) 300 MG capsule Take 1 capsule (300 mg total) by mouth 3 (three)  times daily. For agitation 90 capsule 0 12/15/2016 at Unknown time  . hydrOXYzine (ATARAX/VISTARIL) 25 MG tablet Take 1 tablet (25 mg total) by mouth every 6 (six) hours as needed for anxiety. (Patient not taking: Reported on 12/16/2016) 60 tablet 0 Not Taking at Unknown time  . levothyroxine (SYNTHROID, LEVOTHROID) 100 MCG tablet Take 1 tablet (100 mcg total) by mouth daily before breakfast. For low functioning Thyroid gland 1 tablet 0 12/15/2016 at Unknown time  . QUEtiapine (SEROQUEL) 25 MG tablet Take 3 tablets (75 mg total) by mouth at bedtime. For mood control 90 tablet 0 12/15/2016 at Unknown time  . traZODone (DESYREL) 100 MG tablet Take 1 tablet (100 mg total) by mouth at bedtime as needed for sleep. (Patient not taking: Reported on 12/16/2016) 30 tablet 0 Not Taking at Unknown time    Patient Stressors: Financial difficulties Legal issue Medication change or noncompliance Substance abuse  Patient Strengths: Average or above average intelligence Capable of independent living General fund of knowledge Motivation for treatment/growth  Treatment Modalities: Medication Management, Group therapy, Case management,  1 to 1 session with clinician, Psychoeducation, Recreational therapy.   Physician Treatment Plan for Primary Diagnosis: MDD (major depressive disorder), recurrent severe, without psychosis ([place]) Long Term Goal(s): Improvement in symptoms so as ready for discharge Improvement in symptoms so as ready for discharge   Short Term Goals: Ability  to disclose and discuss suicidal ideas Ability to demonstrate self-control will improve Ability to identify changes in lifestyle to reduce recurrence of condition will improve Compliance with prescribed medications will improve  Medication Management: Evaluate patient's response, side effects, and tolerance of medication regimen.  Therapeutic Interventions: 1 to 1 sessions, Unit Group sessions and Medication administration.  Evaluation of  Outcomes: Progressing  Physician Treatment Plan for Secondary Diagnosis: Principal Problem:   MDD (major depressive disorder), recurrent severe, without psychosis ([place])  Long Term Goal(s): Improvement in symptoms so as ready for discharge Improvement in symptoms so as ready for discharge   Short Term Goals: Ability to disclose and discuss suicidal ideas Ability to demonstrate self-control will improve Ability to identify changes in lifestyle to reduce recurrence of condition will improve Compliance with prescribed medications will improve     Medication Management: Evaluate patient's response, side effects, and tolerance of medication regimen.  Therapeutic Interventions: 1 to 1 sessions, Unit Group sessions and Medication administration.  Evaluation of Outcomes: Progressing   RN Treatment Plan for Primary Diagnosis: MDD (major depressive disorder), recurrent severe, without psychosis ([place]) Long Term Goal(s): Knowledge of disease and therapeutic regimen to maintain health will improve  Short Term Goals: Ability to demonstrate self-control, Ability to participate in decision making will improve, Ability to identify and develop effective coping behaviors will improve and Compliance with prescribed medications will improve  Medication Management: RN will administer medications as ordered by provider, will assess and evaluate patient's response and provide education to patient for prescribed medication. RN will report any adverse and/or side effects to prescribing provider.  Therapeutic Interventions: 1 on 1 counseling sessions, Psychoeducation, Medication administration, Evaluate responses to treatment, Monitor vital signs and CBGs as ordered, Perform/monitor CIWA, COWS, AIMS and Fall Risk screenings as ordered, Perform wound care treatments as ordered.  Evaluation of Outcomes: Progressing   LCSW Treatment Plan for Primary Diagnosis: MDD (major depressive disorder), recurrent severe,  without psychosis ([place]) Long Term Goal(s): Safe transition to appropriate next level of care at discharge, Engage patient in therapeutic group addressing interpersonal concerns.  Short Term Goals: Engage patient in aftercare planning with referrals and resources, Facilitate patient progression through stages of change regarding substance use diagnoses and concerns, Identify triggers associated with mental health/substance abuse issues and Increase skills for wellness and recovery  Therapeutic Interventions: Assess for all discharge needs, 1 to 1 time with Social worker, Explore available resources and support systems, Assess for adequacy in community support network, Educate family and significant other(s) on suicide prevention, Complete Psychosocial Assessment, Interpersonal group therapy.  Evaluation of Outcomes: Progressing   Progress in Treatment: Attending groups: Yes. Participating in groups: Yes.   Taking medication as prescribed: Yes. Toleration medication: Yes. Family/Significant other contact made: No, will contact:  collateral w patient consent Patient understands diagnosis: Yes. Discussing patient identified problems/goals with staff: Yes. Medical problems stabilized or resolved: Yes. Denies suicidal/homicidal ideation: No. and As evidenced by:  history of suicide attempts, psychosocial stressors/lack of support in community, hopelessness expressed, "doesnt care" anymore Issues/concerns per patient self-inventory: No. Other: na  New problem(s) identified: No, Describe:  none at this time  New Short Term/Long Term Goal(s):  CSW assessing  Discharge Plan or Barriers: CSW assessing for resources  Reason for Continuation of Hospitalization: Depression Medication stabilization Suicidal ideation  Estimated Length of Stay:  3 - 5 days  Attendees: Patient: 12/19/2016 12:50 PM  Physician: Germaine Pomfret MD 12/19/2016 12:50 PM  Nursing: Comer Locket RN and Christa D RN 12/19/2016  12:50  PM  RN Care Manager: Addison Naegeli RN CM 12/19/2016 12:50 PM  Social Worker: Carlean Jews [place] LCSW 12/19/2016 12:50 PM  Recreational Therapist:  12/19/2016 12:50 PM  Other:  12/19/2016 12:50 PM  Other:  12/19/2016 12:50 PM  Other: 12/19/2016 12:50 PM    Scribe for Treatment Team: Beverely Pace, LCSW 12/19/2016 12:50 PM

## 2016-12-19 NOTE — Plan of Care (Signed)
Problem: Activity: Goal: Interest or engagement in activities will improve Outcome: Progressing Pt has attended evening AA groups this evening

## 2016-12-19 NOTE — Progress Notes (Signed)
[facility] MD Progress Note  12/19/2016 11:42 AM Johnathan Baker  MRN:  [id number] Subjective:  Patient participated well with writer. He shares his lifelong struggle with poor frustration tolerance, aggression, fighting verbally/phsically even when he knows its a poor choice.  He shares that he wants to work on this, behave better, stay away from drugs. He thought about killing himself because of all his failures and disappointments.  He feels that prison significantly effected his psyche and outlook on life.  He reports that he doesn't know what to do right, or how to do things "right."  He would be interested in an inpatient rehab care facility.  He feels that he can keep himself safe in hospital. Agreeable to SSRI initiation and titration of seroquel to XR formulation for daytime assistance with mood lability.   Principal Problem: MDD (major depressive disorder), recurrent severe, without psychosis ([place]) Diagnosis:   Patient Active Problem List   Diagnosis Date Noted  . CKD (chronic kidney disease), symptom management only, stage 2 (mild) [N18.2] 11/06/2016  . AKI (acute kidney injury) ([place]) [N17.9] 11/05/2016  . Major depressive disorder, recurrent severe without psychotic features ([place]) [F33.2] 09/23/2016  . MDD (major depressive disorder), recurrent severe, without psychosis ([place]) [F33.2] 09/23/2016  . Cocaine abuse [F14.10] 12/21/2015  . Major depressive disorder, recurrent episode ([place]) [F33.9] 11/08/2015  . Alcohol dependence with uncomplicated withdrawal ([place]) [F10.230]   . Thyroid activity decreased [E03.9]   . Cocaine dependence with cocaine-induced mood disorder ([place]) [F14.24] 10/03/2015  . Alcohol dependence ([place]) [F10.20] 10/03/2015  . Hypothyroidism [E03.9] 10/03/2015   Total Time spent with patient: 20 minutes  Past Psychiatric History: See H&P for full details   Past Medical History:  Past Medical History:  Diagnosis Date  . Back pain   . Depression   . Drug addiction ([place])    . Homelessness   . Hypertension   . Suicidal ideation   . Thyroid disease    History reviewed. No pertinent surgical history. Family History: History reviewed. No pertinent family history. Family Psychiatric  History: See H&P for full details  Social History:  History  Alcohol Use  . Yes    Comment: binge drinks     History  Drug Use  . Types: Cocaine, Marijuana    Social History   Social History  . Marital status: Married    Spouse name: N/A  . Number of children: N/A  . Years of education: N/A   Social History Main Topics  . Smoking status: Current Every Day Smoker    Packs/day: 0.50    Types: Cigarettes  . Smokeless tobacco: Never Used  . Alcohol use Yes     Comment: binge drinks  . Drug use: Yes    Types: Cocaine, Marijuana  . Sexual activity: Yes   Other Topics Concern  . None   Social History Narrative  . None   Additional Social History:                         Sleep: Fair  Appetite:  Fair  Current Medications: Current Facility-Administered Medications  Medication Dose Route Frequency Provider Last Rate Last Dose  . acetaminophen (TYLENOL) tablet 650 mg  650 mg Oral Q6H PRN Ethelene Hal, NP   650 mg at 12/19/16 0739  . alum & mag hydroxide-simeth (MAALOX/MYLANTA) 200-200-20 MG/5ML suspension 30 mL  30 mL Oral Q4H PRN Ethelene Hal, NP      . gabapentin (NEURONTIN) capsule 300  mg  300 mg Oral TID Ethelene Hal, NP   300 mg at 12/19/16 0737  . hydrOXYzine (ATARAX/VISTARIL) tablet 25 mg  25 mg Oral Q6H PRN Ethelene Hal, NP   25 mg at 12/19/16 0741  . hydrOXYzine (ATARAX/VISTARIL) tablet 50 mg  50 mg Oral Once PRN Rozetta Nunnery, NP      . levothyroxine (SYNTHROID, LEVOTHROID) tablet 100 mcg  100 mcg Oral QAC breakfast Ethelene Hal, NP   100 mcg at 12/19/16 [phone]  . magnesium hydroxide (MILK OF MAGNESIA) suspension 30 mL  30 mL Oral Daily PRN Ethelene Hal, NP      . mirtazapine (REMERON) tablet 15  mg  15 mg Oral QHS Kathlee Nations, MD   15 mg at 12/18/16 2104  . nicotine polacrilex (NICORETTE) gum 2 mg  2 mg Oral PRN Jenne Campus, MD      . QUEtiapine (SEROQUEL) tablet 75 mg  75 mg Oral QHS Ethelene Hal, NP   75 mg at 12/18/16 2104    Lab Results: No results found for this or any previous visit (from the past 48 hour(s)).  Blood Alcohol level:  Lab Results  Component Value Date   ETH <5 12/16/2016   ETH <5 0000000    Metabolic Disorder Labs: Lab Results  Component Value Date   HGBA1C 4.8 09/28/2016   MPG 91 09/28/2016   MPG 94 09/27/2016   No results found for: PROLACTIN Lab Results  Component Value Date   CHOL 152 09/27/2016   TRIG 111 09/27/2016   HDL 65 09/27/2016   CHOLHDL 2.3 09/27/2016   VLDL 22 09/27/2016   LDLCALC 65 09/27/2016   LDLCALC 73 02/11/2014    Physical Findings: AIMS: Facial and Oral Movements Muscles of Facial Expression: None, normal Lips and Perioral Area: None, normal Jaw: None, normal Tongue: None, normal,Extremity Movements Upper (arms, wrists, hands, fingers): None, normal Lower (legs, knees, ankles, toes): None, normal, Trunk Movements Neck, shoulders, hips: None, normal, Overall Severity Severity of abnormal movements (highest score from questions above): None, normal Incapacitation due to abnormal movements: None, normal Patient's awareness of abnormal movements (rate only patient's report): No Awareness, Dental Status Current problems with teeth and/or dentures?: No Does patient usually wear dentures?: No  CIWA:  CIWA-Ar Total: 1 COWS:     Musculoskeletal: Strength & Muscle Tone: within normal limits Gait & Station: normal Patient leans: N/A  Psychiatric Specialty Exam: Physical Exam  ROS  Blood pressure (!) 137/91, pulse (!) 57, temperature 98.4 F (36.9 C), temperature source Oral, resp. rate 16, height 6' 5.01" (1.956 m), weight 109.8 kg (242 lb).Body mass index is 28.69 kg/m.  General Appearance:  Casual  Eye Contact:  Good  Speech:  Clear and Coherent  Volume:  Normal  Mood:  Anxious  Affect:  Congruent  Thought Process:  Coherent and Linear  Orientation:  Full (Time, Place, and Person)  Thought Content:  Logical  Suicidal Thoughts:  Yes.  without intent/plan  Homicidal Thoughts:  No  Memory:  Immediate;   Fair Recent;   Fair Remote;   Fair  Judgement:  Intact  Insight:  Present and Shallow  Psychomotor Activity:  Normal  Concentration:  Concentration: Good  Recall:  NA  Fund of Knowledge:  Fair  Language:  Fair  Akathisia:  Negative  Handed:  Right  AIMS (if indicated):     Assets:  Communication Skills Desire for Improvement  ADL's:  Intact  Cognition:  WNL  Sleep:  Number of Hours: 6.5    Treatment Plan Summary: Daily contact with patient to assess and evaluate symptoms and progress in treatment and Medication management  Switch seroquel to 100 mg XR formulation and titrate for mood/affective lability Initiate Zoloft 50 mg daily for mood and poor frustration tolerance Discontinue mirtazapine given lack of need for two sleep aids; sleeps well with seroquel Coordinate for inpatient rehab / residential treatment options Continue to explore goals and areas for improvements in terms of coping strategies and other ways of expressing dissatisfaction in conflicts    Aundra Dubin, MD 12/19/2016, 11:42 AM

## 2016-12-19 NOTE — Progress Notes (Signed)
D:  Patient's self inventory sheet, patient sleeps good, no sleep medication given.  Good appetite, normal energy level.  Rated depression, hopeless and anxiety #5.  Denied withdrawals.  SI, off/on, contracts for safety.  Physical problems, worst pain #5 legs, pain medication helpful.  Goal is to stop thinking about certain people.  Plans to stay busy.  Does have discharge plans.  Has misdemeanor assault charge in March. A:  Medications administered per MD orders.  Emotional support and encouragement given patient. R:  Denied A/V hallucinations.  Spoke to nurse this morning and stated he does have SI and HI thoughts off/on, contracts for safety.  Safety maintained with 15 minute checks.

## 2016-12-19 NOTE — BHH Group Notes (Signed)
[place] LCSW Group Therapy  12/19/2016 1:30 to 2:30 PM  Type of Therapy:  Group Therapy Overcoming Obstacles   Participation Level:  Active  Participation Quality:  Attentive and Sharing  Affect:  Depressed  Cognitive:  Oriented  Insight:  Limited  Engagement in Therapy:  Limited  Modes of Intervention:  Clarification, Discussion, Problem-solving, Rapport Building, Socialization and Support  Summary of Progress/Problems:  Group discussion focused on what patient's see as their own obstacles to recovery. Multiple patients shared frustrations they experience with "the system" and what they see as injustices. Patient shared belief that recovery will be difficult to deal with due to 'my long term patterns of use." Patient feels he may be owed a settlement due to the time he spent in solitary while incarcerated.     Sheilah Pigeon, LCSW

## 2016-12-20 NOTE — BHH Group Notes (Signed)
[place] LCSW Group Therapy  12/20/2016 2:37 PM  Type of Therapy:  Group Therapy  Participation Level:  Did Not Attend  (attended for a short time and left early)   Wynelle Dreier N 12/20/2016, 2:37 PM

## 2016-12-20 NOTE — Progress Notes (Signed)
Recreation Therapy Notes  Animal-Assisted Activity (AAA) Program Checklist/Progress Notes Patient Eligibility Criteria Checklist & Daily Group note for Rec TxIntervention  Date: 02.13.2018 Time: 2:45pm Location: [address]    AAA/T Program Assumption of Risk Form signed by Patient/ or Parent Legal Guardian Yes  Patient is free of allergies or sever asthma Yes  Patient reports no fear of animals Yes  Patient reports no history of cruelty to animals Yes  Patient understands his/her participation is voluntary Yes  Patient washes hands before animal contact Yes  Patient washes hands after animal contact Yes  Behavioral Response: Appropriate   Education:Hand Washing, Appropriate Animal Interaction   Education Outcome: Acknowledges education.   Clinical Observations/Feedback: Patient attended session and interacted appropriately with therapy dog and peers.   Laureen Ochs Koray Soter, LRT/CTRS          Lane Hacker 12/20/2016 2:58 PM

## 2016-12-20 NOTE — Progress Notes (Signed)
D:   Patient's self inventory sheet, patient has poor sleep, no sleep medication.  Good appetite, normal energy level, good concentration.  Rated depression 3, hopeless 4, anxiety 5.  Denied withdrawals.  Denied SI, then checked sometimes.  Physical problems, pain, worst pain #2 right side waist down.  Pain medication is helpful.  Goal "accepting I can no longer be with my wife or my family".  "Plans to "stay in treatment".  Does have discharge plans. A:  Medications administered per MD orders.  Emotional support and encouragement given patient. R:  Denied SI while talking to nurse this morning, contracts for safety.  Stated he does have HI thoughts, but would not tell who the people are, but does not have SI thoughts to any staff at [facility].  Denied A/V hallucinations.  Safety maintained with 15 minute checks. Patient stated his leg pain is better, does not have sharp pain since neurotin increased to 600 mg.

## 2016-12-20 NOTE — Plan of Care (Signed)
Problem: Education: Goal: Knowledge of the prescribed therapeutic regimen will improve Outcome: Progressing Nurse discussed depression/coping skills with patient.        

## 2016-12-20 NOTE — Progress Notes (Signed)
[facility] MD Progress Note  12/20/2016 7:51 PM Johnathan Baker  MRN:  [id number] Subjective:  46 yo AAM with background history of SUD and Mood disorder. Self presented to the ER seeking help. Expressed suicidal thoughts at presentation.  Seen today. He was watching a movie prior to interview. Says he is looking forward to the basket ball game this evening. Patient states that he feels he is getting older and has to change his ways. Says he plans to get into rehab. He has some phone calls to make tomorrow. Says he has a misdemeanor charge and he hopes they would accept him in [place] with that. No thoughts of suicide. No thoughts of violence. No evidence of psychosis. He is not pervasively depressed. He is tolerating his medications well.   Nursing staff notes that he has been appropriate. He has been interacting with peers. No behavioral issues. He has not expressed any suicidal thoughts.   Principal Problem: MDD (major depressive disorder), recurrent severe, without psychosis ([place]) Diagnosis:   Patient Active Problem List   Diagnosis Date Noted  . CKD (chronic kidney disease), symptom management only, stage 2 (mild) [N18.2] 11/06/2016  . AKI (acute kidney injury) ([place]) [N17.9] 11/05/2016  . Major depressive disorder, recurrent severe without psychotic features ([place]) [F33.2] 09/23/2016  . MDD (major depressive disorder), recurrent severe, without psychosis ([place]) [F33.2] 09/23/2016  . Cocaine abuse [F14.10] 12/21/2015  . Major depressive disorder, recurrent episode ([place]) [F33.9] 11/08/2015  . Alcohol dependence with uncomplicated withdrawal ([place]) [F10.230]   . Thyroid activity decreased [E03.9]   . Cocaine dependence with cocaine-induced mood disorder ([place]) [F14.24] 10/03/2015  . Alcohol dependence ([place]) [F10.20] 10/03/2015  . Hypothyroidism [E03.9] 10/03/2015   Total Time spent with patient: 20 minutes  Past Psychiatric History: See H&P  Past Medical History:  Past Medical History:  Diagnosis  Date  . Back pain   . Depression   . Drug addiction ([place])   . Homelessness   . Hypertension   . Suicidal ideation   . Thyroid disease    History reviewed. No pertinent surgical history. Family History: History reviewed. No pertinent family history. Family Psychiatric  History: See H&P Social History:  History  Alcohol Use  . Yes    Comment: binge drinks     History  Drug Use  . Types: Cocaine, Marijuana    Social History   Social History  . Marital status: Married    Spouse name: N/A  . Number of children: N/A  . Years of education: N/A   Social History Main Topics  . Smoking status: Current Every Day Smoker    Packs/day: 0.50    Types: Cigarettes  . Smokeless tobacco: Never Used  . Alcohol use Yes     Comment: binge drinks  . Drug use: Yes    Types: Cocaine, Marijuana  . Sexual activity: Yes   Other Topics Concern  . None   Social History Narrative  . None   Additional Social History:                         Sleep: Good  Appetite:  Good  Current Medications: Current Facility-Administered Medications  Medication Dose Route Frequency Provider Last Rate Last Dose  . acetaminophen (TYLENOL) tablet 650 mg  650 mg Oral Q6H PRN Ethelene Hal, NP   650 mg at 12/19/16 0739  . alum & mag hydroxide-simeth (MAALOX/MYLANTA) 200-200-20 MG/5ML suspension 30 mL  30 mL Oral Q4H PRN Billey Chang  Romilda Garret, NP      . gabapentin (NEURONTIN) capsule 600 mg  600 mg Oral TID Aundra Dubin, MD   600 mg at 12/20/16 1633  . hydrOXYzine (ATARAX/VISTARIL) tablet 50 mg  50 mg Oral Q6H PRN Aundra Dubin, MD   50 mg at 12/20/16 1830  . levothyroxine (SYNTHROID, LEVOTHROID) tablet 100 mcg  100 mcg Oral QAC breakfast Ethelene Hal, NP   100 mcg at 12/20/16 [id number]  . lisinopril (PRINIVIL,ZESTRIL) tablet 20 mg  20 mg Oral Daily Laverle Hobby, PA-C   20 mg at 12/20/16 0756  . magnesium hydroxide (MILK OF MAGNESIA) suspension 30 mL  30 mL Oral Daily PRN  Ethelene Hal, NP      . nicotine polacrilex (NICORETTE) gum 2 mg  2 mg Oral PRN Jenne Campus, MD      . QUEtiapine (SEROQUEL XR) 24 hr tablet 100 mg  100 mg Oral QHS Aundra Dubin, MD   100 mg at 12/19/16 2149  . sertraline (ZOLOFT) tablet 50 mg  50 mg Oral Daily Aundra Dubin, MD   50 mg at 12/20/16 [id number]    Lab Results: No results found for this or any previous visit (from the past 48 hour(s)).  Blood Alcohol level:  Lab Results  Component Value Date   ETH <5 12/16/2016   ETH <5 0000000    Metabolic Disorder Labs: Lab Results  Component Value Date   HGBA1C 4.8 09/28/2016   MPG 91 09/28/2016   MPG 94 09/27/2016   No results found for: PROLACTIN Lab Results  Component Value Date   CHOL 152 09/27/2016   TRIG 111 09/27/2016   HDL 65 09/27/2016   CHOLHDL 2.3 09/27/2016   VLDL 22 09/27/2016   LDLCALC 65 09/27/2016   LDLCALC 73 02/11/2014    Physical Findings: AIMS: Facial and Oral Movements Muscles of Facial Expression: None, normal Lips and Perioral Area: None, normal Jaw: None, normal Tongue: None, normal,Extremity Movements Upper (arms, wrists, hands, fingers): None, normal Lower (legs, knees, ankles, toes): None, normal, Trunk Movements Neck, shoulders, hips: None, normal, Overall Severity Severity of abnormal movements (highest score from questions above): None, normal Incapacitation due to abnormal movements: None, normal Patient's awareness of abnormal movements (rate only patient's report): No Awareness, Dental Status Current problems with teeth and/or dentures?: No Does patient usually wear dentures?: No  CIWA:  CIWA-Ar Total: 1 COWS:  COWS Total Score: 1  Musculoskeletal: Strength & Muscle Tone: within normal limits Gait & Station: normal Patient leans: N/A  Psychiatric Specialty Exam: Physical Exam  Constitutional: He appears well-developed and well-nourished.  HENT:  Head: Normocephalic and atraumatic.  Eyes:  Conjunctivae and EOM are normal.  Neck: Normal range of motion. Neck supple.  Respiratory: Effort normal and breath sounds normal.  GI: Soft. Bowel sounds are normal.  Musculoskeletal: Normal range of motion.  Neurological: He is alert. He has normal reflexes.  Skin: Skin is warm and dry.  Psychiatric:  As above    ROS  Blood pressure (!) 139/95, pulse (!) 56, temperature 97.7 F (36.5 C), temperature source Oral, resp. rate 16, height 6' 5.01" (1.956 m), weight 109.8 kg (242 lb).Body mass index is 28.69 kg/m.  General Appearance: Well built, calm and cooeprative. Good eye contact. Good relatedness. Approrpriate behavior. Not internally stimulated  Eye Contact:  Good  Speech:  Clear and Coherent and Normal Rate  Volume:  Normal  Mood:  Euthymic  Affect:  Appropriate and Full Range  Thought  Process:  Coherent and Goal Directed  Orientation:  Full (Time, Place, and Person)  Thought Content:  No negative rumination. Future oriented. No thougts of violence. No delusional theme.   Suicidal Thoughts:  No  Homicidal Thoughts:  No  Memory:  Immediate;   Good Recent;   Good Remote;   Good  Judgement:  Good  Insight:  Good  Psychomotor Activity:  Normal  Concentration:  Concentration: Good and Attention Span: Good  Recall:  Good  Fund of Knowledge:  Good  Language:  Good  Akathisia:  No  Handed:    AIMS (if indicated):     Assets:  Communication Skills Intimacy  ADL's:  Intact  Cognition:  WNL  Sleep:  Number of Hours: 6.25     Treatment Plan Summary: Patient is motivated to get help. He is is future oriented. We would continue current regimen and assist with aftercare and addiction inpatient rehab.   [place], MD 12/20/2016, 7:51 PM

## 2016-12-20 NOTE — BHH Group Notes (Signed)
[place] Group Notes:  (Nursing/MHT/Case Management/Adjunct)  Date:  12/20/2016  Time:  0900  Type of Therapy:  Nurse Education  Participation Level:  Did Not Attend  Participation Quality:    Affect:    Cognitive:    Insight:    Engagement in Group:    Modes of Intervention:    Summary of Progress/Problems: Patient was invited to attend group however did not attend.  Loletta Specter [facility] 12/20/2016, 0930

## 2016-12-20 NOTE — Progress Notes (Signed)
Patient ID: Johnathan Baker, male   DOB: May 09, 1971, 46 y.o.   MRN: [id number]  Pt currently presents with a brighter  affect and cooperative behavior. Pt reports to writer that his day was "a pretty good day." Pt reports good sleep with current medication regimen. However pt states "I am still having nightmares, they scared me last night, always about the same people."   Pt provided with medications per providers orders. Pt's labs and vitals were monitored throughout the night. Pt given a 1:1 about emotional and mental status. Pt supported and encouraged to express concerns and questions. Pt educated on medications and typical length of time before therapeutic effects occur. Pt encouraged to notify staff tonight if nightmare occurs and to consult with MD.   Pt's safety ensured with 15 minute and environmental checks. Pt currently denies SI/HI and A/V hallucinations. Pt verbally agrees to seek staff if SI/HI or A/VH occurs and to consult with staff before acting on any harmful thoughts. Will continue POC.

## 2016-12-21 NOTE — Progress Notes (Signed)
Patient ID: Johnathan Baker, male   DOB: [birth year], 46 y.o.   MRN: [id number]  DAR: Pt. Denies SI/HI and A/V Hallucinations. He reports sleep is fair, appetite is good, energy level is normal, and concentration is poor. He rates depression 4/10, hopelessness 3/10, and anxiety 3/10. Scheduled medications administered to patient per physician's orders. PRN Vistaril administered to patient, see MAR. Support and encouragement provided to the patient to come to staff with questions or concerns. Patient verbalized understanding. Scheduled medications administered to patient per physician's orders. Patient is minimal but cooperative. He is seen in the milieu and is attending some groups. Q15 minute checks are maintained for safety.

## 2016-12-21 NOTE — Progress Notes (Signed)
Recreation Therapy Notes  Date: 12/21/16 Time: 0930 Location: [address] Day[address]  Group Topic: Stress Management  Goal Area(s) Addresses:  Patient will verbalize importance of using healthy stress management.  Patient will identify positive emotions associated with healthy stress management.   Behavioral Response: Engaged  Intervention: Stress Management  Activity :  Resilience Meditation.  LRT introduced the stress management technique of meditation.  LRT play the meditation from the Calm app to allow patients to engage in the activity.  Patients were to follow along as the meditation was played to fully engage in the technique.  Education:  Stress Management, Discharge Planning.   Education Outcome: Acknowledges edcuation/In group clarification offered/Needs additional education  Clinical Observations/Feedback: Pt attended group.   [name], LRT/CTRS         [name] A 12/21/2016 12:16 PM

## 2016-12-21 NOTE — BHH Group Notes (Signed)
[place] LCSW Group Therapy  12/21/2016 12:02 PM  Type of Therapy:  Group Therapy  Participation Level:  Minimal  Participation Quality:  Attentive  Affect:  Anxious  Cognitive:  Alert and Appropriate  Insight:  Limited  Engagement in Therapy:  Lacking  Modes of Intervention:  Discussion and Exploration  Summary of Progress/Problems: The purpose of this group is to assist patients in learning to regulate negative emotions and experience positive emotions. Patients will be guided to discuss ways in which they have been vulnerable to their negative emotions. These vulnerabilities will be juxtaposed with experiences of positive emotions or situations, and patients challenged to use positive emotions to combat negative ones. Special emphasis will be placed on coping with negative emotions in conflict situations, and patients will process healthy conflict resolution skills.  Therapeutic Goals: 1. Patient will identify two positive emotions or experiences to reflect on in order to balance out negative emotions:  2. Patient will label two or more emotions that they find the most difficult to experience:  Patient will be able to demonstrate positive conflict resolution skills through discussion or role plays:   Javontez was engaged and listened to others. He needing prompting to participate and did so, but with very limited information.  He did process how his childhood affected how he has learned to regulate his emotions. He did show interest and some engagement in group.  Lilly [place] 12/21/2016, 12:02 PM

## 2016-12-21 NOTE — Progress Notes (Signed)
[facility] MD Progress Note  12/21/2016 4:46 PM Zam Ent  MRN:  [id number] Subjective:  46 yo AAM with background history of SUD and Mood disorder. Self presented to the ER seeking help. Expressed suicidal thoughts at presentation.   Nursing staff reports that he has been appropriate on the unit. No behavioral issues. He has been maintaining normal biological and ego functions. He is not voicing any thoughts of suicide. He is future oriented.   At interview, patient tells me that he has been feeling good. His mood has been upbeat. He is excited today because he has been accepted into a residential program. No thoughts of suicide. No  thoughts of violence. He talked about the game last night. Says he enjoyed it.  Principal Problem: MDD (major depressive disorder), recurrent severe, without psychosis ([place]) Diagnosis:   Patient Active Problem List   Diagnosis Date Noted  . CKD (chronic kidney disease), symptom management only, stage 2 (mild) [N18.2] 11/06/2016  . AKI (acute kidney injury) ([place]) [N17.9] 11/05/2016  . Major depressive disorder, recurrent severe without psychotic features ([place]) [F33.2] 09/23/2016  . MDD (major depressive disorder), recurrent severe, without psychosis ([place]) [F33.2] 09/23/2016  . Cocaine abuse [F14.10] 12/21/2015  . Major depressive disorder, recurrent episode ([place]) [F33.9] 11/08/2015  . Alcohol dependence with uncomplicated withdrawal ([place]) [F10.230]   . Thyroid activity decreased [E03.9]   . Cocaine dependence with cocaine-induced mood disorder ([place]) [F14.24] 10/03/2015  . Alcohol dependence ([place]) [F10.20] 10/03/2015  . Hypothyroidism [E03.9] 10/03/2015   Total Time spent with patient: 20 minutes  Past Psychiatric History: See H&P  Past Medical History:  Past Medical History:  Diagnosis Date  . Back pain   . Depression   . Drug addiction ([place])   . Homelessness   . Hypertension   . Suicidal ideation   . Thyroid disease    History reviewed. No pertinent  surgical history. Family History: History reviewed. No pertinent family history. Family Psychiatric  History: See H&P Social History:  History  Alcohol Use  . Yes    Comment: binge drinks     History  Drug Use  . Types: Cocaine, Marijuana    Social History   Social History  . Marital status: Married    Spouse name: N/A  . Number of children: N/A  . Years of education: N/A   Social History Main Topics  . Smoking status: Current Every Day Smoker    Packs/day: 0.50    Types: Cigarettes  . Smokeless tobacco: Never Used  . Alcohol use Yes     Comment: binge drinks  . Drug use: Yes    Types: Cocaine, Marijuana  . Sexual activity: Yes   Other Topics Concern  . None   Social History Narrative  . None   Additional Social History:       Sleep: Good  Appetite:  Good  Current Medications: Current Facility-Administered Medications  Medication Dose Route Frequency Provider Last Rate Last Dose  . acetaminophen (TYLENOL) tablet 650 mg  650 mg Oral Q6H PRN Ethelene Hal, NP   650 mg at 12/19/16 0739  . alum & mag hydroxide-simeth (MAALOX/MYLANTA) 200-200-20 MG/5ML suspension 30 mL  30 mL Oral Q4H PRN Ethelene Hal, NP      . gabapentin (NEURONTIN) capsule 600 mg  600 mg Oral TID Aundra Dubin, MD   600 mg at 12/21/16 1206  . hydrOXYzine (ATARAX/VISTARIL) tablet 50 mg  50 mg Oral Q6H PRN Aundra Dubin, MD   50 mg  at 12/21/16 1207  . levothyroxine (SYNTHROID, LEVOTHROID) tablet 100 mcg  100 mcg Oral QAC breakfast Ethelene Hal, NP   100 mcg at 12/21/16 [id number]  . lisinopril (PRINIVIL,ZESTRIL) tablet 20 mg  20 mg Oral Daily Laverle Hobby, PA-C   20 mg at 12/20/16 0756  . magnesium hydroxide (MILK OF MAGNESIA) suspension 30 mL  30 mL Oral Daily PRN Ethelene Hal, NP      . nicotine polacrilex (NICORETTE) gum 2 mg  2 mg Oral PRN Jenne Campus, MD      . QUEtiapine (SEROQUEL XR) 24 hr tablet 100 mg  100 mg Oral QHS Aundra Dubin, MD    100 mg at 12/20/16 2138  . sertraline (ZOLOFT) tablet 50 mg  50 mg Oral Daily Aundra Dubin, MD   50 mg at 12/21/16 [id number]    Lab Results: No results found for this or any previous visit (from the past 48 hour(s)).  Blood Alcohol level:  Lab Results  Component Value Date   ETH <5 12/16/2016   ETH <5 0000000    Metabolic Disorder Labs: Lab Results  Component Value Date   HGBA1C 4.8 09/28/2016   MPG 91 09/28/2016   MPG 94 09/27/2016   No results found for: PROLACTIN Lab Results  Component Value Date   CHOL 152 09/27/2016   TRIG 111 09/27/2016   HDL 65 09/27/2016   CHOLHDL 2.3 09/27/2016   VLDL 22 09/27/2016   LDLCALC 65 09/27/2016   LDLCALC 73 02/11/2014    Physical Findings: AIMS: Facial and Oral Movements Muscles of Facial Expression: None, normal Lips and Perioral Area: None, normal Jaw: None, normal Tongue: None, normal,Extremity Movements Upper (arms, wrists, hands, fingers): None, normal Lower (legs, knees, ankles, toes): None, normal, Trunk Movements Neck, shoulders, hips: None, normal, Overall Severity Severity of abnormal movements (highest score from questions above): None, normal Incapacitation due to abnormal movements: None, normal Patient's awareness of abnormal movements (rate only patient's report): No Awareness, Dental Status Current problems with teeth and/or dentures?: No Does patient usually wear dentures?: No  CIWA:  CIWA-Ar Total: 1 COWS:  COWS Total Score: 1  Musculoskeletal: Strength & Muscle Tone: within normal limits Gait & Station: normal Patient leans: N/A  Psychiatric Specialty Exam: Physical Exam  Constitutional: He appears well-developed and well-nourished.  HENT:  Head: Normocephalic and atraumatic.  Eyes: Conjunctivae and EOM are normal.  Neck: Normal range of motion. Neck supple.  Respiratory: Effort normal and breath sounds normal.  GI: Soft. Bowel sounds are normal.  Musculoskeletal: Normal range of motion.   Neurological: He is alert. He has normal reflexes.  Skin: Skin is warm and dry.  Psychiatric:  As above    ROS  Blood pressure 134/75, pulse (!) 59, temperature 98.4 F (36.9 C), temperature source Oral, resp. rate 16, height 6' 5.01" (1.956 m), weight 109.8 kg (242 lb).Body mass index is 28.69 kg/m.  General Appearance: Well built, calm and cooeprative. Good eye contact. Good relatedness. Approrpriate behavior. Not internally stimulated  Eye Contact:  Good  Speech:  Clear and Coherent and Normal Rate  Volume:  Normal  Mood:  Euthymic  Affect:  Appropriate and Full Range  Thought Process:  Coherent and Goal Directed  Orientation:  Full (Time, Place, and Person)  Thought Content:  No negative rumination. Future oriented. No thougts of violence. No delusional theme.   Suicidal Thoughts:  No  Homicidal Thoughts:  No  Memory:  Immediate;   Good Recent;  Good Remote;   Good  Judgement:  Good  Insight:  Good  Psychomotor Activity:  Normal  Concentration:  Concentration: Good and Attention Span: Good  Recall:  Good  Fund of Knowledge:  Good  Language:  Good  Akathisia:  No  Handed:    AIMS (if indicated):     Assets:  Communication Skills Intimacy  ADL's:  Intact  Cognition:  WNL  Sleep:  Number of Hours: 6.75     Treatment Plan Summary: Patient is motivated to get help. He is is future oriented. For discharge tomorrow.  Artist Beach, MD 12/21/2016, 4:46 PMPatient ID: [name], male   DOB: Feb 28, 1971, 46 y.o.   MRN: [id number]

## 2016-12-21 NOTE — Progress Notes (Signed)
Pt attended the evening NA speaker meeting. Pt was engaged and appropriate. Clint Bolder, NT 12/21/16 10:41 PM

## 2016-12-22 MED ORDER — HYDROXYZINE HCL 25 MG PO TABS
25.0000 mg | ORAL_TABLET | Freq: Four times a day (QID) | ORAL | Status: DC | PRN
  Filled 2016-12-22: qty 10

## 2016-12-22 MED ORDER — LEVOTHYROXINE SODIUM 100 MCG PO TABS: 100.0000 ug | ORAL_TABLET | Freq: Every day | ORAL | 0 refills | Status: DC

## 2016-12-22 MED ORDER — SERTRALINE HCL 50 MG PO TABS: 50.0000 mg | ORAL_TABLET | Freq: Every day | ORAL | 0 refills | Status: DC

## 2016-12-22 MED ORDER — QUETIAPINE FUMARATE 100 MG PO TABS
100.0000 mg | ORAL_TABLET | Freq: Every day | ORAL | Status: DC
  Filled 2016-12-22 (×2): qty 1

## 2016-12-22 MED ORDER — NICOTINE POLACRILEX 2 MG MT GUM: 2.0000 mg | CHEWING_GUM | OROMUCOSAL | 0 refills | Status: DC | PRN

## 2016-12-22 MED ORDER — QUETIAPINE FUMARATE ER 50 MG PO TB24: 100.0000 mg | ORAL_TABLET | Freq: Every day | ORAL | 0 refills | Status: DC

## 2016-12-22 MED ORDER — LISINOPRIL 20 MG PO TABS: 20.0000 mg | ORAL_TABLET | Freq: Every day | ORAL | 0 refills | Status: DC

## 2016-12-22 MED ORDER — HYDROXYZINE HCL 25 MG PO TABS: 25.0000 mg | ORAL_TABLET | Freq: Four times a day (QID) | ORAL | 0 refills | Status: DC | PRN

## 2016-12-22 MED ORDER — QUETIAPINE FUMARATE 100 MG PO TABS: 100.0000 mg | ORAL_TABLET | Freq: Every day | ORAL | 0 refills | Status: DC

## 2016-12-22 MED ORDER — GABAPENTIN 300 MG PO CAPS: 600.0000 mg | ORAL_CAPSULE | Freq: Three times a day (TID) | ORAL | 0 refills | Status: DC

## 2016-12-22 NOTE — BHH Suicide Risk Assessment (Signed)
[facility] Discharge Suicide Risk Assessment   Principal Problem: MDD (major depressive disorder), recurrent severe, without psychosis ([place]) Discharge Diagnoses: Alcohol Use Disorder Patient Active Problem List   Diagnosis Date Noted  . CKD (chronic kidney disease), symptom management only, stage 2 (mild) [N18.2] 11/06/2016  . AKI (acute kidney injury) ([place]) [N17.9] 11/05/2016  . Major depressive disorder, recurrent severe without psychotic features ([place]) [F33.2] 09/23/2016  . MDD (major depressive disorder), recurrent severe, without psychosis ([place]) [F33.2] 09/23/2016  . Cocaine abuse [F14.10] 12/21/2015  . Major depressive disorder, recurrent episode ([place]) [F33.9] 11/08/2015  . Alcohol dependence with uncomplicated withdrawal ([place]) [F10.230]   . Thyroid activity decreased [E03.9]   . Cocaine dependence with cocaine-induced mood disorder ([place]) [F14.24] 10/03/2015  . Alcohol dependence ([place]) [F10.20] 10/03/2015  . Hypothyroidism [E03.9] 10/03/2015    Total Time spent with patient: 30 minutes  Musculoskeletal: Strength & Muscle Tone: within normal limits Gait & Station: normal Patient leans: N/A  Psychiatric Specialty Exam: Review of Systems  Constitutional: Negative.   HENT: Negative.   Eyes: Negative.   Respiratory: Negative.   Cardiovascular: Negative.   Gastrointestinal: Negative.   Genitourinary: Negative.   Musculoskeletal: Negative.   Skin: Negative.   Neurological: Negative.   Endo/Heme/Allergies: Negative.   Psychiatric/Behavioral: Negative for depression, hallucinations, memory loss and suicidal ideas. The patient is not nervous/anxious and does not have insomnia.     Blood pressure 134/75, pulse (!) 59, temperature 98.4 F (36.9 C), temperature source Oral, resp. rate 16, height 6' 5.01" (1.956 m), weight 109.8 kg (242 lb).Body mass index is 28.69 kg/m.  General Appearance: Calm and cooperative. Good relatedness. Appropriate behavior. Not internally stimulated.   Eye  Contact::  Good  Speech:  Spontaneous, normal rate tone and volume.   Volume:  Normal  Mood:  Euthymic  Affect:  Appropriate and Full Range  Thought Process:  Linear and logical.  Orientation:  Full (Time, Place, and Person)  Thought Content:  No delusional theme. No thoughts of violence. No negative rumination. Optimistic about the future. No hallucination in any modality.   Suicidal Thoughts:  No  Homicidal Thoughts:  No  Memory:  Immediate;   Good Recent;   Good Remote;   Good  Judgement:  Good  Insight:  Good  Psychomotor Activity:  Normal  Concentration:  Good  Recall:  Good  Fund of Knowledge:Good  Language: Good  Akathisia:  No  Handed:    AIMS (if indicated):     Assets:  Communication Skills Desire for Improvement Housing Intimacy Resilience Social Support  Sleep:  Number of Hours: 4.75  Cognition: WNL  ADL's:  Intact   Clinical Assessment::   46 yo AAM with background history of SUD and Mood disorder. Self presented to the ER seeking help. Expressed suicidal thoughts at presentation. Patient reports some legal issues. Feels that doing a program would help him get his life back together. He has family support and wants to be there for them. He denied any intent to harm self or others on numerous occassions.  At interview today, he is feeling good. He is not feeling depressed. He is coping well with the anxiety of a new environment that he would be going to. He is pleased with his motivation to seek help. States that he plans to do the entire program. No thoughts of suicide. No thoughts of violence. No evidence of psychosis. No evidence of depression. No evidence of mania. No overwhelming anxiety. He is sleeping well at night. He is tolerating  his medications well. Nursing staff reports that he has been appropriate on the unit. No behavioral issues. Patient has not voiced any thoughts of violence. He has not been observed to be internally stimulated. He has been  appropriate on the unit. Treatment team agrees that he is at his baseline. Treatment team agrees to discharge today.   Demographic Factors:  Male  Loss Factors: NA  Historical Factors: Impulsivity  Risk Reduction Factors:   Responsible for children under 44 years of age, Sense of responsibility to family, Living with another person, especially a relative, Positive social support, Positive therapeutic relationship and Positive coping skills or problem solving skills  Continued Clinical Symptoms:  As above  Cognitive Features That Contribute To Risk:  None    Suicide Risk:  Minimal: No identifiable suicidal ideation. Patient is future oriented. He understands the dis-inhibiting effect of alcohol and psychoactive substances. At this point he is not at risk of suicide.   Follow-up Information    IRC: [facility].   Why:  Follow up with medical provider at [facility] as you have been doing for your medications.  Contact information: [facility] [address] [place], [place] [address] [phone]          Plan Of Care/Follow-up recommendations:  1. Continue current psychotropic medications. 2. Mental health and addiction follow up treatment as recommended.   Artist Beach, MD 12/22/2016, 9:12 AM

## 2016-12-22 NOTE — Progress Notes (Signed)
  [facility] Adult Case Management Discharge Plan :  Will you be returning to the same living situation after discharge:  No. At discharge, do you have transportation home?: Yes,  [place] Do you have the ability to pay for your medications: Yes,  going to the [facility]  Release of information consent forms completed and in the chart;  Patient's signature needed at discharge.  Patient to Follow up at: Follow-up Information    IRC: [facility] Follow up.   Why:  Follow up with medical provider at [facility] as you have been doing for your medications.  Contact information: [facility] [address] [place], [place] [address] [phone]          Next level of care provider has access to [place] and Suicide Prevention discussed: Yes,  with patient  Have you used any form of tobacco in the last 30 days? (Cigarettes, Smokeless Tobacco, Cigars, and/or Pipes): Yes  Has patient been referred to the Quitline?: N/A patient is not a smoker  Patient has been referred for addiction treatment: Yes  Lilly C[place] 12/22/2016, 10:39 AM
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
## 2016-12-22 NOTE — Tx Team (Signed)
Interdisciplinary Treatment and Diagnostic Plan Update  12/22/2016 Time of Session: 9:30 AM Aquarius Latouche MRN: [id number]  Principal Diagnosis: MDD (major depressive disorder), recurrent severe, without psychosis ([place])  Secondary Diagnoses: Principal Problem:   MDD (major depressive disorder), recurrent severe, without psychosis ([place])   Current Medications:  Current Facility-Administered Medications  Medication Dose Route Frequency Provider Last Rate Last Dose  . acetaminophen (TYLENOL) tablet 650 mg  650 mg Oral Q6H PRN Ethelene Hal, NP   650 mg at 12/19/16 0739  . alum & mag hydroxide-simeth (MAALOX/MYLANTA) 200-200-20 MG/5ML suspension 30 mL  30 mL Oral Q4H PRN Ethelene Hal, NP      . gabapentin (NEURONTIN) capsule 600 mg  600 mg Oral TID Aundra Dubin, MD   600 mg at 12/22/16 0804  . hydrOXYzine (ATARAX/VISTARIL) tablet 50 mg  50 mg Oral Q6H PRN Aundra Dubin, MD   50 mg at 12/21/16 1949  . levothyroxine (SYNTHROID, LEVOTHROID) tablet 100 mcg  100 mcg Oral QAC breakfast Ethelene Hal, NP   100 mcg at 12/22/16 7846  . lisinopril (PRINIVIL,ZESTRIL) tablet 20 mg  20 mg Oral Daily Laverle Hobby, PA-C   20 mg at 12/22/16 0804  . magnesium hydroxide (MILK OF MAGNESIA) suspension 30 mL  30 mL Oral Daily PRN Ethelene Hal, NP      . nicotine polacrilex (NICORETTE) gum 2 mg  2 mg Oral PRN Jenne Campus, MD      . QUEtiapine (SEROQUEL XR) 24 hr tablet 100 mg  100 mg Oral QHS Aundra Dubin, MD   100 mg at 12/21/16 2159  . sertraline (ZOLOFT) tablet 50 mg  50 mg Oral Daily Aundra Dubin, MD   50 mg at 12/22/16 9629   PTA Medications: Prescriptions Prior to Admission  Medication Sig Dispense Refill Last Dose  . diclofenac sodium (VOLTAREN) 1 % GEL Apply 2 g topically 4 (four) times daily as needed (right knee pain). (Patient not taking: Reported on 12/16/2016)   Not Taking at Unknown time  . gabapentin (NEURONTIN) 300 MG capsule Take 1  capsule (300 mg total) by mouth 3 (three) times daily. For agitation 90 capsule 0 12/15/2016 at Unknown time  . hydrOXYzine (ATARAX/VISTARIL) 25 MG tablet Take 1 tablet (25 mg total) by mouth every 6 (six) hours as needed for anxiety. (Patient not taking: Reported on 12/16/2016) 60 tablet 0 Not Taking at Unknown time  . levothyroxine (SYNTHROID, LEVOTHROID) 100 MCG tablet Take 1 tablet (100 mcg total) by mouth daily before breakfast. For low functioning Thyroid gland 1 tablet 0 12/15/2016 at Unknown time  . QUEtiapine (SEROQUEL) 25 MG tablet Take 3 tablets (75 mg total) by mouth at bedtime. For mood control 90 tablet 0 12/15/2016 at Unknown time  . traZODone (DESYREL) 100 MG tablet Take 1 tablet (100 mg total) by mouth at bedtime as needed for sleep. (Patient not taking: Reported on 12/16/2016) 30 tablet 0 Not Taking at Unknown time    Patient Stressors: Financial difficulties Legal issue Medication change or noncompliance Substance abuse  Patient Strengths: Average or above average intelligence Capable of independent living General fund of knowledge Motivation for treatment/growth  Treatment Modalities: Medication Management, Group therapy, Case management,  1 to 1 session with clinician, Psychoeducation, Recreational therapy.   Physician Treatment Plan for Primary Diagnosis: MDD (major depressive disorder), recurrent severe, without psychosis ([place]) Long Term Goal(s): Improvement in symptoms so as ready for discharge Improvement in symptoms so as ready for discharge  Short Term Goals: Ability to disclose and discuss suicidal ideas Ability to demonstrate self-control will improve Ability to identify changes in lifestyle to reduce recurrence of condition will improve Compliance with prescribed medications will improve  Medication Management: Evaluate patient's response, side effects, and tolerance of medication regimen.  Therapeutic Interventions: 1 to 1 sessions, Unit Group sessions and  Medication administration.  Evaluation of Outcomes: Met  Physician Treatment Plan for Secondary Diagnosis: Principal Problem:   MDD (major depressive disorder), recurrent severe, without psychosis ([place])  Long Term Goal(s): Improvement in symptoms so as ready for discharge Improvement in symptoms so as ready for discharge   Short Term Goals: Ability to disclose and discuss suicidal ideas Ability to demonstrate self-control will improve Ability to identify changes in lifestyle to reduce recurrence of condition will improve Compliance with prescribed medications will improve     Medication Management: Evaluate patient's response, side effects, and tolerance of medication regimen.  Therapeutic Interventions: 1 to 1 sessions, Unit Group sessions and Medication administration.  Evaluation of Outcomes: Met   RN Treatment Plan for Primary Diagnosis: MDD (major depressive disorder), recurrent severe, without psychosis ([place]) Long Term Goal(s): Knowledge of disease and therapeutic regimen to maintain health will improve  Short Term Goals: Ability to demonstrate self-control, Ability to participate in decision making will improve, Ability to identify and develop effective coping behaviors will improve and Compliance with prescribed medications will improve  Medication Management: RN will administer medications as ordered by provider, will assess and evaluate patient's response and provide education to patient for prescribed medication. RN will report any adverse and/or side effects to prescribing provider.  Therapeutic Interventions: 1 on 1 counseling sessions, Psychoeducation, Medication administration, Evaluate responses to treatment, Monitor vital signs and CBGs as ordered, Perform/monitor CIWA, COWS, AIMS and Fall Risk screenings as ordered, Perform wound care treatments as ordered.  Evaluation of Outcomes: Met   LCSW Treatment Plan for Primary Diagnosis: MDD (major depressive disorder),  recurrent severe, without psychosis ([place]) Long Term Goal(s): Safe transition to appropriate next level of care at discharge, Engage patient in therapeutic group addressing interpersonal concerns.  Short Term Goals: Engage patient in aftercare planning with referrals and resources, Facilitate patient progression through stages of change regarding substance use diagnoses and concerns, Identify triggers associated with mental health/substance abuse issues and Increase skills for wellness and recovery  Therapeutic Interventions: Assess for all discharge needs, 1 to 1 time with Social worker, Explore available resources and support systems, Assess for adequacy in community support network, Educate family and significant other(s) on suicide prevention, Complete Psychosocial Assessment, Interpersonal group therapy.  Evaluation of Outcomes: Met   Progress in Treatment: Attending groups: Yes. Participating in groups: Yes.   Taking medication as prescribed: Yes. Toleration medication: Yes. Family/Significant other contact made: No, will contact:  collateral w patient consent Patient understands diagnosis: Yes. Discussing patient identified problems/goals with staff: Yes. Medical problems stabilized or resolved: Yes. Denies suicidal/homicidal ideation: Yes. Issues/concerns per patient self-inventory: No. Other: na  New problem(s) identified: No, Describe:  none at this time  New Short Term/Long Term Goal(s):  CSW assessing  Discharge Plan or Barriers: Patient will go to [facility] and continue to follow up [facility] for his medication  Reason for Continuation of Hospitalization: none, goals met  Estimated Length of Stay:  3 - 5 days  Attendees: Patient: 12/22/2016 10:33 AM  Physician: Germaine Pomfret MD 12/22/2016 10:33 AM  Nursing: Santiago Glad RN 12/22/2016 10:33 AM  RN Care Manager: Addison Naegeli RN CM 12/22/2016 10:33  AM  Social Worker:Kimorah Ridolfi  LCSW 12/22/2016 10:33 AM  Recreational Therapist:  12/22/2016 10:33  AM  Other:  12/22/2016 10:33 AM  Other:  12/22/2016 10:33 AM  Other: 12/22/2016 10:33 AM    Scribe for Treatment Team: Lilly Cove, LCSW 12/22/2016 10:33 AM

## 2016-12-22 NOTE — Progress Notes (Signed)
D: Pt has been calm and cooperative this shift. He denied SI, HI, and AVH. He reported sleeping OK. On his self inventory, he rated his anxiety 5, hopelessness 3, and depression 3. He is working on Educational psychologist for discharge to [facility] [employer] today. He took medications without issues. He reported good appetite, normal energy level, and good concentration level.  A: Meds given as ordered. Q15 safety checks maintained. Support/encouragement offered.  R: Pt remains free from harm and continues with treatment. Will continue to monitor for needs/safety.

## 2016-12-22 NOTE — Progress Notes (Signed)
At the beginning of the shift, pt was in the dayroom watching TV.  Writer approached pt to introduce self and he stated he was feeling anxious.  He requested Vistaril which was given.  Pt states he is supposed to discharge tomorrow to [facility].  He feels this is the source of his anxiety.  He denies SI/HI/AVH.  He denies having any withdrawal symptoms at this time.  He voices no other needs or concerns.  Writer spent some time talking about [employer] and the good things that they do for the community.  Pt was in agreement.  Support and encouragement offered.  Discharge plans are in process.  Safety maintained with q15 minute checks.

## 2016-12-22 NOTE — Progress Notes (Signed)
Patient was discharged per order. AVS, scripts, SRA, and transition summary were all reviewed with patient. Pt was given an opportunity to ask questions and verbalized understanding of all discharge paperwork. Belongings were returned, and patient signed for receipt. Patient verbalized readiness for discharge and appeared in no acute distress when escorted to lobby.

## 2017-01-06 NOTE — Congregational Nurse Program (Signed)
Congregational Nurse Program Note  Date of Encounter: 12/26/2016  Past Medical History: Past Medical History:  Diagnosis Date  . Back pain   . Depression   . Drug addiction ([place])   . Homelessness   . Hypertension   . Suicidal ideation   . Thyroid disease     Encounter Details:     CNP Questionnaire - 12/26/16 1137      Patient Demographics   Is this a new or existing patient? New   Patient is considered a/an Not Applicable   Race African-American/Black     Patient Assistance   Location of Patient Assistance Not Applicable   Patient's financial/insurance status Low Income;Self-Pay (Uninsured)   Uninsured Patient ([place] Oncologist) Yes   Interventions Not Applicable   Patient referred to apply for the following financial assistance [place] insecurities addressed Not Research scientist (physical sciences) No   Assistance securing medications No   Educational health offerings Behavioral health;Navigating the healthcare system     Encounter Details   Primary purpose of visit Chronic Illness/Condition Visit   Was an Emergency Department visit averted? Not Applicable   Does patient have a medical provider? Yes   Patient referred to Not Applicable   Was a mental health screening completed? (GAINS tool) No   Does patient have dental issues? No   Does patient have vision issues? No   Does your patient have an abnormal blood pressure today? No   Since previous encounter, have you referred patient for abnormal blood pressure that resulted in a new diagnosis or medication change? No   Does your patient have an abnormal blood glucose today? No   Since previous encounter, have you referred patient for abnormal blood glucose that resulted in a new diagnosis or medication change? No   Was there a life-saving intervention made? No     Client just wanted to explore what services were provided by me.  Gave him the information requested.  States he is  currently at [facility] and doing very well.  Encouraged to come back to the clinic to "check in" or to be supported

## 2017-01-26 ENCOUNTER — Ambulatory Visit (INDEPENDENT_AMBULATORY_CARE_PROVIDER_SITE_OTHER): Payer: BLUE CROSS/BLUE SHIELD | Admitting: Emergency Medicine

## 2017-01-26 VITALS — BP 140/90 | HR 68 | Temp 98.1°F | Resp 18 | Ht 77.01 in | Wt 256.2 lb

## 2017-01-26 DIAGNOSIS — E079 Disorder of thyroid, unspecified: Secondary | ICD-10-CM | POA: Diagnosis not present

## 2017-01-26 DIAGNOSIS — I10 Essential (primary) hypertension: Secondary | ICD-10-CM | POA: Diagnosis not present

## 2017-01-26 MED ORDER — LISINOPRIL 20 MG PO TABS: 20.0000 mg | ORAL_TABLET | Freq: Every day | ORAL | 3 refills | Status: DC

## 2017-01-26 MED ORDER — LEVOTHYROXINE SODIUM 100 MCG PO TABS: 100.0000 ug | ORAL_TABLET | Freq: Every day | ORAL | 3 refills | Status: DC

## 2017-01-26 NOTE — Progress Notes (Signed)
Subjective:    Johnathan Baker is a 46 y.o. male who presents for evaluation of elevated blood pressures. Age at onset of elevated blood pressure:  unknown. Cardiac symptoms: none. Patient denies: chest pain, chest pressure/discomfort, dyspnea, exertional chest pressure/discomfort, fatigue, irregular heart beat, lower extremity edema, palpitations, paroxysmal nocturnal dyspnea, syncope and tachypnea. Cardiovascular risk factors: hypertension. Use of agents associated with hypertension: none. History of target organ damage: kidneys The following portions of the patient's history were reviewed and updated as appropriate: allergies, current medications, past family history, past medical history, past social history, past surgical history and problem list.  Review of Systems Pertinent items are noted in HPI. Pertinent items noted in HPI and remainder of comprehensive ROS otherwise negative.   Objective:    BP 140/90 (BP Location: Right Arm, Cuff Size: Large)   Pulse 68   Temp 98.1 F (36.7 C) (Oral)   Resp 18   Ht 6' 5.01" (1.956 m)   Wt 256 lb 3.2 oz (116.2 kg)   SpO2 98%   BMI 30.37 kg/m   General Appearance:    Alert, cooperative, no distress, appears stated age  Head:    Normocephalic, without obvious abnormality, atraumatic  Eyes:    PERRL, conjunctiva/corneas clear, EOM's intact, fundi    benign, both eyes       Ears:    Normal TM's and external ear canals, both ears  Nose:   Nares normal, septum midline, mucosa normal, no drainage    or sinus tenderness  Throat:   Lips, mucosa, and tongue normal; teeth and gums normal  Neck:   Supple, symmetrical, trachea midline, no adenopathy;       thyroid:  No enlargement/tenderness/nodules; no carotid   bruit or JVD  Back:     Symmetric, no curvature, ROM normal, no CVA tenderness  Lungs:     Clear to auscultation bilaterally, respirations unlabored  Chest wall:    No tenderness or deformity  Heart:    Regular rate and rhythm, S1 and S2 normal,  no murmur, rub   or gallop  Abdomen:     Soft, non-tender, bowel sounds active all four quadrants,    no masses, no organomegaly        Extremities:   Extremities normal, atraumatic, no cyanosis or edema  Pulses:   2+ and symmetric all extremities  Skin:   Skin color, texture, turgor normal, no rashes or lesions  Lymph nodes:   Cervical, supraclavicular, and axillary nodes normal  Neurologic:   CNII-XII intact. Normal strength, sensation and reflexes      throughout    Cardiographics ECG:   Assessment:   Johnathan Baker was seen today for medication refill and elevated bp at triage.  Diagnoses and all orders for this visit:  Essential hypertension -     Discontinue: lisinopril (PRINIVIL,ZESTRIL) 20 MG tablet; Take 1 tablet (20 mg total) by mouth daily. For high blood pressure -     lisinopril (PRINIVIL,ZESTRIL) 20 MG tablet; Take 1 tablet (20 mg total) by mouth daily. For high blood pressure  Thyroid disease -     Discontinue: levothyroxine (SYNTHROID, LEVOTHROID) 100 MCG tablet; Take 1 tablet (100 mcg total) by mouth daily before breakfast. For low functioning Thyroid gland -     levothyroxine (SYNTHROID, LEVOTHROID) 100 MCG tablet; Take 1 tablet (100 mcg total) by mouth daily before breakfast. For low functioning Thyroid gland     Plan:    Patient Instructions       IF you received  an x-ray today, you will receive an invoice from [facility]. Please contact [facility] at [phone] with questions or concerns regarding your invoice.   IF you received labwork today, you will receive an invoice from [facility]. Please contact [facility] at [phone] with questions or concerns regarding your invoice.   Our billing staff will not be able to assist you with questions regarding bills from these companies.  You will be contacted with the lab results as soon as they are available. The fastest way to get your results is to activate your My Chart account. Instructions  are located on the last page of this paperwork. If you have not heard from [place] regarding the results in 2 weeks, please contact this office.         Hypertension Hypertension is another name for high blood pressure. High blood pressure forces your heart to work harder to pump blood. This can cause problems over time. There are two numbers in a blood pressure reading. There is a top number (systolic) over a bottom number (diastolic). It is best to have a blood pressure below 120/80. Healthy choices can help lower your blood pressure. You may need medicine to help lower your blood pressure if:  Your blood pressure cannot be lowered with healthy choices.  Your blood pressure is higher than 130/80. Follow these instructions at home: Eating and drinking   If directed, follow the DASH eating plan. This diet includes:  Filling half of your plate at each meal with fruits and vegetables.  Filling one quarter of your plate at each meal with whole grains. Whole grains include whole wheat pasta, brown rice, and whole grain bread.  Eating or drinking low-fat dairy products, such as skim milk or low-fat yogurt.  Filling one quarter of your plate at each meal with low-fat (lean) proteins. Low-fat proteins include fish, skinless chicken, eggs, beans, and tofu.  Avoiding fatty meat, cured and processed meat, or chicken with skin.  Avoiding premade or processed food.  Eat less than 1,500 mg of salt (sodium) a day.  Limit alcohol use to no more than 1 drink a day for nonpregnant women and 2 drinks a day for men. One drink equals 12 oz of beer, 5 oz of wine, or 1 oz of hard liquor. Lifestyle   Work with your doctor to stay at a healthy weight or to lose weight. Ask your doctor what the best weight is for you.  Get at least 30 minutes of exercise that causes your heart to beat faster (aerobic exercise) most days of the week. This may include walking, swimming, or biking.  Get at least 30 minutes  of exercise that strengthens your muscles (resistance exercise) at least 3 days a week. This may include lifting weights or pilates.  Do not use any products that contain nicotine or tobacco. This includes cigarettes and e-cigarettes. If you need help quitting, ask your doctor.  Check your blood pressure at home as told by your doctor.  Keep all follow-up visits as told by your doctor. This is important. Medicines   Take over-the-counter and prescription medicines only as told by your doctor. Follow directions carefully.  Do not skip doses of blood pressure medicine. The medicine does not work as well if you skip doses. Skipping doses also puts you at risk for problems.  Ask your doctor about side effects or reactions to medicines that you should watch for. Contact a doctor if:  You think you are having a reaction to  the medicine you are taking.  You have headaches that keep coming back (recurring).  You feel dizzy.  You have swelling in your ankles.  You have trouble with your vision. Get help right away if:  You get a very bad headache.  You start to feel confused.  You feel weak or numb.  You feel faint.  You get very bad pain in your:  Chest.  Belly (abdomen).  You throw up (vomit) more than once.  You have trouble breathing. Summary  Hypertension is another name for high blood pressure.  Making healthy choices can help lower blood pressure. If your blood pressure cannot be controlled with healthy choices, you may need to take medicine. This information is not intended to replace advice given to you by your health care provider. Make sure you discuss any questions you have with your health care provider. Document Released: 04/11/2008 Document Revised: 09/21/2016 Document Reviewed: 09/21/2016 Elsevier Interactive Patient Education  2017 [place].  Managing Your Hypertension Hypertension is commonly called high blood pressure. This is when the force of  your blood pressing against the walls of your arteries is too strong. Arteries are blood vessels that carry blood from your heart throughout your body. Hypertension forces the heart to work harder to pump blood, and may cause the arteries to become narrow or stiff. Having untreated or uncontrolled hypertension can cause heart attack, stroke, kidney disease, and other problems. What are blood pressure readings? A blood pressure reading consists of a higher number over a lower number. Ideally, your blood pressure should be below 120/80. The first ("top") number is called the systolic pressure. It is a measure of the pressure in your arteries as your heart beats. The second ("bottom") number is called the diastolic pressure. It is a measure of the pressure in your arteries as the heart relaxes. What does my blood pressure reading mean? Blood pressure is classified into four stages. Based on your blood pressure reading, your health care provider may use the following stages to determine what type of treatment you need, if any. Systolic pressure and diastolic pressure are measured in a unit called mm Hg. Normal   Systolic pressure: below 431.  Diastolic pressure: below 80. Elevated   Systolic pressure: 540-086.  Diastolic pressure: below 80. Hypertension stage 1     Diastolic pressure: 76-19. Hypertension stage 2   Systolic pressure: 509 or above.  Diastolic pressure: 90 or above. What health risks are associated with hypertension? Managing your hypertension is an important responsibility. Uncontrolled hypertension can lead to:  A heart attack.  A stroke.  A weakened blood vessel (aneurysm).  Heart failure.  Kidney damage.  Eye damage.  Metabolic syndrome.  Memory and concentration problems. What changes can I make to manage my hypertension? Eating and drinking   Eat a diet that is high in fiber and potassium, and low in salt (sodium), added sugar, and fat. An example eating  plan is called the DASH (Dietary Approaches to Stop Hypertension) diet. To eat this way:  Eat plenty of fresh fruits and vegetables. Try to fill half of your plate at each meal with fruits and vegetables.  Eat whole grains, such as whole wheat pasta, brown rice, or whole grain bread. Fill about one quarter of your plate with whole grains.  Eat low-fat diary products.  Avoid fatty cuts of meat, processed or cured meats, and poultry with skin. Fill about one quarter of your plate with lean proteins such as fish, chicken without  skin, beans, eggs, and tofu.  Avoid premade and processed foods. These tend to be higher in sodium, added sugar, and fat.     Lifestyle   Work with your health care provider to maintain a healthy body weight, or to lose weight. Ask what an ideal weight is for you.  Get at least 30 minutes of exercise that causes your heart to beat faster (aerobic exercise) most days of the week. Activities may include walking, swimming, or biking.       Monitoring   Monitor your blood pressure at home as told by your health care provider. Your personal target blood pressure may vary depending on your medical conditions, your age, and other factors.  Have your blood pressure checked regularly, as often as told by your health care provider. Working with your health care provider   Review all the medicines you take with your health care provider because there may be side effects or interactions.  Talk with your health care provider about your diet, exercise habits, and other lifestyle factors that may be contributing to hypertension.  Visit your health care provider regularly. Your health care provider can help you create and adjust your plan for managing hypertension. Will I need medicine to control my blood pressure? Your health care provider may prescribe medicine if lifestyle changes are not enough to get your blood pressure under control, and if:  Your systolic blood  pressure is 130 or higher.  Your diastolic blood pressure is 80 or higher. Take medicines only as told by your health care provider. Follow the directions carefully. Blood pressure medicines must be taken as prescribed. The medicine does not work as well when you skip doses. Skipping doses also puts you at risk for problems. Contact a health care provider if:  You think you are having a reaction to medicines you have taken.  You have repeated (recurrent) headaches.  You feel dizzy.  You have swelling in your ankles.  You have trouble with your vision. Get help right away if:  You develop a severe headache or confusion.  You have unusual weakness or numbness, or you feel faint.  You have severe pain in your chest or abdomen.  You vomit repeatedly.  You have trouble breathing. Summary  Hypertension is when the force of blood pumping through your arteries is too strong. If this condition is not controlled, it may put you at risk for serious complications.  Your personal target blood pressure may vary depending on your medical conditions, your age, and other factors. For most people, a normal blood pressure is less than 120/80.  Hypertension is managed by lifestyle changes, medicines, or both. Lifestyle changes include weight loss, eating a healthy, low-sodium diet, exercising more, and limiting alcohol. This information is not intended to replace advice given to you by your health care provider. Make sure you discuss any questions you have with your health care provider. Document Released: 07/18/2012 Document Revised: 09/21/2016 Document Reviewed: 09/21/2016 Elsevier Interactive Patient Education  2017 [place].   Low-Sodium Eating Plan Sodium, which is an element that makes up salt, helps you maintain a healthy balance of fluids in your body. Too much sodium can increase your blood pressure and cause fluid and waste to be held in your body. Your health care provider or  dietitian may recommend following this plan if you have high blood pressure (hypertension), kidney disease, liver disease, or heart failure. Eating less sodium can help lower your blood pressure, reduce swelling, and protect  your heart, liver, and kidneys. What are tips for following this plan? General guidelines   Most people on this plan should limit their sodium intake to 1,500-2,000 mg (milligrams) of sodium each day. Reading food labels   The Nutrition Facts label lists the amount of sodium in one serving of the food. If you eat more than one serving, you must multiply the listed amount of sodium by the number of servings.  Choose foods with less than 140 mg of sodium per serving.  Avoid foods with 300 mg of sodium or more per serving. Shopping   Look for lower-sodium products, often labeled as "low-sodium" or "no salt added."  Always check the sodium content even if foods are labeled as "unsalted" or "no salt added".  Buy fresh foods.  Avoid canned foods and premade or frozen meals.  Avoid canned, cured, or processed meats  Buy breads that have less than 80 mg of sodium per slice. Cooking   Eat more home-cooked food and less restaurant, buffet, and fast food.  Avoid adding salt when cooking. Use salt-free seasonings or herbs instead of table salt or sea salt. Check with your health care provider or pharmacist before using salt substitutes.  Cook with plant-based oils, such as canola, sunflower, or olive oil. Meal planning   When eating at a restaurant, ask that your food be prepared with less salt or no salt, if possible.  Avoid foods that contain MSG (monosodium glutamate). MSG is sometimes added to [place] food, bouillon, and some canned foods. What foods are recommended? The items listed may not be a complete list. Talk with your dietitian about what dietary choices are best for you. Grains  Low-sodium cereals, including oats, puffed wheat and rice, and shredded  wheat. Low-sodium crackers. Unsalted rice. Unsalted pasta. Low-sodium bread. Whole-grain breads and whole-grain pasta. Vegetables  Fresh or frozen vegetables. "No salt added" canned vegetables. "No salt added" tomato sauce and paste. Low-sodium or reduced-sodium tomato and vegetable juice. Fruits  Fresh, frozen, or canned fruit. Fruit juice. Meats and other protein foods  Fresh or frozen (no salt added) meat, poultry, seafood, and fish. Low-sodium canned tuna and salmon. Unsalted nuts. Dried peas, beans, and lentils without added salt. Unsalted canned beans. Eggs. Unsalted nut butters. Dairy  Milk. Soy milk. Cheese that is naturally low in sodium, such as ricotta cheese, fresh mozzarella, or Swiss cheese Low-sodium or reduced-sodium cheese. Cream cheese. Yogurt. Fats and oils  Unsalted butter. Unsalted margarine with no trans fat. Vegetable oils such as canola or olive oils. Seasonings and other foods  Fresh and dried herbs and spices. Salt-free seasonings. Low-sodium mustard and ketchup. Sodium-free salad dressing. Sodium-free light mayonnaise. Fresh or refrigerated horseradish. Lemon juice. Vinegar. Homemade, reduced-sodium, or low-sodium soups. Unsalted popcorn and pretzels. Low-salt or salt-free chips. What foods are not recommended? The items listed may not be a complete list. Talk with your dietitian about what dietary choices are best for you. Grains  Instant hot cereals. Bread stuffing, pancake, and biscuit mixes. Croutons. Seasoned rice or pasta mixes. Noodle soup cups. Boxed or frozen macaroni and cheese. Regular salted crackers. Self-rising flour. Vegetables  Sauerkraut, pickled vegetables, and relishes. Olives. [place] fries. Onion rings. Regular canned vegetables (not low-sodium or reduced-sodium). Regular canned tomato sauce and paste (not low-sodium or reduced-sodium). Regular tomato and vegetable juice (not low-sodium or reduced-sodium). Frozen vegetables in sauces. Meats and other  protein foods  Meat or fish that is salted, canned, smoked, spiced, or pickled. Bacon, ham, sausage, hotdogs, corned  beef, chipped beef, packaged lunch meats, salt pork, jerky, pickled herring, anchovies, regular canned tuna, sardines, salted nuts. Dairy  Processed cheese and cheese spreads. Cheese curds. Blue cheese. Feta cheese. String cheese. Regular cottage cheese. Buttermilk. Canned milk. Fats and oils  Salted butter. Regular margarine. Ghee. Bacon fat. Seasonings and other foods  Onion salt, garlic salt, seasoned salt, table salt, and sea salt. Canned and packaged gravies. Worcestershire sauce. Tartar sauce. Barbecue sauce. Teriyaki sauce. Soy sauce, including reduced-sodium. Steak sauce. Fish sauce. Oyster sauce. Cocktail sauce. Horseradish that you find on the shelf. Regular ketchup and mustard. Meat flavorings and tenderizers. Bouillon cubes. Hot sauce and Tabasco sauce. Premade or packaged marinades. Premade or packaged taco seasonings. Relishes. Regular salad dressings. Salsa. Potato and tortilla chips. Corn chips and puffs. Salted popcorn and pretzels. Canned or dried soups. Pizza. Frozen entrees and pot pies. Summary  Eating less sodium can help lower your blood pressure, reduce swelling, and protect your heart, liver, and kidneys.  Most people on this plan should limit their sodium intake to 1,500-2,000 mg (milligrams) of sodium each day.  Canned, boxed, and frozen foods are high in sodium. Restaurant foods, fast foods, and pizza are also very high in sodium. You also get sodium by adding salt to food.  Try to cook at home, eat more fresh fruits and vegetables, and eat less fast food, canned, processed, or prepared foods. This information is not intended to replace advice given to you by your health care provider. Make sure you discuss any questions you have with your health care provider. Document Released: 04/15/2002 Document Revised: 10/17/2016 Document Reviewed:  10/17/2016 Elsevier Interactive Patient Education  2017 [employer]     Medication: continue Lisinopril.

## 2017-01-26 NOTE — Patient Instructions (Addendum)
IF you received an x-ray today, you will receive an invoice from [facility]. Please contact [facility] at [phone] with questions or concerns regarding your invoice.   IF you received labwork today, you will receive an invoice from [place]. Please contact [facility] at [phone] with questions or concerns regarding your invoice.   Our billing staff will not be able to assist you with questions regarding bills from these companies.  You will be contacted with the lab results as soon as they are available. The fastest way to get your results is to activate your My Chart account. Instructions are located on the last page of this paperwork. If you have not heard from [place] regarding the results in 2 weeks, please contact this office.         Hypertension Hypertension is another name for high blood pressure. High blood pressure forces your heart to work harder to pump blood. This can cause problems over time. There are two numbers in a blood pressure reading. There is a top number (systolic) over a bottom number (diastolic). It is best to have a blood pressure below 120/80. Healthy choices can help lower your blood pressure. You may need medicine to help lower your blood pressure if:  Your blood pressure cannot be lowered with healthy choices.  Your blood pressure is higher than 130/80. Follow these instructions at home: Eating and drinking   If directed, follow the DASH eating plan. This diet includes:  Filling half of your plate at each meal with fruits and vegetables.  Filling one quarter of your plate at each meal with whole grains. Whole grains include whole wheat pasta, brown rice, and whole grain bread.  Eating or drinking low-fat dairy products, such as skim milk or low-fat yogurt.  Filling one quarter of your plate at each meal with low-fat (lean) proteins. Low-fat proteins include fish, skinless chicken, eggs, beans, and tofu.  Avoiding fatty  meat, cured and processed meat, or chicken with skin.  Avoiding premade or processed food.  Eat less than 1,500 mg of salt (sodium) a day.  Limit alcohol use to no more than 1 drink a day for nonpregnant women and 2 drinks a day for men. One drink equals 12 oz of beer, 5 oz of wine, or 1 oz of hard liquor. Lifestyle   Work with your doctor to stay at a healthy weight or to lose weight. Ask your doctor what the best weight is for you.  Get at least 30 minutes of exercise that causes your heart to beat faster (aerobic exercise) most days of the week. This may include walking, swimming, or biking.  Get at least 30 minutes of exercise that strengthens your muscles (resistance exercise) at least 3 days a week. This may include lifting weights or pilates.  Do not use any products that contain nicotine or tobacco. This includes cigarettes and e-cigarettes. If you need help quitting, ask your doctor.  Check your blood pressure at home as told by your doctor.  Keep all follow-up visits as told by your doctor. This is important. Medicines   Take over-the-counter and prescription medicines only as told by your doctor. Follow directions carefully.  Do not skip doses of blood pressure medicine. The medicine does not work as well if you skip doses. Skipping doses also puts you at risk for problems.  Ask your doctor about side effects or reactions to medicines that you should watch for. Contact a doctor if:  You think you  are having a reaction to the medicine you are taking.  You have headaches that keep coming back (recurring).  You feel dizzy.  You have swelling in your ankles.  You have trouble with your vision. Get help right away if:  You get a very bad headache.  You start to feel confused.  You feel weak or numb.  You feel faint.  You get very bad pain in your:  Chest.  Belly (abdomen).  You throw up (vomit) more than once.  You have trouble  breathing. Summary  Hypertension is another name for high blood pressure.  Making healthy choices can help lower blood pressure. If your blood pressure cannot be controlled with healthy choices, you may need to take medicine. This information is not intended to replace advice given to you by your health care provider. Make sure you discuss any questions you have with your health care provider. Document Released: 04/11/2008 Document Revised: 09/21/2016 Document Reviewed: 09/21/2016 Elsevier Interactive Patient Education  2017 [place].  Managing Your Hypertension Hypertension is commonly called high blood pressure. This is when the force of your blood pressing against the walls of your arteries is too strong. Arteries are blood vessels that carry blood from your heart throughout your body. Hypertension forces the heart to work harder to pump blood, and may cause the arteries to become narrow or stiff. Having untreated or uncontrolled hypertension can cause heart attack, stroke, kidney disease, and other problems. What are blood pressure readings? A blood pressure reading consists of a higher number over a lower number. Ideally, your blood pressure should be below 120/80. The first ("top") number is called the systolic pressure. It is a measure of the pressure in your arteries as your heart beats. The second ("bottom") number is called the diastolic pressure. It is a measure of the pressure in your arteries as the heart relaxes. What does my blood pressure reading mean? Blood pressure is classified into four stages. Based on your blood pressure reading, your health care provider may use the following stages to determine what type of treatment you need, if any. Systolic pressure and diastolic pressure are measured in a unit called mm Hg. Normal   Systolic pressure: below 235.  Diastolic pressure: below 80. Elevated   Systolic pressure: 573-220.  Diastolic pressure: below 80. Hypertension  stage 1     Diastolic pressure: 25-42. Hypertension stage 2   Systolic pressure: 706 or above.  Diastolic pressure: 90 or above. What health risks are associated with hypertension? Managing your hypertension is an important responsibility. Uncontrolled hypertension can lead to:  A heart attack.  A stroke.  A weakened blood vessel (aneurysm).  Heart failure.  Kidney damage.  Eye damage.  Metabolic syndrome.  Memory and concentration problems. What changes can I make to manage my hypertension? Eating and drinking   Eat a diet that is high in fiber and potassium, and low in salt (sodium), added sugar, and fat. An example eating plan is called the DASH (Dietary Approaches to Stop Hypertension) diet. To eat this way:  Eat plenty of fresh fruits and vegetables. Try to fill half of your plate at each meal with fruits and vegetables.  Eat whole grains, such as whole wheat pasta, brown rice, or whole grain bread. Fill about one quarter of your plate with whole grains.  Eat low-fat diary products.  Avoid fatty cuts of meat, processed or cured meats, and poultry with skin. Fill about one quarter of your plate with lean proteins  such as fish, chicken without skin, beans, eggs, and tofu.  Avoid premade and processed foods. These tend to be higher in sodium, added sugar, and fat.     Lifestyle   Work with your health care provider to maintain a healthy body weight, or to lose weight. Ask what an ideal weight is for you.  Get at least 30 minutes of exercise that causes your heart to beat faster (aerobic exercise) most days of the week. Activities may include walking, swimming, or biking.       Monitoring   Monitor your blood pressure at home as told by your health care provider. Your personal target blood pressure may vary depending on your medical conditions, your age, and other factors.  Have your blood pressure checked regularly, as often as told by your health care  provider. Working with your health care provider   Review all the medicines you take with your health care provider because there may be side effects or interactions.  Talk with your health care provider about your diet, exercise habits, and other lifestyle factors that may be contributing to hypertension.  Visit your health care provider regularly. Your health care provider can help you create and adjust your plan for managing hypertension. Will I need medicine to control my blood pressure? Your health care provider may prescribe medicine if lifestyle changes are not enough to get your blood pressure under control, and if:  Your systolic blood pressure is 130 or higher.  Your diastolic blood pressure is 80 or higher. Take medicines only as told by your health care provider. Follow the directions carefully. Blood pressure medicines must be taken as prescribed. The medicine does not work as well when you skip doses. Skipping doses also puts you at risk for problems. Contact a health care provider if:  You think you are having a reaction to medicines you have taken.  You have repeated (recurrent) headaches.  You feel dizzy.  You have swelling in your ankles.  You have trouble with your vision. Get help right away if:  You develop a severe headache or confusion.  You have unusual weakness or numbness, or you feel faint.  You have severe pain in your chest or abdomen.  You vomit repeatedly.  You have trouble breathing. Summary  Hypertension is when the force of blood pumping through your arteries is too strong. If this condition is not controlled, it may put you at risk for serious complications.  Your personal target blood pressure may vary depending on your medical conditions, your age, and other factors. For most people, a normal blood pressure is less than 120/80.  Hypertension is managed by lifestyle changes, medicines, or both. Lifestyle changes include weight loss,  eating a healthy, low-sodium diet, exercising more, and limiting alcohol. This information is not intended to replace advice given to you by your health care provider. Make sure you discuss any questions you have with your health care provider. Document Released: 07/18/2012 Document Revised: 09/21/2016 Document Reviewed: 09/21/2016 Elsevier Interactive Patient Education  2017 [place].   Low-Sodium Eating Plan Sodium, which is an element that makes up salt, helps you maintain a healthy balance of fluids in your body. Too much sodium can increase your blood pressure and cause fluid and waste to be held in your body. Your health care provider or dietitian may recommend following this plan if you have high blood pressure (hypertension), kidney disease, liver disease, or heart failure. Eating less sodium can help lower your blood  pressure, reduce swelling, and protect your heart, liver, and kidneys. What are tips for following this plan? General guidelines   Most people on this plan should limit their sodium intake to 1,500-2,000 mg (milligrams) of sodium each day. Reading food labels   The Nutrition Facts label lists the amount of sodium in one serving of the food. If you eat more than one serving, you must multiply the listed amount of sodium by the number of servings.  Choose foods with less than 140 mg of sodium per serving.  Avoid foods with 300 mg of sodium or more per serving. Shopping   Look for lower-sodium products, often labeled as "low-sodium" or "no salt added."  Always check the sodium content even if foods are labeled as "unsalted" or "no salt added".  Buy fresh foods.  Avoid canned foods and premade or frozen meals.  Avoid canned, cured, or processed meats  Buy breads that have less than 80 mg of sodium per slice. Cooking   Eat more home-cooked food and less restaurant, buffet, and fast food.  Avoid adding salt when cooking. Use salt-free seasonings or herbs  instead of table salt or sea salt. Check with your health care provider or pharmacist before using salt substitutes.  Cook with plant-based oils, such as canola, sunflower, or olive oil. Meal planning   When eating at a restaurant, ask that your food be prepared with less salt or no salt, if possible.  Avoid foods that contain MSG (monosodium glutamate). MSG is sometimes added to [place] food, bouillon, and some canned foods. What foods are recommended? The items listed may not be a complete list. Talk with your dietitian about what dietary choices are best for you. Grains  Low-sodium cereals, including oats, puffed wheat and rice, and shredded wheat. Low-sodium crackers. Unsalted rice. Unsalted pasta. Low-sodium bread. Whole-grain breads and whole-grain pasta. Vegetables  Fresh or frozen vegetables. "No salt added" canned vegetables. "No salt added" tomato sauce and paste. Low-sodium or reduced-sodium tomato and vegetable juice. Fruits  Fresh, frozen, or canned fruit. Fruit juice. Meats and other protein foods  Fresh or frozen (no salt added) meat, poultry, seafood, and fish. Low-sodium canned tuna and salmon. Unsalted nuts. Dried peas, beans, and lentils without added salt. Unsalted canned beans. Eggs. Unsalted nut butters. Dairy  Milk. Soy milk. Cheese that is naturally low in sodium, such as ricotta cheese, fresh mozzarella, or Swiss cheese Low-sodium or reduced-sodium cheese. Cream cheese. Yogurt. Fats and oils  Unsalted butter. Unsalted margarine with no trans fat. Vegetable oils such as canola or olive oils. Seasonings and other foods  Fresh and dried herbs and spices. Salt-free seasonings. Low-sodium mustard and ketchup. Sodium-free salad dressing. Sodium-free light mayonnaise. Fresh or refrigerated horseradish. Lemon juice. Vinegar. Homemade, reduced-sodium, or low-sodium soups. Unsalted popcorn and pretzels. Low-salt or salt-free chips. What foods are not recommended? The items  listed may not be a complete list. Talk with your dietitian about what dietary choices are best for you. Grains  Instant hot cereals. Bread stuffing, pancake, and biscuit mixes. Croutons. Seasoned rice or pasta mixes. Noodle soup cups. Boxed or frozen macaroni and cheese. Regular salted crackers. Self-rising flour. Vegetables  Sauerkraut, pickled vegetables, and relishes. Olives. [place] fries. Onion rings. Regular canned vegetables (not low-sodium or reduced-sodium). Regular canned tomato sauce and paste (not low-sodium or reduced-sodium). Regular tomato and vegetable juice (not low-sodium or reduced-sodium). Frozen vegetables in sauces. Meats and other protein foods  Meat or fish that is salted, canned, smoked, spiced, or pickled.  Bacon, ham, sausage, hotdogs, corned beef, chipped beef, packaged lunch meats, salt pork, jerky, pickled herring, anchovies, regular canned tuna, sardines, salted nuts. Dairy  Processed cheese and cheese spreads. Cheese curds. Blue cheese. Feta cheese. String cheese. Regular cottage cheese. Buttermilk. Canned milk. Fats and oils  Salted butter. Regular margarine. Ghee. Bacon fat. Seasonings and other foods  Onion salt, garlic salt, seasoned salt, table salt, and sea salt. Canned and packaged gravies. Worcestershire sauce. Tartar sauce. Barbecue sauce. Teriyaki sauce. Soy sauce, including reduced-sodium. Steak sauce. Fish sauce. Oyster sauce. Cocktail sauce. Horseradish that you find on the shelf. Regular ketchup and mustard. Meat flavorings and tenderizers. Bouillon cubes. Hot sauce and Tabasco sauce. Premade or packaged marinades. Premade or packaged taco seasonings. Relishes. Regular salad dressings. Salsa. Potato and tortilla chips. Corn chips and puffs. Salted popcorn and pretzels. Canned or dried soups. Pizza. Frozen entrees and pot pies. Summary  Eating less sodium can help lower your blood pressure, reduce swelling, and protect your heart, liver, and kidneys.  Most  people on this plan should limit their sodium intake to 1,500-2,000 mg (milligrams) of sodium each day.  Canned, boxed, and frozen foods are high in sodium. Restaurant foods, fast foods, and pizza are also very high in sodium. You also get sodium by adding salt to food.  Try to cook at home, eat more fresh fruits and vegetables, and eat less fast food, canned, processed, or prepared foods. This information is not intended to replace advice given to you by your health care provider. Make sure you discuss any questions you have with your health care provider. Document Released: 04/15/2002 Document Revised: 10/17/2016 Document Reviewed: 10/17/2016 Elsevier Interactive Patient Education  2017 [employer].

## 2018-01-01 IMAGING — CR DG CHEST 2V
2 series · 2 of 2 positions shown · non-contrast
Comparison: 03/08/2013

CLINICAL DATA: Hypotension and acute kidney injury.

EXAM:
CHEST  2 VIEW

[w chest pa]
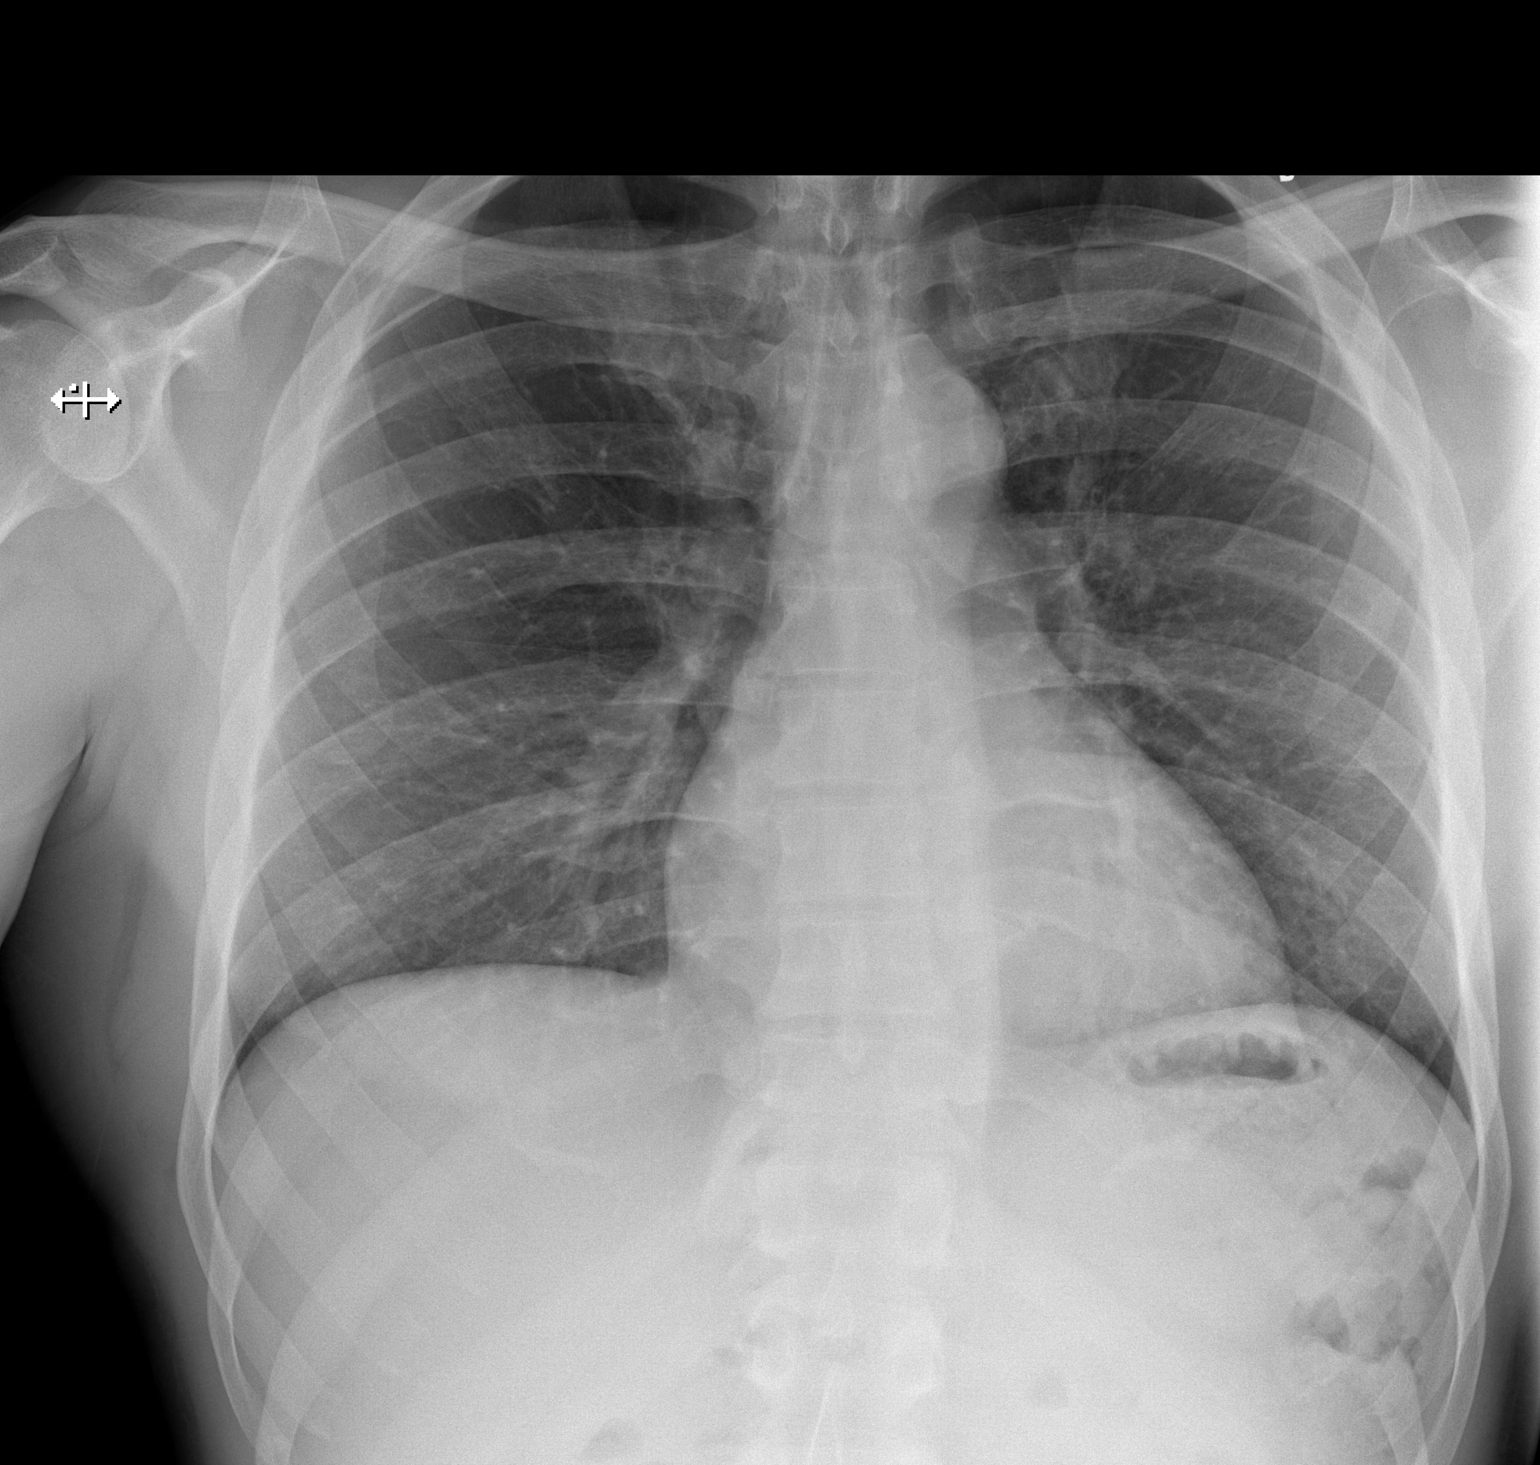

[w chest lat]
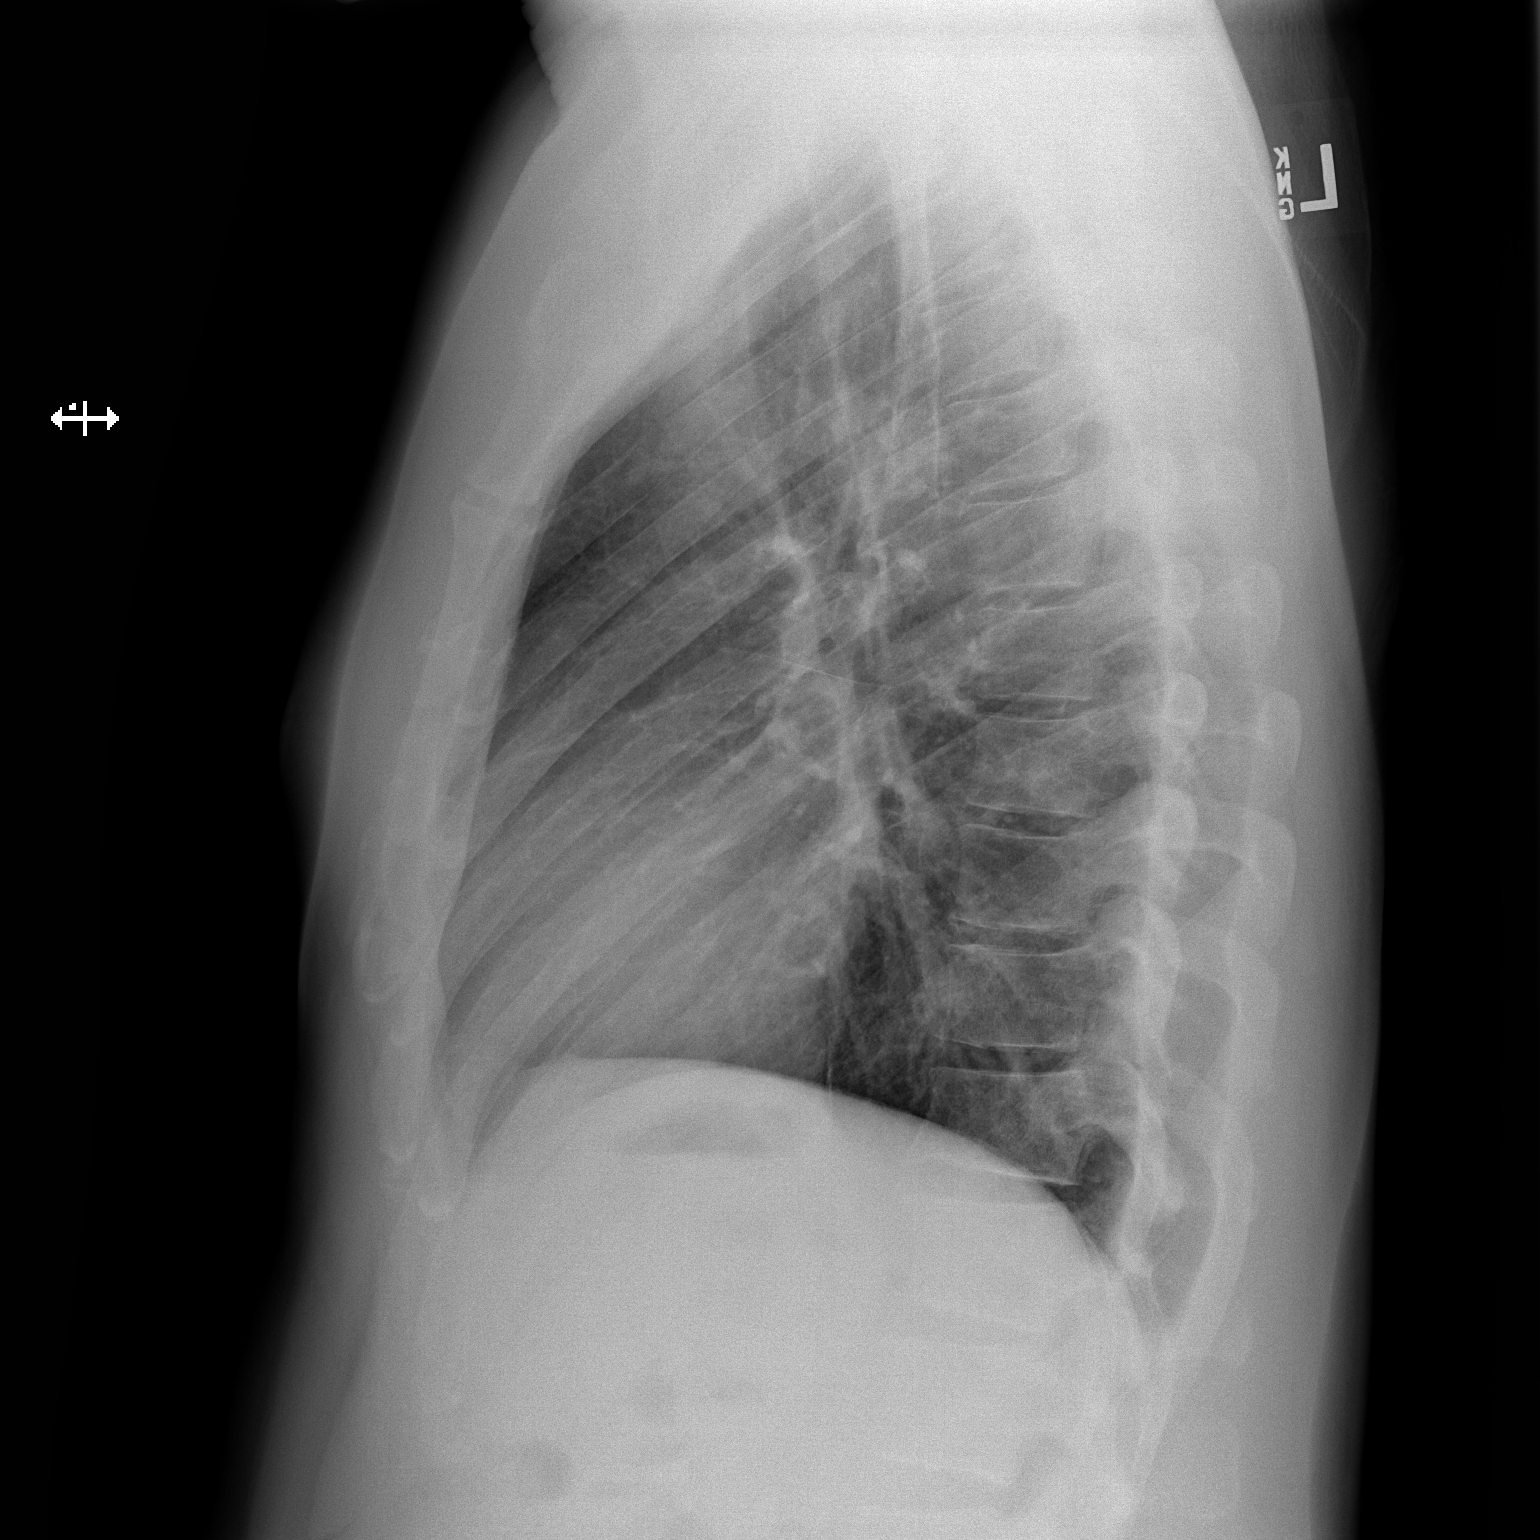

[2 of 2 positions shown; findings below may reference images not displayed]

FINDINGS: The cardiomediastinal contours are normal. Probable apical
emphysema. Pulmonary vasculature is normal. No consolidation,
pleural effusion, or pneumothorax. No acute osseous abnormalities
are seen.
IMPRESSION: No acute pulmonary process.

## 2021-07-12 ENCOUNTER — Other Ambulatory Visit: Payer: Self-pay

## 2021-07-12 ENCOUNTER — Ambulatory Visit (HOSPITAL_COMMUNITY)
Admission: EM | Admit: 2021-07-12 | Discharge: 2021-07-12 | Disposition: A | Discharge status: Home / Self Care | Payer: No Payment, Other | Attending: Psychiatry | Admitting: Psychiatry

## 2021-07-12 DIAGNOSIS — F419 Anxiety disorder, unspecified: Secondary | ICD-10-CM | POA: Diagnosis not present

## 2021-07-12 DIAGNOSIS — Z5900 Homelessness unspecified: Secondary | ICD-10-CM | POA: Diagnosis not present

## 2021-07-12 DIAGNOSIS — F330 Major depressive disorder, recurrent, mild: Secondary | ICD-10-CM | POA: Diagnosis not present

## 2021-07-12 NOTE — Discharge Instructions (Addendum)
Please contact one of the following facilities to start medication management and therapy services:   [facility] ([place])  [place], [place]  [address] Phone: [phone]   [facility]  [address], [place] [address] Phone: [phone]   [facility]  [address]  [place], [place] [address]   [facility]  [address], [place] [address] Phone: [place], please go to:  [facility]  [address] [place],  [address] [phone]

## 2021-07-12 NOTE — BH Assessment (Signed)
Patient is a 50 year old male that presents this date requesting assistance with ongoing substance abuse issues. Patient denies any S/I, H/I or AVH. Patient is brought in voluntary to [facility] by [facility]. Patient states he woke up this morning and was "tired of living this way" referring to his ongoing drug use. Patient states he uses crack cocaine in various amounts one to two times a week or "whenever he can get it." Patient also reports ongoing Cannabis and alcohol use although is vague in reference to amounts used or timeframe. Patient denies any current withdrawals. Patient denies any OP care at this time or is prescribed any medications. Patient per chart review has a history of depression and has been seen in 2018 when he presented to [facility] with similar symptoms. Patient denies access to firearms or current legal charges. Patient denies any history of abuse. Patient is currently homeless. Patient per Chana Bode NP recommends patient be discharged and be provided with resources to assist with ongoing needs. This Probation officer discussed multiple treatment options to include a year program at [facility] which the patient declined. Patient is contracting for safety and is requesting to be discharged with a bus pass.

## 2021-07-12 NOTE — ED Provider Notes (Signed)
[facility] Medical Screening Exam  Patient Name: Johnathan Baker MRN: [id number] Date of Evaluation: 07/12/21 Chief Complaint:   Diagnosis:  Final diagnoses:  Mild episode of recurrent major depressive disorder ([place])  Homeless    History of Present illness: Johnathan Baker is a 50 y.o. male patient presented to [facility] as a walk in voluntarily accompanied by [employer]  with complaints of  "I want to get off the streets, I want a change.  Johnathan Baker, 50 y.o., male patient seen face to face by this provider, consulted with Dr. Serafina Mitchell; and chart reviewed on 07/12/21.  On evaluation [name] reports he has been homeless for over 10 years. Reports he uses crack cocaine and alcohol. He did not answer how long he has been using. He was vague on the amount he intakes, but states roughly 40-24 ounces per day. He was vague in most of his responses. Appears to be more concerned with housing and being homeless. States he has been to [facility]. He has had Inpatient psychiatric admissions in the past for depression and substance abuse. Last admission was [facility] in [facility] 12/2020.   During evaluation Johnathan Baker is in sitting position in no acute distress. He is disheveled and makes fleeting eye contact. His speech is clear, coherent and normal rate and tone.  He is alert/oriented x 4 and cooperative. He endorses depression with congruent affect. He is anxious. Reports he sleeps 4 hours per night. Denies any concerns with appetite. His thought process is coherent and relevant; There is no indication that he is currently responding to internal/external stimuli or experiencing delusional thought content; and he has denied suicidal/self-harm/homicidal ideation. Denies any plan for suicide, intent or access to firearms/weapons. He is vague when answering if he has auditory/visual hallucinations (AVH). States he hears his own voice saying "it is going to be ok". Denies visual hallucinations.   Patient  initially requested substance abuse treatment but after waiting he requested to leave. States he needs to go speak with his boss about a leave. Earlier he stated he only worked off and on. Resources for shelters and substance abuse treatment centers were provided.    At this time Johnathan Baker is educated and verbalizes understanding of mental health resources and other crisis services in the community. He is instructed to call 911 and present to the nearest emergency room should he experience any suicidal/homicidal ideation, auditory/visual/hallucinations, or detrimental worsening of his mental health condition.       Psychiatric Specialty Exam  Presentation  General Appearance:Disheveled  Eye Contact:Fleeting  Speech:Clear and Coherent; Normal Rate  Speech Volume:Normal  Handedness:Right   Mood and Affect  Mood:Depressed; Anxious  Affect:Congruent   Thought Process  Thought Processes:Coherent  Descriptions of Associations:Intact  Orientation:Full (Time, Place and Person)  Thought Content:Logical    Hallucinations:None  Ideas of Reference:None  Suicidal Thoughts:No  Homicidal Thoughts:No   Sensorium  Memory:Immediate Good; Recent Good; Remote Good  Judgment:Fair  Insight:Fair   Executive Functions  Concentration:Good  Attention Span:Good  [place]  Language:Good   Psychomotor Activity  Psychomotor Activity:Normal   Assets  Assets:Communication Skills; Desire for Improvement; Physical Health; Resilience   Sleep  Sleep:Poor  Number of hours: 4   No data recorded  Physical Exam: Physical Exam Vitals and nursing note reviewed.  Constitutional:      Appearance: Normal appearance. He is well-developed.  HENT:     Head: Normocephalic and atraumatic.  Eyes:     General:  Right eye: No discharge.        Left eye: No discharge.     Conjunctiva/sclera: Conjunctivae normal.  Cardiovascular:     Rate and  Rhythm: Normal rate.     Heart sounds: No murmur heard. Pulmonary:     Effort: Pulmonary effort is normal. No respiratory distress.  Musculoskeletal:        General: Normal range of motion.     Cervical back: Normal range of motion.  Skin:    Coloration: Skin is not jaundiced or pale.  Neurological:     Mental Status: He is alert and oriented to person, place, and time.  Psychiatric:        Attention and Perception: Attention and perception normal.        Mood and Affect: Mood is anxious and depressed.        Speech: Speech normal.        Behavior: Behavior is cooperative.        Thought Content: Thought content normal.        Cognition and Memory: Cognition normal.        Judgment: Judgment is impulsive.   Review of Systems  Constitutional: Negative.  Negative for chills and fever.  HENT: Negative.  Negative for hearing loss.   Eyes: Negative.   Respiratory: Negative.  Negative for cough.   Cardiovascular: Negative.   Musculoskeletal: Negative.   Skin: Negative.   Neurological: Negative.   Psychiatric/Behavioral:  Positive for depression. The patient is nervous/anxious.   Blood pressure (!) 141/99, pulse 65, temperature 97.9 F (36.6 C), temperature source Oral, resp. rate 18, SpO2 100 %. There is no height or weight on file to calculate BMI.  Musculoskeletal: Strength & Muscle Tone: within normal limits Gait & Station: normal Patient leans: N/A   [place] MSE Discharge Disposition for Follow up and Recommendations: Based on my evaluation the patient does not appear to have an emergency medical condition and can be discharged with resources and follow up care in outpatient services for Substance Abuse Intensive Outpatient Program.  Resources for shelters and substance abuse treatment centers were provided.   Resources for [employer] and Wellness provided.   No evidence of imminent risk to self or others at present.    Patient does not meet criteria for  psychiatric inpatient admission. Discussed crisis plan, support from social network, calling 911, coming to the Emergency Department, and calling Suicide Hotline.    [name], NP 07/12/2021, 11:14 AM

## 2021-08-03 ENCOUNTER — Ambulatory Visit: Payer: Self-pay | Admitting: Family Medicine

## 2022-01-21 ENCOUNTER — Encounter: Payer: Self-pay | Admitting: Gastroenterology

## 2022-02-07 ENCOUNTER — Ambulatory Visit (AMBULATORY_SURGERY_CENTER): Payer: 59 | Admitting: *Deleted

## 2022-02-07 VITALS — Ht 77.0 in | Wt 256.0 lb

## 2022-02-07 DIAGNOSIS — Z1211 Encounter for screening for malignant neoplasm of colon: Secondary | ICD-10-CM

## 2022-02-07 MED ORDER — PEG 3350-KCL-NA BICARB-NACL 420 G PO SOLR
4000.0000 mL | Freq: Once | ORAL | 0 refills | Status: AC
Start: 2022-02-07 — End: 2022-02-07

## 2022-02-07 NOTE — Progress Notes (Signed)
Patient's pre-visit was done today over the phone with the patient. Name,DOB and address verified. Patient denies any allergies to Eggs and Soy. Patient denies any past surgeries. Patient is not taking any diet pills or blood thinners. No home Oxygen. Insurance confirmed with patient. ? ?Prep instructions sent to pt's MyChart (if available) and mailed to pt-pt is aware. Patient understands to call us back with any questions or concerns. Patient is aware of our care-partner policy and [id number] safety protocol.  ? ?EMMI education assigned to the patient for the procedure, sent to [place].  ? ?

## 2022-02-20 ENCOUNTER — Encounter: Payer: Self-pay | Admitting: Certified Registered Nurse Anesthetist

## 2022-03-02 ENCOUNTER — Encounter: Payer: Self-pay | Admitting: Gastroenterology

## 2022-03-02 ENCOUNTER — Ambulatory Visit (AMBULATORY_SURGERY_CENTER): Payer: 59 | Admitting: Gastroenterology

## 2022-03-02 VITALS — BP 142/97 | HR 72 | Temp 98.6°F | Resp 16 | Ht 77.0 in | Wt 256.0 lb

## 2022-03-02 DIAGNOSIS — Z1211 Encounter for screening for malignant neoplasm of colon: Secondary | ICD-10-CM | POA: Diagnosis present

## 2022-03-02 DIAGNOSIS — D125 Benign neoplasm of sigmoid colon: Secondary | ICD-10-CM

## 2022-03-02 DIAGNOSIS — K635 Polyp of colon: Secondary | ICD-10-CM

## 2022-03-02 DIAGNOSIS — K5730 Diverticulosis of large intestine without perforation or abscess without bleeding: Secondary | ICD-10-CM

## 2022-03-02 DIAGNOSIS — D127 Benign neoplasm of rectosigmoid junction: Secondary | ICD-10-CM

## 2022-03-02 DIAGNOSIS — Z800 Family history of malignant neoplasm of digestive organs: Secondary | ICD-10-CM | POA: Diagnosis not present

## 2022-03-02 DIAGNOSIS — K641 Second degree hemorrhoids: Secondary | ICD-10-CM

## 2022-03-02 MED ORDER — SODIUM CHLORIDE 0.9 % IV SOLN
500.0000 mL | Freq: Once | INTRAVENOUS | Status: DC
Start: 2022-03-02 — End: 2022-03-02

## 2022-03-02 NOTE — Progress Notes (Signed)
1045 Robinul 0.2 mg IV given due large amount of secretions upon assessment.  Continuously coughing during scope.  MD made aware, vss  ?

## 2022-03-02 NOTE — Op Note (Signed)
[facility] ?Patient Name: Johnathan Baker ?Procedure Date: 03/02/2022 10:08 AM ?MRN: [id number] ?Endoscopist: Gerrit Heck , MD ?Age: 51 ?Referring MD:  ?Date of Birth: 02/01/1971 ?Gender: Male ?Account #: [id number] ?Procedure:                Colonoscopy ?Indications:              Screening in patient at increased risk: Colorectal  ?                          cancer in mother at age 23. This is the patient's  ?                          first colonoscopy. He is otherwise without GI  ?                          symptoms. ?Medicines:                Monitored Anesthesia Care ?Procedure:                Pre-Anesthesia Assessment: ?                          - Prior to the procedure, a History and Physical  ?                          was performed, and patient medications and  ?                          allergies were reviewed. The patient's tolerance of  ?                          previous anesthesia was also reviewed. The risks  ?                          and benefits of the procedure and the sedation  ?                          options and risks were discussed with the patient.  ?                          All questions were answered, and informed consent  ?                          was obtained. Prior Anticoagulants: The patient has  ?                          taken no previous anticoagulant or antiplatelet  ?                          agents. ASA Grade Assessment: II - A patient with  ?                          mild systemic disease. After reviewing the risks  ?  and benefits, the patient was deemed in  ?                          satisfactory condition to undergo the procedure. ?                          After obtaining informed consent, the colonoscope  ?                          was passed under direct vision. Throughout the  ?                          procedure, the patient's blood pressure, pulse, and  ?                          oxygen saturations were monitored continuously. The  ?                           Olympus CF-HQ190L (Serial# [id number]) Colonoscope was  ?                          introduced through the anus and advanced to the the  ?                          terminal ileum. The colonoscopy was performed  ?                          without difficulty. The patient tolerated the  ?                          procedure well. The quality of the bowel  ?                          preparation was good. The terminal ileum, ileocecal  ?                          valve, appendiceal orifice, and rectum were  ?                          photographed. ?Scope In: 10:29:36 AM ?Scope Out: 10:49:11 AM ?Scope Withdrawal Time: 0 hours 15 minutes 20 seconds  ?Total Procedure Duration: 0 hours 19 minutes 35 seconds  ?Findings:                 Hemorrhoids were found on perianal exam. ?                          Multiple sessile polyps were found in the  ?                          recto-sigmoid colon and distal sigmoid colon. The  ?                          polyps were 1 to 3 mm in size. Nine of these polyps  ?  were removed with a cold snare. Resection and  ?                          retrieval were complete. Estimated blood loss was  ?                          minimal. ?                          A few small-mouthed diverticula were found in the  ?                          descending colon and transverse colon. ?                          Non-bleeding internal hemorrhoids were found during  ?                          retroflexion. The hemorrhoids were small and Grade  ?                          II (internal hemorrhoids that prolapse but reduce  ?                          spontaneously). ?                          The terminal ileum appeared normal. ?Complications:            No immediate complications. ?Estimated Blood Loss:     Estimated blood loss was minimal. ?Impression:               - Hemorrhoids found on perianal exam. ?                          - Multiple 1 to 3 mm polyps at the  recto-sigmoid  ?                          colon and in the distal sigmoid colon, removed with  ?                          a cold snare. Resected and retrieved. ?                          - Diverticulosis in the descending colon and in the  ?                          transverse colon. ?                          - Non-bleeding internal hemorrhoids. ?                          - The examined portion of the ileum was normal. ?Recommendation:           - Patient has a contact number available for  ?  emergencies. The signs and symptoms of potential  ?                          delayed complications were discussed with the  ?                          patient. Return to normal activities tomorrow.  ?                          Written discharge instructions were provided to the  ?                          patient. ?                          - Resume previous diet. ?                          - Continue present medications. ?                          - Await pathology results. ?                          - Due to family history, recommend repeat  ?                          colonoscopy in 5 years for continued surveillance,  ?                          or sooner if needed based on pathology results. ?                          - Return to GI office PRN. ?Gerrit Heck, MD ?03/02/2022 10:55:53 AM ?

## 2022-03-02 NOTE — Progress Notes (Signed)
? ?GASTROENTEROLOGY PROCEDURE H&P NOTE  ? ?Primary Care Physician: ?Benito Mccreedy, MD ? ? ? ?Reason for Procedure:  Colon Cancer screening ? ?Plan:    Colonoscopy ? ?Patient is appropriate for endoscopic procedure(s) in the ambulatory ([facility]) setting. ? ?The nature of the procedure, as well as the risks, benefits, and alternatives were carefully and thoroughly reviewed with the patient. Ample time for discussion and questions allowed. The patient understood, was satisfied, and agreed to proceed.  ? ? ? ?HPI: ?Johnathan Baker is a 51 y.o. male who presents for colonoscopy for routine Colon Cancer screening.  No active GI symptoms.   ? ?Past Medical History:  ?Diagnosis Date  ? Back pain   ? Depression   ? Drug addiction ([place])   ? Homelessness   ? Hypertension   ? Suicidal ideation   ? Thyroid disease   ? ? ?Past Surgical History:  ?Procedure Laterality Date  ? NO PAST SURGERIES    ? ? ?Prior to Admission medications   ?Medication Sig Start Date End Date Taking? Authorizing Provider  ?amLODipine (NORVASC) 10 MG tablet Take 10 mg by mouth daily. 12/20/21  Yes [provider]  ?levothyroxine (SYNTHROID) 150 MCG tablet Take 150 mcg by mouth daily. 01/25/22  Yes [provider]  ?Multiple Vitamin (MULTIVITAMIN) tablet Take 1 tablet by mouth daily.   Yes [provider]  ? ? ?Current Outpatient Medications  ?Medication Sig Dispense Refill  ? amLODipine (NORVASC) 10 MG tablet Take 10 mg by mouth daily.    ? levothyroxine (SYNTHROID) 150 MCG tablet Take 150 mcg by mouth daily.    ? Multiple Vitamin (MULTIVITAMIN) tablet Take 1 tablet by mouth daily.    ? ?Current Facility-Administered Medications  ?Medication Dose Route Frequency Provider Last Rate Last Admin  ? 0.9 %  sodium chloride infusion  500 mL Intravenous Once Ebb Carelock V, DO      ? ? ?Allergies as of 03/02/2022  ? (No Known Allergies)  ? ? ?Family History  ?Problem Relation Age of Onset  ? Colon cancer Neg Hx   ? ? ?Social History   ? ?Socioeconomic History  ? Marital status: Married  ?  Spouse name: Not on file  ? Number of children: Not on file  ? Years of education: Not on file  ? Highest education level: Not on file  ?Occupational History  ? Not on file  ?Tobacco Use  ? Smoking status: Every Day  ?  Packs/day: 0.25  ?  Types: Cigarettes  ? Smokeless tobacco: Never  ?Vaping Use  ? Vaping Use: Never used  ?Substance and Sexual Activity  ? Alcohol use: Yes  ?  Comment: 2-24 oz beers weekly per pt  ? Drug use: Yes  ?  Types: Cocaine, Marijuana  ?  Comment: THC-1 gram every 2 weeks per pt  ? Sexual activity: Yes  ?Other Topics Concern  ? Not on file  ?Social History Narrative  ? Not on file  ? ?Social Determinants of Health  ? ?Financial Resource Strain: Not on file  ?Food Insecurity: Not on file  ?Transportation Needs: Not on file  ?Physical Activity: Not on file  ?Stress: Not on file  ?Social Connections: Not on file  ?Intimate Partner Violence: Not on file  ? ? ?Physical Exam: ?Vital signs in last 24 hours: ?'@BP'$  120/70   Pulse 67   Temp 98.6 ?F (37 ?C)   Ht '6\' 5"'$  (1.956 m)   Wt 256 lb (116.1 kg)   SpO2  100%   BMI 30.36 kg/m?  ?GEN: NAD ?EYE: Sclerae anicteric ?ENT: MMM ?CV: Non-tachycardic ?Pulm: CTA b/l ?GI: Soft, NT/ND ?NEURO:  Alert & Oriented x 3 ? ? ?Gerrit Heck, DO ?[facility] ? ? ?03/02/2022 10:16 AM ? ?

## 2022-03-02 NOTE — Patient Instructions (Signed)
Handout on polyps, hemorrhoids, and diverticulosis given. ? ?YOU HAD AN ENDOSCOPIC PROCEDURE TODAY AT [facility]:   Refer to the procedure report that was given to you for any specific questions about what was found during the examination.  If the procedure report does not answer your questions, please call your gastroenterologist to clarify.  If you requested that your care partner not be given the details of your procedure findings, then the procedure report has been included in a sealed envelope for you to review at your convenience later. ? ?YOU SHOULD EXPECT: Some feelings of bloating in the abdomen. Passage of more gas than usual.  Walking can help get rid of the air that was put into your GI tract during the procedure and reduce the bloating. If you had a lower endoscopy (such as a colonoscopy or flexible sigmoidoscopy) you may notice spotting of blood in your stool or on the toilet paper. If you underwent a bowel prep for your procedure, you may not have a normal bowel movement for a few days. ? ?Please Note:  You might notice some irritation and congestion in your nose or some drainage.  This is from the oxygen used during your procedure.  There is no need for concern and it should clear up in a day or so. ? ?SYMPTOMS TO REPORT IMMEDIATELY: ? ?Following lower endoscopy (colonoscopy or flexible sigmoidoscopy): ? Excessive amounts of blood in the stool ? Significant tenderness or worsening of abdominal pains ? Swelling of the abdomen that is new, acute ? Fever of 100?F or higher ? ?For urgent or emergent issues, a gastroenterologist can be reached at any hour by calling [phone]. ?Do not use MyChart messaging for urgent concerns.  ? ? ?DIET:  We do recommend a small meal at first, but then you may proceed to your regular diet.  Drink plenty of fluids but you should avoid alcoholic beverages for 24 hours. ? ?ACTIVITY:  You should plan to take it easy for the rest of today and you should  NOT DRIVE or use heavy machinery until tomorrow (because of the sedation medicines used during the test).   ? ?FOLLOW UP: ?Our staff will call the number listed on your records 48-72 hours following your procedure to check on you and address any questions or concerns that you may have regarding the information given to you following your procedure. If we do not reach you, we will leave a message.  We will attempt to reach you two times.  During this call, we will ask if you have developed any symptoms of COVID 19. If you develop any symptoms (ie: fever, flu-like symptoms, shortness of breath, cough etc.) before then, please call [phone].  If you test positive for Covid 19 in the 2 weeks post procedure, please call and report this information to [place].   ? ?If any biopsies were taken you will be contacted by phone or by letter within the next 1-3 weeks.  Please call us at [phone] if you have not heard about the biopsies in 3 weeks.  ? ? ?SIGNATURES/CONFIDENTIALITY: ?You and/or your care partner have signed paperwork which will be entered into your electronic medical record.  These signatures attest to the fact that that the information above on your After Visit Summary has been reviewed and is understood.  Full responsibility of the confidentiality of this discharge information lies with you and/or your care-partner.  ?

## 2022-03-02 NOTE — Progress Notes (Signed)
Report given to PACU, vss 

## 2022-03-02 NOTE — Progress Notes (Signed)
Called to room to assist during endoscopic procedure.  Patient ID and intended procedure confirmed with present staff. Received instructions for my participation in the procedure from the performing physician.  

## 2022-03-02 NOTE — Progress Notes (Signed)
Pt's states no medical or surgical changes since previsit or office visit. 

## 2022-03-04 ENCOUNTER — Telehealth: Payer: Self-pay

## 2022-03-04 NOTE — Telephone Encounter (Signed)
?  Follow up Call- ? ? ?  03/02/2022  ?  9:32 AM  ?Call back number  ?Post procedure Call Back phone  # [phone]  ?Permission to leave phone message Yes  ?  ? ?Patient questions: ? ?Do you have a fever, pain , or abdominal swelling? No. ?Pain Score  0 * ? ?Have you tolerated food without any problems? Yes.   ? ?Have you been able to return to your normal activities? Yes.   ? ?Do you have any questions about your discharge instructions: ?Diet   No. ?Medications  No. ?Follow up visit  No. ? ?Do you have questions or concerns about your Care? No. ? ?Actions: ?* If pain score is 4 or above: ?No action needed, pain <4. ? ? ?

## 2022-03-10 ENCOUNTER — Encounter: Payer: Self-pay | Admitting: Gastroenterology
